# Patient Record
Sex: Female | Born: 1950 | Race: Black or African American | Hispanic: No | Marital: Single | Smoking: Never smoker
Health system: Southern US, Community
[De-identification: ages and names within clinical notes are randomized; demographics above are authoritative.]

## PROBLEM LIST (undated history)

## (undated) DIAGNOSIS — J45909 Unspecified asthma, uncomplicated: Secondary | ICD-10-CM

## (undated) DIAGNOSIS — E119 Type 2 diabetes mellitus without complications: Secondary | ICD-10-CM

## (undated) DIAGNOSIS — D8689 Sarcoidosis of other sites: Secondary | ICD-10-CM

## (undated) DIAGNOSIS — I639 Cerebral infarction, unspecified: Secondary | ICD-10-CM

## (undated) HISTORY — DX: Cerebral infarction, unspecified: I63.9

## (undated) HISTORY — DX: Sarcoidosis of other sites: D86.89

---

## 1996-02-04 HISTORY — PX: PARTIAL HYSTERECTOMY: SHX80

## 2013-02-03 HISTORY — PX: REPLACEMENT TOTAL KNEE: SUR1224

## 2015-04-10 ENCOUNTER — Encounter (HOSPITAL_COMMUNITY): Payer: Self-pay | Admitting: Emergency Medicine

## 2015-04-10 ENCOUNTER — Emergency Department (HOSPITAL_COMMUNITY): Payer: Medicare (Managed Care)

## 2015-04-10 ENCOUNTER — Inpatient Hospital Stay (HOSPITAL_COMMUNITY)
Admission: EM | Admit: 2015-04-10 | Discharge: 2015-04-14 | DRG: 871 | Disposition: A | Discharge status: Home / Self Care | Payer: Medicare (Managed Care) | Attending: Internal Medicine | Admitting: Internal Medicine

## 2015-04-10 DIAGNOSIS — B9620 Unspecified Escherichia coli [E. coli] as the cause of diseases classified elsewhere: Secondary | ICD-10-CM | POA: Diagnosis present

## 2015-04-10 DIAGNOSIS — E119 Type 2 diabetes mellitus without complications: Secondary | ICD-10-CM | POA: Diagnosis present

## 2015-04-10 DIAGNOSIS — J45909 Unspecified asthma, uncomplicated: Secondary | ICD-10-CM | POA: Diagnosis present

## 2015-04-10 DIAGNOSIS — R110 Nausea: Secondary | ICD-10-CM | POA: Diagnosis present

## 2015-04-10 DIAGNOSIS — I9589 Other hypotension: Secondary | ICD-10-CM

## 2015-04-10 DIAGNOSIS — Z7952 Long term (current) use of systemic steroids: Secondary | ICD-10-CM

## 2015-04-10 DIAGNOSIS — R740 Nonspecific elevation of levels of transaminase and lactic acid dehydrogenase [LDH]: Secondary | ICD-10-CM

## 2015-04-10 DIAGNOSIS — J1100 Influenza due to unidentified influenza virus with unspecified type of pneumonia: Secondary | ICD-10-CM | POA: Diagnosis present

## 2015-04-10 DIAGNOSIS — R7401 Elevation of levels of liver transaminase levels: Secondary | ICD-10-CM

## 2015-04-10 DIAGNOSIS — A4189 Other specified sepsis: Secondary | ICD-10-CM | POA: Diagnosis present

## 2015-04-10 DIAGNOSIS — I10 Essential (primary) hypertension: Secondary | ICD-10-CM | POA: Diagnosis present

## 2015-04-10 DIAGNOSIS — N390 Urinary tract infection, site not specified: Secondary | ICD-10-CM | POA: Diagnosis present

## 2015-04-10 DIAGNOSIS — N179 Acute kidney failure, unspecified: Secondary | ICD-10-CM | POA: Diagnosis present

## 2015-04-10 DIAGNOSIS — E785 Hyperlipidemia, unspecified: Secondary | ICD-10-CM | POA: Diagnosis present

## 2015-04-10 DIAGNOSIS — R0602 Shortness of breath: Secondary | ICD-10-CM | POA: Diagnosis present

## 2015-04-10 DIAGNOSIS — A419 Sepsis, unspecified organism: Secondary | ICD-10-CM | POA: Diagnosis not present

## 2015-04-10 DIAGNOSIS — Z6841 Body Mass Index (BMI) 40.0 and over, adult: Secondary | ICD-10-CM

## 2015-04-10 DIAGNOSIS — R197 Diarrhea, unspecified: Secondary | ICD-10-CM | POA: Diagnosis present

## 2015-04-10 DIAGNOSIS — J4521 Mild intermittent asthma with (acute) exacerbation: Secondary | ICD-10-CM | POA: Diagnosis not present

## 2015-04-10 DIAGNOSIS — J189 Pneumonia, unspecified organism: Secondary | ICD-10-CM | POA: Diagnosis present

## 2015-04-10 DIAGNOSIS — R410 Disorientation, unspecified: Secondary | ICD-10-CM

## 2015-04-10 DIAGNOSIS — I959 Hypotension, unspecified: Secondary | ICD-10-CM | POA: Diagnosis present

## 2015-04-10 DIAGNOSIS — Z7984 Long term (current) use of oral hypoglycemic drugs: Secondary | ICD-10-CM

## 2015-04-10 DIAGNOSIS — J45901 Unspecified asthma with (acute) exacerbation: Secondary | ICD-10-CM | POA: Diagnosis present

## 2015-04-10 DIAGNOSIS — G9340 Encephalopathy, unspecified: Secondary | ICD-10-CM | POA: Diagnosis present

## 2015-04-10 HISTORY — DX: Unspecified asthma, uncomplicated: J45.909

## 2015-04-10 HISTORY — DX: Type 2 diabetes mellitus without complications: E11.9

## 2015-04-10 LAB — CBC WITH DIFFERENTIAL/PLATELET
Basophils Absolute: 0.1 10*3/uL (ref 0.0–0.1)
Basophils Relative: 1 %
EOS ABS: 0.1 10*3/uL (ref 0.0–0.7)
EOS PCT: 2 %
HCT: 37.8 % (ref 36.0–46.0)
Hemoglobin: 12.6 g/dL (ref 12.0–15.0)
LYMPHS PCT: 24 %
Lymphs Abs: 1.6 10*3/uL (ref 0.7–4.0)
MCH: 30.6 pg (ref 26.0–34.0)
MCHC: 33.3 g/dL (ref 30.0–36.0)
MCV: 91.7 fL (ref 78.0–100.0)
MONO ABS: 1 10*3/uL (ref 0.1–1.0)
Monocytes Relative: 14 %
NEUTROS PCT: 59 %
Neutro Abs: 4 10*3/uL (ref 1.7–7.7)
Platelets: 223 10*3/uL (ref 150–400)
RBC: 4.12 MIL/uL (ref 3.87–5.11)
RDW: 12.9 % (ref 11.5–15.5)
WBC: 6.8 10*3/uL (ref 4.0–10.5)

## 2015-04-10 LAB — LACTIC ACID, PLASMA: Lactic Acid, Venous: 1.7 mmol/L (ref 0.5–2.0)

## 2015-04-10 LAB — URINALYSIS, ROUTINE W REFLEX MICROSCOPIC
BILIRUBIN URINE: NEGATIVE
GLUCOSE, UA: NEGATIVE mg/dL
HGB URINE DIPSTICK: NEGATIVE
Ketones, ur: NEGATIVE mg/dL
Nitrite: NEGATIVE
PH: 7 (ref 5.0–8.0)
Protein, ur: NEGATIVE mg/dL
SPECIFIC GRAVITY, URINE: 1.01 (ref 1.005–1.030)

## 2015-04-10 LAB — COMPREHENSIVE METABOLIC PANEL
ALT: 72 U/L — AB (ref 14–54)
AST: 78 U/L — ABNORMAL HIGH (ref 15–41)
Albumin: 3.5 g/dL (ref 3.5–5.0)
Alkaline Phosphatase: 159 U/L — ABNORMAL HIGH (ref 38–126)
Anion gap: 6 (ref 5–15)
BUN: 15 mg/dL (ref 6–20)
CALCIUM: 7.7 mg/dL — AB (ref 8.9–10.3)
CHLORIDE: 101 mmol/L (ref 101–111)
CO2: 28 mmol/L (ref 22–32)
CREATININE: 1.68 mg/dL — AB (ref 0.44–1.00)
GFR, EST AFRICAN AMERICAN: 36 mL/min — AB (ref >60)
GFR, EST NON AFRICAN AMERICAN: 31 mL/min — AB (ref >60)
Glucose, Bld: 91 mg/dL (ref 65–99)
Potassium: 3.6 mmol/L (ref 3.5–5.1)
Sodium: 135 mmol/L (ref 135–145)
Total Bilirubin: 1.1 mg/dL (ref 0.3–1.2)
Total Protein: 6.7 g/dL (ref 6.5–8.1)

## 2015-04-10 LAB — PROTIME-INR
INR: 1.13 (ref 0.00–1.49)
PROTHROMBIN TIME: 14.7 s (ref 11.6–15.2)

## 2015-04-10 LAB — I-STAT CG4 LACTIC ACID, ED
LACTIC ACID, VENOUS: 1.22 mmol/L (ref 0.5–2.0)
LACTIC ACID, VENOUS: 1.58 mmol/L (ref 0.5–2.0)

## 2015-04-10 LAB — I-STAT TROPONIN, ED: Troponin i, poc: 0.01 ng/mL (ref 0.00–0.08)

## 2015-04-10 LAB — URINE MICROSCOPIC-ADD ON

## 2015-04-10 LAB — PROCALCITONIN: Procalcitonin: 0.35 ng/mL

## 2015-04-10 LAB — APTT: aPTT: 36 seconds (ref 24–37)

## 2015-04-10 LAB — BRAIN NATRIURETIC PEPTIDE: B Natriuretic Peptide: 78.8 pg/mL (ref 0.0–100.0)

## 2015-04-10 LAB — CBG MONITORING, ED: Glucose-Capillary: 75 mg/dL (ref 65–99)

## 2015-04-10 MED ORDER — DEXTROSE 5 % IV SOLN
500.0000 mg | Freq: Once | INTRAVENOUS | Status: AC
  Administered 2015-04-10: 500 mg via INTRAVENOUS
  Filled 2015-04-10: qty 500

## 2015-04-10 MED ORDER — SODIUM CHLORIDE 0.9 % IV BOLUS (SEPSIS)
1000.0000 mL | INTRAVENOUS | Status: AC
  Administered 2015-04-10 (×2): 1000 mL via INTRAVENOUS

## 2015-04-10 MED ORDER — ALBUTEROL SULFATE (2.5 MG/3ML) 0.083% IN NEBU
2.5000 mg | INHALATION_SOLUTION | Freq: Four times a day (QID) | RESPIRATORY_TRACT | Status: DC
  Administered 2015-04-10: 2.5 mg via RESPIRATORY_TRACT
  Filled 2015-04-10: qty 3

## 2015-04-10 MED ORDER — DEXTROSE 5 % IV SOLN
500.0000 mg | INTRAVENOUS | Status: DC
  Administered 2015-04-11 – 2015-04-13 (×3): 500 mg via INTRAVENOUS
  Filled 2015-04-10 (×3): qty 500

## 2015-04-10 MED ORDER — DEXTROSE 5 % IV SOLN
1.0000 g | INTRAVENOUS | Status: DC
  Administered 2015-04-11 – 2015-04-13 (×3): 1 g via INTRAVENOUS
  Filled 2015-04-10 (×3): qty 10

## 2015-04-10 MED ORDER — SODIUM CHLORIDE 0.9 % IV BOLUS (SEPSIS)
500.0000 mL | Freq: Once | INTRAVENOUS | Status: AC
  Administered 2015-04-10: 500 mL via INTRAVENOUS

## 2015-04-10 MED ORDER — ALBUTEROL SULFATE (2.5 MG/3ML) 0.083% IN NEBU
2.5000 mg | INHALATION_SOLUTION | RESPIRATORY_TRACT | Status: DC | PRN
  Administered 2015-04-11: 2.5 mg via RESPIRATORY_TRACT

## 2015-04-10 MED ORDER — SODIUM CHLORIDE 0.9 % IV BOLUS (SEPSIS)
500.0000 mL | INTRAVENOUS | Status: AC
  Administered 2015-04-10: 500 mL via INTRAVENOUS

## 2015-04-10 MED ORDER — DEXTROSE 5 % IV SOLN
1.0000 g | Freq: Once | INTRAVENOUS | Status: AC
  Administered 2015-04-10: 1 g via INTRAVENOUS
  Filled 2015-04-10: qty 10

## 2015-04-10 MED ORDER — ACETAMINOPHEN 325 MG PO TABS
650.0000 mg | ORAL_TABLET | Freq: Once | ORAL | Status: AC
  Administered 2015-04-10: 650 mg via ORAL
  Filled 2015-04-10: qty 2

## 2015-04-10 NOTE — ED Notes (Signed)
Per EMs. Pt from home. Reports flu-like symptoms, n/v/d and cough for the past 4 days. Pt also had a fall this am when walking back from the bathroom. Pt denied any complaints with EMS, but was disoriented to time, which was unusual for her. Pt had expiratory wheezing with EMS. EMS gave albuterol and atrovent neb along with 500 NS.

## 2015-04-10 NOTE — H&P (Signed)
[facility] Admission History and Physical       Darlene Stafford [id number] DOB: 1950-12-31 DOA: 04/10/2015  Referring physician: EDP PCP: No primary care provider on file.  Specialists:   Chief Complaint: Fever Chills Cough  HPI: Darlene Stafford is a 65 y.o. female with a history of Asthma, DM2 who presents to the ED with complaints of 4 days of SOB, Cough, Fevers,  Chills,  and Myalgias, and Chest Congestion and Wheezing.   She has also had Nausea Vomitng and Diarrhea She had a fever to 102.6 in the ED,  She was found to have a multifocal pneumonia on Chest X-ray.   She was placed on IV Rocephin and Azithromycin for CAP pneumonia and Flu studies were sent.     Review of Systems:    Constitutional: No Weight Loss, No Weight Gain, Night Sweats, +Fevers, Chills, Dizziness, + Myalgias, +Light Headedness, Fatigue, or Generalized Weakness HEENT: No Headaches, Difficulty Swallowing,Tooth/Dental Problems,Sore Throat,  No Sneezing, Rhinitis, Ear Ache, Nasal Congestion, or Post Nasal Drip,  Cardio-vascular:  No Chest pain, Orthopnea, PND, Edema in Lower Extremities, Anasarca, Dizziness, Palpitations  Resp: No Dyspnea, No DOE, No Productive Cough,  +Non-Productive Cough, No Hemoptysis, No Wheezing.    GI: No Heartburn, Indigestion, Abdominal Pain, +Nausea, Vomiting, Diarrhea, Constipation, Hematemesis, Hematochezia, Melena, Change in Bowel Habits,  Loss of Appetite  GU: No Dysuria, No Change in Color of Urine, No Urgency or Urinary Frequency, No Flank pain.  Musculoskeletal: No Joint Pain or Swelling, No Decreased Range of Motion, No Back Pain.  Neurologic: No Syncope, No Seizures, Muscle Weakness, Paresthesia, Vision Disturbance or Loss, No Diplopia, No Vertigo, No Difficulty Walking,  Skin: No Rash or Lesions. Psych: No Change in Mood or Affect, No Depression or Anxiety, No Memory loss, No Confusion, or Hallucinations   Past Medical History  Diagnosis Date  . Asthma   . Diabetes  mellitus without complication ([place])      History reviewed. No pertinent past surgical history.    Prior to Admission medications   Medication Sig Start Date End Date Taking? Authorizing Provider  albuterol (PROVENTIL HFA;VENTOLIN HFA) 108 (90 Base) MCG/ACT inhaler Inhale 2 puffs into the lungs 2 (two) times daily as needed for wheezing or shortness of breath.   Yes Historical Provider, MD  atorvastatin (LIPITOR) 40 MG tablet Take 40 mg by mouth daily.   Yes Historical Provider, MD  carvedilol (COREG) 12.5 MG tablet Take 12.5 mg by mouth 2 (two) times daily with a meal.   Yes Historical Provider, MD  escitalopram (LEXAPRO) 10 MG tablet Take 10 mg by mouth daily.   Yes Historical Provider, MD  LORazepam (ATIVAN) 0.5 MG tablet Take 0.5 mg by mouth 2 (two) times daily as needed for anxiety.   Yes Historical Provider, MD  predniSONE (DELTASONE) 5 MG tablet Take 5 mg by mouth daily with breakfast.   Yes Historical Provider, MD  sitaGLIPtin (JANUVIA) 100 MG tablet Take 100 mg by mouth daily.   Yes Historical Provider, MD  traMADol (ULTRAM) 50 MG tablet Take 50-100 mg by mouth every 6 (six) hours as needed (pain.).   Yes Historical Provider, MD     No Known Allergies     Social History:  reports that she has never smoked. She does not have any smokeless tobacco history on file. She reports that she does not drink alcohol or use illicit drugs.     History reviewed. No pertinent family history.     Physical Exam:  GEN:  Pleasant Obese  65 y.o. African American female examined and in no acute distress; cooperative with exam Filed Vitals:   04/10/15 1657 04/10/15 1721 04/10/15 2000 04/10/15 2046  BP:  184/99 100/56 100/56  Pulse:  104 91 93  Temp:      TempSrc:      Resp:  25 22 25   Weight: 68.04 kg (150 lb)     SpO2:  98% 98% 94%   Blood pressure 100/56, pulse 93, temperature 102.6 F (39.2 C), temperature source Rectal, resp. rate 25, weight 68.04 kg (150 lb), SpO2 94 %. PSYCH:  Sh e is alert and oriented x4; does not appear anxious does not appear depressed; affect is normal HEENT: Normocephalic and Atraumatic, Mucous membranes pink; PERRLA; EOM intact; Fundi:  Benign;  No scleral icterus, Nares: Patent, Oropharynx: Clear, Fair Dentition,    Neck:  FROM, No Cervical Lymphadenopathy nor Thyromegaly or Carotid Bruit; No JVD; Breasts:: Not examined CHEST WALL: No tenderness CHEST: Normal respiration, clear to auscultation bilaterally HEART: Regular rate and rhythm; no murmurs rubs or gallops BACK: No kyphosis or scoliosis; No CVA tenderness ABDOMEN: Positive Bowel Sounds, Obese, Soft Non-Tender, No Rebound or Guarding; No Masses, No Organomegaly, No Pannus; No Intertriginous candida. Rectal Exam: Not done EXTREMITIES: No  Cyanosis, Clubbing, or Edema; No Ulcerations. Genitalia: not examined PULSES: 2+ and symmetric SKIN: Normal hydration no rash or ulceration CNS:  Alert and Oriented x 4, No Focal Deficits Vascular: pulses palpable throughout    Labs on Admission:  Basic Metabolic Panel:  Recent Labs Lab 04/10/15 1726  NA 135  K 3.6  CL 101  CO2 28  GLUCOSE 91  BUN 15  CREATININE 1.68*  CALCIUM 7.7*   Liver Function Tests:  Recent Labs Lab 04/10/15 1726  AST 78*  ALT 72*  ALKPHOS 159*  BILITOT 1.1  PROT 6.7  ALBUMIN 3.5   No results for input(s): LIPASE, AMYLASE in the last 168 hours. No results for input(s): AMMONIA in the last 168 hours. CBC:  Recent Labs Lab 04/10/15 1726  WBC 6.8  NEUTROABS 4.0  HGB 12.6  HCT 37.8  MCV 91.7  PLT 223   Cardiac Enzymes: No results for input(s): CKTOTAL, CKMB, CKMBINDEX, TROPONINI in the last 168 hours.  BNP (last 3 results)  Recent Labs  04/10/15 1726  BNP 78.8    ProBNP (last 3 results) No results for input(s): PROBNP in the last 8760 hours.  CBG:  Recent Labs Lab 04/10/15 1801  GLUCAP 75    Radiological Exams on Admission: Dg Chest Port 1 View  04/10/2015  CLINICAL DATA:   Wheezing and cough for past 4 days. Asthma. Diabetes. EXAM: PORTABLE CHEST 1 VIEW COMPARISON:  None. FINDINGS: Low lung volumes are seen. Coarse heterogeneous pulmonary opacity is seen throughout the right lung and to a lesser degree at the left lung base. This could be due to acute infectious or inflammatory process although chronic lung disease cannot definitely be excluded without prior exams for comparison. No evidence of pleural effusion. Heart size is within normal limits allowing for low lung volumes. IMPRESSION: Low lung volumes with coarse bilateral pulmonary opacity involving right lung greater than left. Differential diagnosis includes an acute infectious or inflammatory process versus chronic lung disease. Consider chest CT for further evaluation if there are no prior outside radiographs available for comparison. Electronically Signed   By: Earle Gell M.D.   On: 04/10/2015 17:26     EKG: Independently reviewed. Sinus Tachycardia, rate =103,  +RBBB  Assessment/Plan:      65 y.o. female with  Active Problems:     CAP (community acquired pneumonia)     IV Rocephin and Azithromycin     IVFs     Albuterol Nebs PRN     O2 PRN     Sepsis     IV Abxs and IVFs     Sepsis Protocol     SDU Montioring     Hypotension      IVFs       Acute encephalopathy- due to Sepsis/CAP/UTI     Monitor       UTI (lower urinary tract infection)     Covered by Rocephin     Urine C+S sent       AKI (acute kidney injury) ([place])     IVFs     Monitor BUN/Cr    Diabetes mellitus without complication (HCC)     SSI coverage PRN     Check HbA1C       Asthma     Albuterol Nebs      O2 PRN       DVT Prophylaxis     Lovenox              Code Status:     FULL CODE        Family Communication:   Family at Bedside   No Family Present    Disposition Plan:    Inpatient  Observation Status        Time spent: 36 Minutes      Theressa Millard [facility] Pager  [phone]   If 7AM -7PM Please Contact the Day Rounding Team MD for [facility]  If 7PM-7AM, Please Contact Night-Floor Coverage  [email or web address] Password [facility] 04/10/2015, 8:47 PM     ADDENDUM:   Patient was seen and examined on 04/10/2015

## 2015-04-10 NOTE — Progress Notes (Addendum)
Patient listed as not having insurance or a pcp.  EDCM spoke to patient at bedside.  Patient reports she has Medicaid and [place].  Patient reports  She used to live closer to [facility] and is now looking for a pcp here in [place]. Patient reports her pcp is Dr. Owens Shark in [place]. [facility] informed patient that she must call the DSS to transfer her Medicaid from the other county.  [facility] provided [facility] a list of pcps who accept Medicaid insurance in [place].  EDCM also provided phone number for the DSS and for Medicaid transport.  Patient and her family thankful for services.  No further EDCM needs at this time.  EDCM notified registration regarding patient's insurance.

## 2015-04-10 NOTE — ED Provider Notes (Signed)
CSN: [id number]     Arrival date & time 04/10/15  1634 History   First MD Initiated Contact with Patient 04/10/15 1644     Chief Complaint  Patient presents with  . Altered Mental Status  . Emesis  . Diarrhea     (Consider location/radiation/quality/duration/timing/severity/associated sxs/prior Treatment) The history is provided by the EMS personnel. No language interpreter was used.     Darlene Stafford is a(n) 65 y.o. female who presents via EMS for AMS.There is a level V Twice a day to altered mental status. Patient called EMS to her house because the patient fell walking from her bed to the bathroom and could not get back up. History is given by EMS. They found the patient on the floor and she is unable to get herself up. She is also found to be tachycardic and hypotensive. Patient is alert and oriented to self only. EMS also found patient to be wheezing. She was given one albuterol treatment on the way here. She is also given 500 mL's of fluid. The patient is states that she does have a history of diabetes. She states that she has not been feeling well lately and has had flulike symptoms for several days. She is unable to give me past medical history.  No past medical history on file. No past surgical history on file. No family history on file. Social History  Substance Use Topics  . Smoking status: Not on file  . Smokeless tobacco: Not on file  . Alcohol Use: Not on file   OB History    No data available     Review of Systems  Unable to review systems due to altered mental status  Allergies  Review of patient's allergies indicates not on file.  Home Medications   Prior to Admission medications   Not on File   There were no vitals taken for this visit. Physical Exam  Constitutional: She appears well-developed and well-nourished.  Morbidly obese female in no distress complaining of being cold.  HENT:  Head: Normocephalic and atraumatic.  Eyes: EOM are normal. Pupils are  equal, round, and reactive to light.  Neck: Normal range of motion. JVD present.  Cardiovascular:  Tachycardic  Pulmonary/Chest: Effort normal. She has wheezes.  Abdominal: Soft. She exhibits no distension and no mass. There is no tenderness.  Musculoskeletal: Normal range of motion.  Neurological: She is alert.  Nursing note and vitals reviewed.   ED Course  Procedures (including critical care time) Labs Review Labs Reviewed  CULTURE, BLOOD (ROUTINE X 2)  CULTURE, BLOOD (ROUTINE X 2)  URINE CULTURE  CBC WITH DIFFERENTIAL/PLATELET  COMPREHENSIVE METABOLIC PANEL  URINALYSIS, ROUTINE W REFLEX MICROSCOPIC (NOT AT [facility])  BRAIN NATRIURETIC PEPTIDE  INFLUENZA PANEL BY PCR (TYPE A & B, H1N1)  I-STAT CG4 LACTIC ACID, ED  I-STAT TROPOININ, ED  CBG MONITORING, ED    Imaging Review No results found. I have personally reviewed and evaluated these images and lab results as part of my medical decision-making.   EKG Interpretation None      MDM   Final diagnoses:  CAP (community acquired pneumonia)  Disorientation  UTI (lower urinary tract infection)  Transaminitis    5:56 PM BP 184/99 mmHg  Pulse 104  Temp(Src) 102.6 F (39.2 C) (Rectal)  Resp 25  Wt 68.04 kg  SpO2 98% Patient with normal lactate. She is febrile and tachycardic. Blood pressure is elevated here. I ordered Tylenol. We'll treat for community-acquired pneumonia. She is receiving weight-based fluid  resuscitation at 30 mL per kilo. Patient will need admission.  7:18 PM BP 184/99 mmHg  Pulse 104  Temp(Src) 102.6 F (39.2 C) (Rectal)  Resp 25  Wt 68.04 kg  SpO2 98% Patient +for UTI. She has received Rocephin which should help cover her UTI. Radiologist reccomends a CT chest. Patient will be admitted for altered mental status and community-acquired pneumonia along with urinary tract infection. Her pulses and pressures are improved. Oxygen saturation 95% on 2 L. Fever, treated with oral Tylenol. Patient's  mentation seems to be improving. Family is at bedside and agrees with admission. 8. In shared visit with Dr. Ralene Bathe.  Margarita Mail, PA-C 04/11/15 1235  Quintella Reichert, MD 04/13/15 1023

## 2015-04-10 NOTE — ED Notes (Signed)
Bed: WA06 Expected date:  Expected time:  Means of arrival:  Comments: EMS- AMS

## 2015-04-11 ENCOUNTER — Encounter (HOSPITAL_COMMUNITY): Payer: Self-pay | Admitting: *Deleted

## 2015-04-11 DIAGNOSIS — J1100 Influenza due to unidentified influenza virus with unspecified type of pneumonia: Secondary | ICD-10-CM

## 2015-04-11 DIAGNOSIS — J4521 Mild intermittent asthma with (acute) exacerbation: Secondary | ICD-10-CM

## 2015-04-11 DIAGNOSIS — E1165 Type 2 diabetes mellitus with hyperglycemia: Secondary | ICD-10-CM

## 2015-04-11 DIAGNOSIS — I10 Essential (primary) hypertension: Secondary | ICD-10-CM

## 2015-04-11 DIAGNOSIS — A419 Sepsis, unspecified organism: Secondary | ICD-10-CM

## 2015-04-11 LAB — GLUCOSE, CAPILLARY
Glucose-Capillary: 58 mg/dL — ABNORMAL LOW (ref 65–99)
Glucose-Capillary: 66 mg/dL (ref 65–99)
Glucose-Capillary: 84 mg/dL (ref 65–99)
Glucose-Capillary: 120 mg/dL — ABNORMAL HIGH (ref 65–99)
Glucose-Capillary: 65 mg/dL (ref 65–99)
Glucose-Capillary: 103 mg/dL — ABNORMAL HIGH (ref 65–99)

## 2015-04-11 LAB — CBC
HCT: 37.2 % (ref 36.0–46.0)
Hemoglobin: 12.1 g/dL (ref 12.0–15.0)
MCH: 30 pg (ref 26.0–34.0)
MCHC: 32.5 g/dL (ref 30.0–36.0)
MCV: 92.3 fL (ref 78.0–100.0)
PLATELETS: 205 10*3/uL (ref 150–400)
RBC: 4.03 MIL/uL (ref 3.87–5.11)
RDW: 13 % (ref 11.5–15.5)
WBC: 5 10*3/uL (ref 4.0–10.5)

## 2015-04-11 LAB — BASIC METABOLIC PANEL
Anion gap: 9 (ref 5–15)
BUN: 14 mg/dL (ref 6–20)
CALCIUM: 7.5 mg/dL — AB (ref 8.9–10.3)
CHLORIDE: 104 mmol/L (ref 101–111)
CO2: 24 mmol/L (ref 22–32)
CREATININE: 1.49 mg/dL — AB (ref 0.44–1.00)
GFR calc non Af Amer: 36 mL/min — ABNORMAL LOW (ref >60)
GFR, EST AFRICAN AMERICAN: 42 mL/min — AB (ref >60)
Glucose, Bld: 84 mg/dL (ref 65–99)
Potassium: 3.4 mmol/L — ABNORMAL LOW (ref 3.5–5.1)
SODIUM: 137 mmol/L (ref 135–145)

## 2015-04-11 LAB — INFLUENZA PANEL BY PCR (TYPE A & B)
H1N1FLUPCR: DETECTED — AB
INFLBPCR: NEGATIVE
Influenza A By PCR: POSITIVE — AB

## 2015-04-11 LAB — LACTIC ACID, PLASMA: Lactic Acid, Venous: 0.8 mmol/L (ref 0.5–2.0)

## 2015-04-11 MED ORDER — LORAZEPAM 0.5 MG PO TABS: 0.5000 mg | ORAL_TABLET | Freq: Two times a day (BID) | ORAL | Status: DC | PRN

## 2015-04-11 MED ORDER — ALBUTEROL SULFATE (2.5 MG/3ML) 0.083% IN NEBU
2.5000 mg | INHALATION_SOLUTION | Freq: Three times a day (TID) | RESPIRATORY_TRACT | Status: DC
  Administered 2015-04-11 – 2015-04-14 (×9): 2.5 mg via RESPIRATORY_TRACT
  Filled 2015-04-11 (×10): qty 3

## 2015-04-11 MED ORDER — HYDROMORPHONE HCL 1 MG/ML IJ SOLN: 0.5000 mg | INTRAMUSCULAR | Status: DC | PRN

## 2015-04-11 MED ORDER — TRAMADOL HCL 50 MG PO TABS: 50.0000 mg | ORAL_TABLET | Freq: Four times a day (QID) | ORAL | Status: DC | PRN

## 2015-04-11 MED ORDER — ENOXAPARIN SODIUM 40 MG/0.4ML ~~LOC~~ SOLN
40.0000 mg | SUBCUTANEOUS | Status: DC
  Administered 2015-04-11: 40 mg via SUBCUTANEOUS
  Filled 2015-04-11: qty 0.4

## 2015-04-11 MED ORDER — INSULIN ASPART 100 UNIT/ML ~~LOC~~ SOLN: 0.0000 [IU] | Freq: Every day | SUBCUTANEOUS | Status: DC

## 2015-04-11 MED ORDER — ALUM & MAG HYDROXIDE-SIMETH 200-200-20 MG/5ML PO SUSP: 30.0000 mL | Freq: Four times a day (QID) | ORAL | Status: DC | PRN

## 2015-04-11 MED ORDER — ACETAMINOPHEN 650 MG RE SUPP: 650.0000 mg | Freq: Four times a day (QID) | RECTAL | Status: DC | PRN

## 2015-04-11 MED ORDER — BUDESONIDE 0.25 MG/2ML IN SUSP
0.2500 mg | Freq: Two times a day (BID) | RESPIRATORY_TRACT | Status: DC
  Administered 2015-04-12 – 2015-04-14 (×5): 0.25 mg via RESPIRATORY_TRACT
  Filled 2015-04-11 (×6): qty 2

## 2015-04-11 MED ORDER — ACETAMINOPHEN 325 MG PO TABS
650.0000 mg | ORAL_TABLET | Freq: Four times a day (QID) | ORAL | Status: DC | PRN
  Administered 2015-04-11: 650 mg via ORAL
  Filled 2015-04-11: qty 2

## 2015-04-11 MED ORDER — INSULIN ASPART 100 UNIT/ML ~~LOC~~ SOLN
0.0000 [IU] | Freq: Three times a day (TID) | SUBCUTANEOUS | Status: DC
  Administered 2015-04-12 (×2): 2 [IU] via SUBCUTANEOUS
  Administered 2015-04-13: 5 [IU] via SUBCUTANEOUS
  Administered 2015-04-13 – 2015-04-14 (×4): 2 [IU] via SUBCUTANEOUS

## 2015-04-11 MED ORDER — ONDANSETRON HCL 4 MG PO TABS: 4.0000 mg | ORAL_TABLET | Freq: Four times a day (QID) | ORAL | Status: DC | PRN

## 2015-04-11 MED ORDER — ONDANSETRON HCL 4 MG/2ML IJ SOLN
4.0000 mg | Freq: Four times a day (QID) | INTRAMUSCULAR | Status: DC | PRN
  Administered 2015-04-11: 4 mg via INTRAVENOUS
  Filled 2015-04-11: qty 2

## 2015-04-11 MED ORDER — SODIUM CHLORIDE 0.9 % IV BOLUS (SEPSIS)
500.0000 mL | Freq: Once | INTRAVENOUS | Status: AC
Start: 2015-04-11 — End: 2015-04-11
  Administered 2015-04-11: 500 mL via INTRAVENOUS

## 2015-04-11 MED ORDER — OXYCODONE HCL 5 MG PO TABS
5.0000 mg | ORAL_TABLET | ORAL | Status: DC | PRN
  Filled 2015-04-11: qty 1

## 2015-04-11 MED ORDER — ESCITALOPRAM OXALATE 10 MG PO TABS
10.0000 mg | ORAL_TABLET | Freq: Every day | ORAL | Status: DC
  Administered 2015-04-11 – 2015-04-14 (×4): 10 mg via ORAL
  Filled 2015-04-11 (×4): qty 1

## 2015-04-11 MED ORDER — CARVEDILOL 12.5 MG PO TABS
12.5000 mg | ORAL_TABLET | Freq: Two times a day (BID) | ORAL | Status: DC
  Administered 2015-04-11 – 2015-04-14 (×7): 12.5 mg via ORAL
  Filled 2015-04-11 (×7): qty 1

## 2015-04-11 MED ORDER — CETYLPYRIDINIUM CHLORIDE 0.05 % MT LIQD
7.0000 mL | Freq: Two times a day (BID) | OROMUCOSAL | Status: DC
  Administered 2015-04-11 – 2015-04-14 (×7): 7 mL via OROMUCOSAL

## 2015-04-11 MED ORDER — GUAIFENESIN ER 600 MG PO TB12
600.0000 mg | ORAL_TABLET | Freq: Two times a day (BID) | ORAL | Status: DC
  Administered 2015-04-11 – 2015-04-14 (×7): 600 mg via ORAL
  Filled 2015-04-11 (×6): qty 1

## 2015-04-11 MED ORDER — SODIUM CHLORIDE 0.9 % IV SOLN
INTRAVENOUS | Status: AC
  Administered 2015-04-11: 05:00:00 via INTRAVENOUS

## 2015-04-11 MED ORDER — METHYLPREDNISOLONE SODIUM SUCC 40 MG IJ SOLR
40.0000 mg | Freq: Three times a day (TID) | INTRAMUSCULAR | Status: DC
  Administered 2015-04-11 – 2015-04-13 (×6): 40 mg via INTRAVENOUS
  Filled 2015-04-11 (×6): qty 1

## 2015-04-11 MED ORDER — ATORVASTATIN CALCIUM 40 MG PO TABS
40.0000 mg | ORAL_TABLET | Freq: Every day | ORAL | Status: DC
  Administered 2015-04-11 – 2015-04-13 (×3): 40 mg via ORAL
  Filled 2015-04-11 (×3): qty 1

## 2015-04-11 NOTE — Progress Notes (Signed)
[facility] PROGRESS NOTE  Darlene Stafford [id number] DOB: Jan 24, 1951 DOA: 04/10/2015 PCP: No primary care provider on file.  Assessment/Plan: 1-sepsis: Present on admission (temperature of 102.6, heart rate 113 and respiratory rate 28). Appears to be secondary to community-acquired pneumonia and influenza. -Will continue current antibiotic therapy -Continue supportive care and oxygen supplementation -As needed nebulizer  -started on Tamiflu -When necessary antipyretics and analgesics  -Patient still spiking fever, heart rate has now improved to within normal limits. Normal WBCs and normal blood pressure.   2-asthma exacerbation: Most likely secondary to pneumonia and influenza infection -Will initiate treatment with Pulmicort and as needed DuoNeb -Flutter Valve and steroids  -continue oxygen supplementation (weaning as tolerated) -Continue antibiotic and Tamiflu as mentioned above  3-fever/diarrhea: Most likely associated with influenza -Will provide supportive care and replete electrolytes as needed -PRN antipyretics -continue tamiflu -will check for C. Diff if diarrhea continues   4-1/2 positive blood culture: Gram-positive rods -Most likely contaminant -Will follow speciation and sensitivity -Continue current antibiotics -Patient currently nontoxic and exam  5-hypertension: -Will continue coreg  6-diabetes:  -will follow a1C -holding oral hypoglycemics -will use SSI while inpatient  7-obesity:  -Body mass index is 42.32 kg/(m^2). -Low calorie diet and increase exercise discussed with patient   8-hyperlipidemia: Continue Lipitor    Code Status: Full code Family Communication: Brother at bedside Disposition Plan: Remains inpatient, and glucose and data, continue treatment with antibiotics and Tamiflu; started on steroids for asthma exacerbation.   Consultants:  None   Procedures:  See below for x-ray reports  Antibiotics:  Zithromax  04/10/15  Rocephin 04/10/15  Tamiflu 04/10/15  HPI/Subjective: Still spiking fevers, with diffuse ongoing wheezing and unable to speak in full sentences. Denies chest pain.  Objective: Filed Vitals:   04/11/15 1000 04/11/15 1500  BP: 178/81 164/60  Pulse: 95 76  Temp:  100.8 F (38.2 C)  Resp:  20    Intake/Output Summary (Last 24 hours) at 04/11/15 1830 Last data filed at 04/11/15 1500  Gross per 24 hour  Intake    465 ml  Output      0 ml  Net    465 ml   Filed Weights   04/10/15 1657 04/11/15 0427  Weight: 68.04 kg (150 lb) 98.3 kg (216 lb 11.4 oz)    Exam:   General:  Still spiking fever, with mild respiratory distress; requiring oxygen supplementation and with ongoing wheezing. Patient currently unable to speak in full sentences  Cardiovascular: Mild tachycardic, no rubs, no gallops, S1 and S2 appreciate on exam.  Respiratory: Diffuse expiratory wheezing, positive rhonchi, no crackles  Abdomen: Soft, nontender, nondistended, positive bowel sounds  Musculoskeletal: No edema, no cyanosis  Data Reviewed: Basic Metabolic Panel:  Recent Labs Lab 04/10/15 1726 04/11/15 0505  NA 135 137  K 3.6 3.4*  CL 101 104  CO2 28 24  GLUCOSE 91 84  BUN 15 14  CREATININE 1.68* 1.49*  CALCIUM 7.7* 7.5*   Liver Function Tests:  Recent Labs Lab 04/10/15 1726  AST 78*  ALT 72*  ALKPHOS 159*  BILITOT 1.1  PROT 6.7  ALBUMIN 3.5   CBC:  Recent Labs Lab 04/10/15 1726 04/11/15 0505  WBC 6.8 5.0  NEUTROABS 4.0  --   HGB 12.6 12.1  HCT 37.8 37.2  MCV 91.7 92.3  PLT 223 205   BNP (last 3 results)  Recent Labs  04/10/15 1726  BNP 78.8   CBG:  Recent Labs Lab 04/11/15 0749 04/11/15 0837 04/11/15 1242  04/11/15 1745 04/11/15 1826  GLUCAP 58* 66 84 65 120*    Recent Results (from the past 240 hour(s))  Blood Culture (routine x 2)     Status: None (Preliminary result)   Collection Time: 04/10/15  5:26 PM  Result Value Ref Range Status   Specimen  Description BLOOD RIGHT ARM  Final   Special Requests BOTTLES DRAWN AEROBIC AND ANAEROBIC 5CC  Final   Culture  Setup Time   Final    GRAM POSITIVE RODS AEROBIC BOTTLE ONLY CRITICAL RESULT CALLED TO, READ BACK BY AND VERIFIED WITH: Molinda Bailiff AT [id number] 04/11/15 BY L BENFIELD    Culture   Final    GRAM POSITIVE RODS Performed at [facility]    Report Status PENDING  Incomplete  Urine culture     Status: None (Preliminary result)   Collection Time: 04/10/15  5:55 PM  Result Value Ref Range Status   Specimen Description URINE, CATHETERIZED  Final   Special Requests NONE  Final   Culture   Final    CULTURE REINCUBATED FOR BETTER GROWTH Performed at [facility]    Report Status PENDING  Incomplete     Studies: Dg Chest Port 1 View  04/10/2015  CLINICAL DATA:  Wheezing and cough for past 4 days. Asthma. Diabetes. EXAM: PORTABLE CHEST 1 VIEW COMPARISON:  None. FINDINGS: Low lung volumes are seen. Coarse heterogeneous pulmonary opacity is seen throughout the right lung and to a lesser degree at the left lung base. This could be due to acute infectious or inflammatory process although chronic lung disease cannot definitely be excluded without prior exams for comparison. No evidence of pleural effusion. Heart size is within normal limits allowing for low lung volumes. IMPRESSION: Low lung volumes with coarse bilateral pulmonary opacity involving right lung greater than left. Differential diagnosis includes an acute infectious or inflammatory process versus chronic lung disease. Consider chest CT for further evaluation if there are no prior outside radiographs available for comparison. Electronically Signed   By: Earle Gell M.D.   On: 04/10/2015 17:26    Scheduled Meds: . albuterol  2.5 mg Nebulization TID  . antiseptic oral rinse  7 mL Mouth Rinse BID  . atorvastatin  40 mg Oral q1800  . azithromycin  500 mg Intravenous Q24H  . budesonide (PULMICORT) nebulizer solution  0.25 mg  Nebulization BID  . carvedilol  12.5 mg Oral BID WC  . cefTRIAXone (ROCEPHIN)  IV  1 g Intravenous Q24H  . enoxaparin (LOVENOX) injection  40 mg Subcutaneous Q24H  . escitalopram  10 mg Oral Daily  . guaiFENesin  600 mg Oral BID  . insulin aspart  0-5 Units Subcutaneous QHS  . insulin aspart  0-9 Units Subcutaneous TID WC  . methylPREDNISolone (SOLU-MEDROL) injection  40 mg Intravenous Q8H   Continuous Infusions:   Active Problems:   CAP (community acquired pneumonia)   Diabetes mellitus without complication ([place])   Asthma   Acute encephalopathy   UTI (lower urinary tract infection)   AKI (acute kidney injury) ([place])   Sepsis ([place])   Hypotension    Time spent: 42 minutes    Barton Dubois  [facility] Pager [phone]. If 7PM-7AM, please contact night-coverage at [email or web address], password [facility] 04/11/2015, 6:30 PM  LOS: 1 day

## 2015-04-11 NOTE — Progress Notes (Signed)
Came to assess and give neb, pt is being washed/clean at this time per NT/RN. Scheduled neb not given. PRN neb will be given it needed.

## 2015-04-12 DIAGNOSIS — E119 Type 2 diabetes mellitus without complications: Secondary | ICD-10-CM

## 2015-04-12 DIAGNOSIS — J189 Pneumonia, unspecified organism: Secondary | ICD-10-CM

## 2015-04-12 DIAGNOSIS — N179 Acute kidney failure, unspecified: Secondary | ICD-10-CM

## 2015-04-12 LAB — BASIC METABOLIC PANEL
ANION GAP: 9 (ref 5–15)
BUN: 18 mg/dL (ref 6–20)
CALCIUM: 7.7 mg/dL — AB (ref 8.9–10.3)
CHLORIDE: 100 mmol/L — AB (ref 101–111)
CO2: 22 mmol/L (ref 22–32)
CREATININE: 2.11 mg/dL — AB (ref 0.44–1.00)
GFR calc non Af Amer: 24 mL/min — ABNORMAL LOW (ref >60)
GFR, EST AFRICAN AMERICAN: 27 mL/min — AB (ref >60)
GLUCOSE: 129 mg/dL — AB (ref 65–99)
Potassium: 4.1 mmol/L (ref 3.5–5.1)
Sodium: 131 mmol/L — ABNORMAL LOW (ref 135–145)

## 2015-04-12 LAB — GLUCOSE, CAPILLARY
Glucose-Capillary: 120 mg/dL — ABNORMAL HIGH (ref 65–99)
Glucose-Capillary: 158 mg/dL — ABNORMAL HIGH (ref 65–99)
Glucose-Capillary: 164 mg/dL — ABNORMAL HIGH (ref 65–99)
Glucose-Capillary: 173 mg/dL — ABNORMAL HIGH (ref 65–99)

## 2015-04-12 LAB — C DIFFICILE QUICK SCREEN W PCR REFLEX
C Diff antigen: NEGATIVE
C Diff interpretation: NEGATIVE
C Diff toxin: NEGATIVE

## 2015-04-12 LAB — CBC
HEMATOCRIT: 32.1 % — AB (ref 36.0–46.0)
HEMOGLOBIN: 10.6 g/dL — AB (ref 12.0–15.0)
MCH: 30.1 pg (ref 26.0–34.0)
MCHC: 33 g/dL (ref 30.0–36.0)
MCV: 91.2 fL (ref 78.0–100.0)
Platelets: 204 10*3/uL (ref 150–400)
RBC: 3.52 MIL/uL — ABNORMAL LOW (ref 3.87–5.11)
RDW: 12.9 % (ref 11.5–15.5)
WBC: 4.3 10*3/uL (ref 4.0–10.5)

## 2015-04-12 LAB — CULTURE, BLOOD (ROUTINE X 2)

## 2015-04-12 LAB — HEMOGLOBIN A1C
Hgb A1c MFr Bld: 5.8 % — ABNORMAL HIGH (ref 4.8–5.6)
MEAN PLASMA GLUCOSE: 120 mg/dL

## 2015-04-12 MED ORDER — OSELTAMIVIR PHOSPHATE 30 MG PO CAPS
30.0000 mg | ORAL_CAPSULE | Freq: Two times a day (BID) | ORAL | Status: DC
  Administered 2015-04-12 – 2015-04-14 (×5): 30 mg via ORAL
  Filled 2015-04-12 (×6): qty 1

## 2015-04-12 MED ORDER — ENOXAPARIN SODIUM 30 MG/0.3ML ~~LOC~~ SOLN
30.0000 mg | SUBCUTANEOUS | Status: DC
  Administered 2015-04-12 – 2015-04-14 (×3): 30 mg via SUBCUTANEOUS
  Filled 2015-04-12 (×3): qty 0.3

## 2015-04-12 MED ORDER — SODIUM CHLORIDE 0.9 % IV SOLN
INTRAVENOUS | Status: DC
  Administered 2015-04-12: 75 mL/h via INTRAVENOUS
  Administered 2015-04-12 – 2015-04-14 (×2): via INTRAVENOUS

## 2015-04-12 NOTE — Progress Notes (Signed)
[facility] PROGRESS NOTE  Darlene Stafford [id number] DOB: [birth year] DOA: 04/10/2015 PCP: No primary care provider on file.  Assessment/Plan: 1-sepsis: Present on admission (temperature of 102.6, heart rate 113 and respiratory rate 28). Appears to be secondary to community-acquired pneumonia and influenza. -Will continue current antibiotic therapy -Continue supportive care and oxygen supplementation -As needed nebulizer  -started on Tamiflu -When necessary antipyretics and analgesics  -Patient still spiking fever, heart rate has now improved to within normal limits. Normal WBCs and normal blood pressure.   2-asthma exacerbation: Most likely secondary to pneumonia and influenza infection -Will initiate treatment with Pulmicort and as needed DuoNeb -Flutter Valve and steroids  -continue oxygen supplementation (weaning as tolerated) -Continue antibiotic and Tamiflu as mentioned above  3-fever/diarrhea: Most likely associated with influenza -Will provide supportive care and replete electrolytes as needed -PRN antipyretics -continue tamiflu -will check for C. Diff if diarrhea continues   4-1/2 positive blood culture: Gram-positive rods -Most likely contaminant -Will follow speciation and sensitivity -Continue current antibiotics -Patient currently nontoxic and exam  5-hypertension: -Will continue coreg  6-diabetes:  -will follow a1C -holding oral hypoglycemics -will use SSI while inpatient  7-obesity:  -Body mass index is 42.32 kg/(m^2). -Low calorie diet and increase exercise discussed with patient   8-hyperlipidemia: Continue Lipitor   9-AKI: pre-renal in nature -will continue IVF's -follow renal function trend -avoid/minimize nephrotoxic agents  Code Status: Full code Family Communication: Brother at bedside Disposition Plan: Remains inpatient, and glucose and data, continue treatment with antibiotics and Tamiflu; started on steroids for asthma  exacerbation.   Consultants:  None   Procedures:  See below for x-ray reports  Antibiotics:  Zithromax 04/10/15  Rocephin 04/10/15  Tamiflu 04/11/15  HPI/Subjective: Still spiking fevers overnight; with some improvement in her breathing, but still unable to speak in full sentences, requiring oxygen supplementation and with some exp wheezing.  Objective: Filed Vitals:   04/12/15 0630 04/12/15 1500  BP: 128/61 149/67  Pulse: 76 67  Temp: 97.9 F (36.6 C) 98.5 F (36.9 C)  Resp: 20 20    Intake/Output Summary (Last 24 hours) at 04/12/15 1628 Last data filed at 04/12/15 1300  Gross per 24 hour  Intake    531 ml  Output      0 ml  Net    531 ml   Filed Weights   04/10/15 1657 04/11/15 0427  Weight: 68.04 kg (150 lb) 98.3 kg (216 lb 11.4 oz)    Exam:   General:  Still spiking fever overnight; denies CP and overall feeling somewhat better. Reports that her breathing is slowly improving; still requiring oxygen supplementation. Patient still unable to speak in full sentences, but better, in comparison than yesterday.  Cardiovascular: Mild tachycardic, no rubs, no gallops, S1 and S2 appreciate on exam.  Respiratory: Diffuse expiratory wheezing, positive rhonchi, no crackles  Abdomen: Soft, nontender, nondistended, positive bowel sounds  Musculoskeletal: No edema, no cyanosis  Data Reviewed: Basic Metabolic Panel:  Recent Labs Lab 04/10/15 1726 04/11/15 0505 04/12/15 0428  NA 135 137 131*  K 3.6 3.4* 4.1  CL 101 104 100*  CO2 28 24 22   GLUCOSE 91 84 129*  BUN 15 14 18   CREATININE 1.68* 1.49* 2.11*  CALCIUM 7.7* 7.5* 7.7*   Liver Function Tests:  Recent Labs Lab 04/10/15 1726  AST 78*  ALT 72*  ALKPHOS 159*  BILITOT 1.1  PROT 6.7  ALBUMIN 3.5   CBC:  Recent Labs Lab 04/10/15 1726 04/11/15 0505 04/12/15 0428  WBC  6.8 5.0 4.3  NEUTROABS 4.0  --   --   HGB 12.6 12.1 10.6*  HCT 37.8 37.2 32.1*  MCV 91.7 92.3 91.2  PLT 223 205 204   BNP  (last 3 results)  Recent Labs  04/10/15 1726  BNP 78.8   CBG:  Recent Labs Lab 04/11/15 1745 04/11/15 1826 04/11/15 2142 04/12/15 0757 04/12/15 1238  GLUCAP 65 120* 103* 120* 158*    Recent Results (from the past 240 hour(s))  Blood Culture (routine x 2)     Status: None   Collection Time: 04/10/15  5:26 PM  Result Value Ref Range Status   Specimen Description BLOOD RIGHT ARM  Final   Special Requests BOTTLES DRAWN AEROBIC AND ANAEROBIC 5CC  Final   Culture  Setup Time   Final    GRAM POSITIVE RODS AEROBIC BOTTLE ONLY CRITICAL RESULT CALLED TO, READ BACK BY AND VERIFIED WITH: S STEWART,RN AT [id number] 04/11/15 BY L BENFIELD    Culture   Final    BACILLUS SPECIES Standardized susceptibility testing for this organism is not available. Performed at [facility]    Report Status 04/12/2015 FINAL  Final  Urine culture     Status: None (Preliminary result)   Collection Time: 04/10/15  5:55 PM  Result Value Ref Range Status   Specimen Description URINE, CATHETERIZED  Final   Special Requests NONE  Final   Culture   Final    >=100,000 COLONIES/mL ESCHERICHIA COLI Performed at [facility]    Report Status PENDING  Incomplete     Studies: Dg Chest Port 1 View  04/10/2015  CLINICAL DATA:  Wheezing and cough for past 4 days. Asthma. Diabetes. EXAM: PORTABLE CHEST 1 VIEW COMPARISON:  None. FINDINGS: Low lung volumes are seen. Coarse heterogeneous pulmonary opacity is seen throughout the right lung and to a lesser degree at the left lung base. This could be due to acute infectious or inflammatory process although chronic lung disease cannot definitely be excluded without prior exams for comparison. No evidence of pleural effusion. Heart size is within normal limits allowing for low lung volumes. IMPRESSION: Low lung volumes with coarse bilateral pulmonary opacity involving right lung greater than left. Differential diagnosis includes an acute infectious or inflammatory  process versus chronic lung disease. Consider chest CT for further evaluation if there are no prior outside radiographs available for comparison. Electronically Signed   By: Earle Gell M.D.   On: 04/10/2015 17:26    Scheduled Meds: . albuterol  2.5 mg Nebulization TID  . antiseptic oral rinse  7 mL Mouth Rinse BID  . atorvastatin  40 mg Oral q1800  . azithromycin  500 mg Intravenous Q24H  . budesonide (PULMICORT) nebulizer solution  0.25 mg Nebulization BID  . carvedilol  12.5 mg Oral BID WC  . cefTRIAXone (ROCEPHIN)  IV  1 g Intravenous Q24H  . enoxaparin (LOVENOX) injection  30 mg Subcutaneous Q24H  . escitalopram  10 mg Oral Daily  . guaiFENesin  600 mg Oral BID  . insulin aspart  0-5 Units Subcutaneous QHS  . insulin aspart  0-9 Units Subcutaneous TID WC  . methylPREDNISolone (SOLU-MEDROL) injection  40 mg Intravenous Q8H  . oseltamivir  30 mg Oral BID   Continuous Infusions: . sodium chloride 75 mL/hr (04/12/15 0825)    Active Problems:   CAP (community acquired pneumonia)   Diabetes mellitus without complication ([place])   Asthma   Acute encephalopathy   UTI (lower urinary tract infection)  AKI (acute kidney injury) ([place])   Sepsis ([place])   Hypotension    Time spent: 46 minutes    Barton Dubois  [facility] Pager [phone]. If 7PM-7AM, please contact night-coverage at [email or web address], password [facility] 04/12/2015, 4:28 PM  LOS: 2 days

## 2015-04-13 DIAGNOSIS — N390 Urinary tract infection, site not specified: Secondary | ICD-10-CM

## 2015-04-13 LAB — URINE CULTURE

## 2015-04-13 LAB — GLUCOSE, CAPILLARY
GLUCOSE-CAPILLARY: 197 mg/dL — AB (ref 65–99)
Glucose-Capillary: 185 mg/dL — ABNORMAL HIGH (ref 65–99)
Glucose-Capillary: 186 mg/dL — ABNORMAL HIGH (ref 65–99)
Glucose-Capillary: 262 mg/dL — ABNORMAL HIGH (ref 65–99)

## 2015-04-13 MED ORDER — LOPERAMIDE HCL 2 MG PO CAPS: 2.0000 mg | ORAL_CAPSULE | ORAL | Status: DC | PRN

## 2015-04-13 MED ORDER — AMOXICILLIN-POT CLAVULANATE 500-125 MG PO TABS
1.0000 | ORAL_TABLET | Freq: Two times a day (BID) | ORAL | Status: DC
  Administered 2015-04-13 – 2015-04-14 (×2): 500 mg via ORAL
  Filled 2015-04-13 (×3): qty 1

## 2015-04-13 MED ORDER — PREDNISONE 20 MG PO TABS
60.0000 mg | ORAL_TABLET | Freq: Every day | ORAL | Status: DC
  Administered 2015-04-14: 60 mg via ORAL
  Filled 2015-04-13: qty 3

## 2015-04-13 NOTE — Care Management Important Message (Signed)
Important Message  Patient Details IM Letter given to Cookie/Case Manager to present to Patient Name: Yuka Siravo MRN: [id number] Date of Birth: [birth year]   Medicare Important Message Given:  Yes    Camillo Flaming 04/13/2015, 2:14 [place] Message  Patient Details  Name: Jodilyn Byard MRN: [id number] Date of Birth: 1950/02/23   Medicare Important Message Given:  Yes    Camillo Flaming 04/13/2015, 2:13 PM

## 2015-04-13 NOTE — Progress Notes (Signed)
[facility] PROGRESS NOTE  Darlene Stafford [id number] DOB: [birth year] DOA: 04/10/2015 PCP: No primary care provider on file.  Assessment/Plan: 1-Sepsis: Present on admission (temperature of 102.6, heart rate 113 and respiratory rate 28). Appears to be secondary to community-acquired pneumonia and influenza. -Will continue transition to augmentin  -Continue supportive care and oxygen supplementation -As needed nebulizer  -continue Tamiflu -When necessary antipyretics and analgesics  -Patient now w/o fever for almost 24 hours; significantly improved overall; normal HR, RR and WBC's  2-asthma exacerbation: Most likely secondary to pneumonia and influenza infection -Will continue treatment with Pulmicort and as needed DuoNeb -Flutter Valve and steroids; last one transition to PO  -continue oxygen supplementation (weaning as tolerated)  3-fever/diarrhea: Most likely associated with influenza -Will provide supportive care and replete electrolytes as needed -PRN antipyretics -continue tamiflu -C. Diff neg -PRN loperamide  4-1/2 positive blood culture: Gram-positive rods (bacilus species) -Most likely contaminant -Patient currently nontoxic and exam  5-hypertension: -Will continue coreg  6-diabetes:  -A1C 5.8 -continue holding oral hypoglycemics -will continue use SSI while inpatient  7-obesity:  -Body mass index is 42.32 kg/(m^2). -Low calorie diet and increase exercise discussed with patient   8-hyperlipidemia: Continue Lipitor   9-AKI: pre-renal in nature; but unknown baseline. -will continue IVF's; rate adjusted and patient encouraged to maintain good hydration by mouth. -follow renal function trend -avoid/minimize nephrotoxic agents  10-E. Coli UTI -pan sensitive  -has been on ceftriaxone for 3 days -will switch to Augmentin   Code Status: Full code Family Communication: Brother at bedside Disposition Plan: Remains inpatient, will transition antibiotics and  steroids to oral regimen; will follow patient's tolerance and clinical response. Most likely home in a.m.  Consultants:  None   Procedures:  See below for x-ray reports  Antibiotics:  Zithromax 04/10/15>>>04/13/15  Rocephin 04/10/15>>>04/13/15  Tamiflu 04/11/15  Augmentin 3/10  HPI/Subjective: Afebrile, feeling much better and with significant improvement in her breathing. Patient denies chest pain and is no longer wheezing.  Objective: Filed Vitals:   04/12/15 2232 04/13/15 1451  BP: 147/71 174/84  Pulse: 76 76  Temp: 98.4 F (36.9 C) 97.9 F (36.6 C)  Resp: 20 20    Intake/Output Summary (Last 24 hours) at 04/13/15 1734 Last data filed at 04/13/15 1456  Gross per 24 hour  Intake 2338.75 ml  Output      0 ml  Net 2338.75 ml   Filed Weights   04/10/15 1657 04/11/15 0427  Weight: 68.04 kg (150 lb) 98.3 kg (216 lb 11.4 oz)    Exam:   General:  Afebrile now for almost 24 hours; breathing much better and with good O2 sat on RA. Denies CP and overall feeling much better. Speaking in full sentences and w/o wheezing on exam  Cardiovascular: Mild tachycardic, no rubs, no gallops, S1 and S2 appreciate on exam.  Respiratory: Scattered rhonchi, no frank wheezing, improved air movement   Abdomen: Soft, nontender, nondistended, positive bowel sounds  Musculoskeletal: No edema, no cyanosis  Data Reviewed: Basic Metabolic Panel:  Recent Labs Lab 04/10/15 1726 04/11/15 0505 04/12/15 0428  NA 135 137 131*  K 3.6 3.4* 4.1  CL 101 104 100*  CO2 28 24 22   GLUCOSE 91 84 129*  BUN 15 14 18   CREATININE 1.68* 1.49* 2.11*  CALCIUM 7.7* 7.5* 7.7*   Liver Function Tests:  Recent Labs Lab 04/10/15 1726  AST 78*  ALT 72*  ALKPHOS 159*  BILITOT 1.1  PROT 6.7  ALBUMIN 3.5   CBC:  Recent  Labs Lab 04/10/15 1726 04/11/15 0505 04/12/15 0428  WBC 6.8 5.0 4.3  NEUTROABS 4.0  --   --   HGB 12.6 12.1 10.6*  HCT 37.8 37.2 32.1*  MCV 91.7 92.3 91.2  PLT 223 205 204    BNP (last 3 results)  Recent Labs  04/10/15 1726  BNP 78.8   CBG:  Recent Labs Lab 04/12/15 1656 04/12/15 2124 04/13/15 0752 04/13/15 1220 04/13/15 1708  GLUCAP 164* 173* 185* 186* 262*    Recent Results (from the past 240 hour(s))  Blood Culture (routine x 2)     Status: None   Collection Time: 04/10/15  5:26 PM  Result Value Ref Range Status   Specimen Description BLOOD RIGHT ARM  Final   Special Requests BOTTLES DRAWN AEROBIC AND ANAEROBIC 5CC  Final   Culture  Setup Time   Final    GRAM POSITIVE RODS AEROBIC BOTTLE ONLY CRITICAL RESULT CALLED TO, READ BACK BY AND VERIFIED WITH: S STEWART,RN AT [id number] 04/11/15 BY L BENFIELD    Culture   Final    BACILLUS SPECIES Standardized susceptibility testing for this organism is not available. Performed at [facility]    Report Status 04/12/2015 FINAL  Final  Urine culture     Status: None   Collection Time: 04/10/15  5:55 PM  Result Value Ref Range Status   Specimen Description URINE, CATHETERIZED  Final   Special Requests NONE  Final   Culture   Final    >=100,000 COLONIES/mL ESCHERICHIA COLI Performed at [facility]    Report Status 04/13/2015 FINAL  Final   Organism ID, Bacteria ESCHERICHIA COLI  Final      Susceptibility   Escherichia coli - MIC*    AMPICILLIN 4 SENSITIVE Sensitive     CEFAZOLIN <=4 SENSITIVE Sensitive     CEFTRIAXONE <=1 SENSITIVE Sensitive     CIPROFLOXACIN <=0.25 SENSITIVE Sensitive     GENTAMICIN <=1 SENSITIVE Sensitive     IMIPENEM <=0.25 SENSITIVE Sensitive     NITROFURANTOIN <=16 SENSITIVE Sensitive     TRIMETH/SULFA <=20 SENSITIVE Sensitive     AMPICILLIN/SULBACTAM <=2 SENSITIVE Sensitive     PIP/TAZO <=4 SENSITIVE Sensitive     * >=100,000 COLONIES/mL ESCHERICHIA COLI  C difficile quick scan w PCR reflex     Status: None   Collection Time: 04/12/15  9:32 PM  Result Value Ref Range Status   C Diff antigen NEGATIVE NEGATIVE Final   C Diff toxin NEGATIVE NEGATIVE  Final   C Diff interpretation Negative for toxigenic C. difficile  Final     Studies: No results found.  Scheduled Meds: . albuterol  2.5 mg Nebulization TID  . amoxicillin-clavulanate  1 tablet Oral BID  . antiseptic oral rinse  7 mL Mouth Rinse BID  . atorvastatin  40 mg Oral q1800  . budesonide (PULMICORT) nebulizer solution  0.25 mg Nebulization BID  . carvedilol  12.5 mg Oral BID WC  . enoxaparin (LOVENOX) injection  30 mg Subcutaneous Q24H  . escitalopram  10 mg Oral Daily  . guaiFENesin  600 mg Oral BID  . insulin aspart  0-5 Units Subcutaneous QHS  . insulin aspart  0-9 Units Subcutaneous TID WC  . oseltamivir  30 mg Oral BID  . [START ON 04/14/2015] predniSONE  60 mg Oral Q breakfast   Continuous Infusions: . sodium chloride 75 mL/hr at 04/12/15 2052    Active Problems:   CAP (community acquired pneumonia)   Diabetes mellitus without complication (  [place])   Asthma   Acute encephalopathy   UTI (lower urinary tract infection)   AKI (acute kidney injury) ([place])   Sepsis ([place])   Hypotension    Time spent: 35 minutes    Barton Dubois  [facility] Pager [phone]. If 7PM-7AM, please contact night-coverage at [email or web address], password [facility] 04/13/2015, 5:34 PM  LOS: 3 days

## 2015-04-14 LAB — BASIC METABOLIC PANEL
ANION GAP: 9 (ref 5–15)
BUN: 15 mg/dL (ref 6–20)
CO2: 25 mmol/L (ref 22–32)
Calcium: 8.2 mg/dL — ABNORMAL LOW (ref 8.9–10.3)
Chloride: 104 mmol/L (ref 101–111)
Creatinine, Ser: 1.17 mg/dL — ABNORMAL HIGH (ref 0.44–1.00)
GFR calc non Af Amer: 48 mL/min — ABNORMAL LOW (ref >60)
GFR, EST AFRICAN AMERICAN: 56 mL/min — AB (ref >60)
GLUCOSE: 172 mg/dL — AB (ref 65–99)
POTASSIUM: 3.5 mmol/L (ref 3.5–5.1)
Sodium: 138 mmol/L (ref 135–145)

## 2015-04-14 LAB — GLUCOSE, CAPILLARY
GLUCOSE-CAPILLARY: 157 mg/dL — AB (ref 65–99)
GLUCOSE-CAPILLARY: 156 mg/dL — AB (ref 65–99)

## 2015-04-14 LAB — STREP PNEUMONIAE URINARY ANTIGEN: Strep Pneumo Urinary Antigen: NEGATIVE

## 2015-04-14 MED ORDER — ENOXAPARIN SODIUM 40 MG/0.4ML ~~LOC~~ SOLN: 40.0000 mg | SUBCUTANEOUS | Status: DC

## 2015-04-14 MED ORDER — PREDNISONE 20 MG PO TABS: ORAL_TABLET | ORAL | Status: DC

## 2015-04-14 MED ORDER — OSELTAMIVIR PHOSPHATE 30 MG PO CAPS: 30.0000 mg | ORAL_CAPSULE | Freq: Two times a day (BID) | ORAL | Status: DC

## 2015-04-14 MED ORDER — AMOXICILLIN-POT CLAVULANATE 500-125 MG PO TABS: 1.0000 | ORAL_TABLET | Freq: Two times a day (BID) | ORAL | Status: DC

## 2015-04-14 MED ORDER — OSELTAMIVIR PHOSPHATE 75 MG PO CAPS
75.0000 mg | ORAL_CAPSULE | Freq: Two times a day (BID) | ORAL | Status: DC
  Filled 2015-04-14: qty 1

## 2015-04-14 MED ORDER — OSELTAMIVIR PHOSPHATE 75 MG PO CAPS: 75.0000 mg | ORAL_CAPSULE | Freq: Two times a day (BID) | ORAL | Status: DC

## 2015-04-14 MED ORDER — GUAIFENESIN ER 600 MG PO TB12: 600.0000 mg | ORAL_TABLET | Freq: Two times a day (BID) | ORAL | Status: AC

## 2015-04-14 MED ORDER — PREDNISONE 5 MG PO TABS: 5.0000 mg | ORAL_TABLET | Freq: Every day | ORAL | Status: DC

## 2015-04-14 NOTE — Discharge Summary (Signed)
Physician Discharge Summary  Darlene Stafford [id number] DOB: February 23, 1950 DOA: 04/10/2015  PCP: No primary care provider on file.  Admit date: 04/10/2015 Discharge date: 04/14/2015  Time spent: 40 minutes  Recommendations for Outpatient Follow-up:  1. Repeat BMET to follow electrolytes and renal function  2. Reassess BP and adjust antihypertensive regimen as needed  3. Repeat CXR in 3-4 weeks to follow/assure resolution of infiltrates    Discharge Diagnoses:  Active Problems:   CAP (community acquired pneumonia)   Diabetes mellitus without complication (Darlene Stafford )   Asthma   Acute encephalopathy   E. Coli UTI (lower urinary tract infection)   AKI (acute kidney injury) (Darlene Stafford)   Sepsis (Darlene Stafford)   Obesity    Discharge Condition: stable and improved. Discharge home with instructions to follow up with PCP in 10 days.  Diet recommendation: low sodium, modified carbohydrates and low calorie diet   Filed Weights   04/10/15 1657 04/11/15 0427  Weight: 68.04 kg (150 lb) 98.3 kg (216 lb 11.4 oz)    History of present illness:  As per H&P written by Dr. Arnoldo Stafford on 04/10/15 65 y.o. female with a history of Asthma, DM2 who presents to the ED with complaints of 4 days of SOB, Cough, Fevers, Chills, and Myalgias, and Chest Congestion and Wheezing. She has also had Nausea Vomitng and Diarrhea She had a fever to 102.6 in the ED, She was found to have a multifocal pneumonia on Chest X-ray. She was placed on IV Rocephin and Azithromycin for CAP pneumonia and Flu studies were sent.   Hospital Course:  1-Sepsis: Present on admission (temperature of 102.6, heart rate 113 and respiratory rate 28). Appears to be secondary to community-acquired pneumonia and influenza. -Will discharge home on Augmentin to complete antibiotic therapy  -advise to maintain adequate hydration  -continue PRN nebulizer  -continue Tamiflu and antibiotics as instructed  -When necessary antipyretics and analgesics (OTC) -Patient  now w/o fever for almost 36 hours; significantly improved overall; normal HR, RR and WBC's  2-asthma exacerbation: Most likely secondary to pneumonia and influenza infection -Will continue treatment with nebulizer, antibiotics, tamiflu and steroids tapering  -continue flutter valve  -no oxygen needed at discharge   3-fever/diarrhea: Most likely associated with influenza -supportive care provided and electrolytes repleted as needed  -ok to continue PRN antipyretics -continue tamiflu -C. Diff neg -PRN loperamide instructed   4-1/2 positive blood culture: Gram-positive rods (bacilus species) -Most likely contaminant -Patient currently nontoxic and exam  5-hypertension: -Will continue home antihypertensive regimen -advise to follow heart healthy diet   6-diabetes:  -A1C 5.8 -will resume oral hypoglycemic regimen   7-obesity:  -Body mass index is 42.32 kg/(m^2). -Low calorie diet and increase exercise discussed with patient   8-hyperlipidemia: Continue Lipitor   9-AKI: pre-renal in nature; but unknown baseline. -after IVF's Cr down to 1.17 and GFR of 56 -patient encourage to keep herself well hydrated  -follow renal function trend as an outpatient  -avoid/minimize nephrotoxic agents  10-E. Coli UTI -pan sensitive  -has been on ceftriaxone for 3 days -will switch to Augmentin by mouth and complete antibiotic therapy   Procedures:  See below for x-ray reports   Consultations:  None   Discharge Exam: Filed Vitals:   04/14/15 0237 04/14/15 0524  BP: 181/75 162/73  Pulse: 75 71  Temp: 98.4 F (36.9 C) 98 F (36.7 C)  Resp: 18 18    General: Afebrile now for almost 24 hours; breathing much better and with good O2 sat on RA. Denies  CP and overall feeling much better. Speaking in full sentences and w/o wheezing on exam  Cardiovascular: RRR, no rubs, no gallops, S1 and S2 appreciate on exam.  Respiratory: Scattered rhonchi, no frank wheezing on exam, improved  air movement   Abdomen: Soft, nontender, nondistended, positive bowel sounds  Musculoskeletal: No edema, no cyanosis   Discharge Instructions   Discharge Instructions    Diet - low sodium heart healthy    Complete by:  As directed      Discharge instructions    Complete by:  As directed   Keep yourself well hydrated Take medications as prescribed Please arrange follow up with PCP in 10 days  Follow heart healthy and modified carb diet          Current Discharge Medication List    START taking these medications   Details  amoxicillin-clavulanate (AUGMENTIN) 500-125 MG tablet Take 1 tablet (500 mg total) by mouth 2 (two) times daily. Qty: 10 tablet, Refills: 0    guaiFENesin (MUCINEX) 600 MG 12 hr tablet Take 1 tablet (600 mg total) by mouth 2 (two) times daily. Qty: 40 tablet, Refills: 0    oseltamivir (TAMIFLU) 30 MG capsule Take 1 capsule (30 mg total) by mouth 2 (two) times daily. Qty: 6 capsule, Refills: 0      CONTINUE these medications which have CHANGED   Details  !! predniSONE (DELTASONE) 20 MG tablet Take 3 tablets by mouth daily X 1 day; then 2 tablets by mouth daily X 2 days, then 1 tablet by mouth daily X 3 days; then resume home daily dose of prednisone Qty: 10 tablet, Refills: 0    !! predniSONE (DELTASONE) 5 MG tablet Take 1 tablet (5 mg total) by mouth daily with breakfast. To be taken after acute tapering completed     !! - Potential duplicate medications found. Please discuss with provider.    CONTINUE these medications which have NOT CHANGED   Details  albuterol (PROVENTIL HFA;VENTOLIN HFA) 108 (90 Base) MCG/ACT inhaler Inhale 2 puffs into the lungs 2 (two) times daily as needed for wheezing or shortness of breath.    atorvastatin (LIPITOR) 40 MG tablet Take 40 mg by mouth daily.    carvedilol (COREG) 12.5 MG tablet Take 12.5 mg by mouth 2 (two) times daily with a meal.    escitalopram (LEXAPRO) 10 MG tablet Take 10 mg by mouth daily.     LORazepam (ATIVAN) 0.5 MG tablet Take 0.5 mg by mouth 2 (two) times daily as needed for anxiety.    sitaGLIPtin (JANUVIA) 100 MG tablet Take 100 mg by mouth daily.    traMADol (ULTRAM) 50 MG tablet Take 50-100 mg by mouth every 6 (six) hours as needed (pain.).       No Known Allergies   The results of significant diagnostics from this hospitalization (including imaging, microbiology, ancillary and laboratory) are listed below for reference.    Significant Diagnostic Studies: Dg Chest Port 1 View  04/10/2015  CLINICAL DATA:  Wheezing and cough for past 4 days. Asthma. Diabetes. EXAM: PORTABLE CHEST 1 VIEW COMPARISON:  None. FINDINGS: Low lung volumes are seen. Coarse heterogeneous pulmonary opacity is seen throughout the right lung and to a lesser degree at the left lung base. This could be due to acute infectious or inflammatory process although chronic lung disease cannot definitely be excluded without prior exams for comparison. No evidence of pleural effusion. Heart size is within normal limits allowing for low lung volumes. IMPRESSION: Low lung volumes  with coarse bilateral pulmonary opacity involving right lung greater than left. Differential diagnosis includes an acute infectious or inflammatory process versus chronic lung disease. Consider chest CT for further evaluation if there are no prior outside radiographs available for comparison. Electronically Signed   By: Earle Gell M.D.   On: 04/10/2015 17:26    Microbiology: Recent Results (from the past 240 hour(s))  Blood Culture (routine x 2)     Status: None   Collection Time: 04/10/15  5:26 PM  Result Value Ref Range Status   Specimen Description BLOOD RIGHT ARM  Final   Special Requests BOTTLES DRAWN AEROBIC AND ANAEROBIC 5CC  Final   Culture  Setup Time   Final    GRAM POSITIVE RODS AEROBIC BOTTLE ONLY CRITICAL RESULT CALLED TO, READ BACK BY AND VERIFIED WITH: S STEWART,RN AT [id number] 04/11/15 BY L BENFIELD    Culture   Final     BACILLUS SPECIES Standardized susceptibility testing for this organism is not available. Performed at [facility]    Report Status 04/12/2015 FINAL  Final  Urine culture     Status: None   Collection Time: 04/10/15  5:55 PM  Result Value Ref Range Status   Specimen Description URINE, CATHETERIZED  Final   Special Requests NONE  Final   Culture   Final    >=100,000 COLONIES/mL ESCHERICHIA COLI Performed at [facility]    Report Status 04/13/2015 FINAL  Final   Organism ID, Bacteria ESCHERICHIA COLI  Final      Susceptibility   Escherichia coli - MIC*    AMPICILLIN 4 SENSITIVE Sensitive     CEFAZOLIN <=4 SENSITIVE Sensitive     CEFTRIAXONE <=1 SENSITIVE Sensitive     CIPROFLOXACIN <=0.25 SENSITIVE Sensitive     GENTAMICIN <=1 SENSITIVE Sensitive     IMIPENEM <=0.25 SENSITIVE Sensitive     NITROFURANTOIN <=16 SENSITIVE Sensitive     TRIMETH/SULFA <=20 SENSITIVE Sensitive     AMPICILLIN/SULBACTAM <=2 SENSITIVE Sensitive     PIP/TAZO <=4 SENSITIVE Sensitive     * >=100,000 COLONIES/mL ESCHERICHIA COLI  C difficile quick scan w PCR reflex     Status: None   Collection Time: 04/12/15  9:32 PM  Result Value Ref Range Status   C Diff antigen NEGATIVE NEGATIVE Final   C Diff toxin NEGATIVE NEGATIVE Final   C Diff interpretation Negative for toxigenic C. difficile  Final     Labs: Basic Metabolic Panel:  Recent Labs Lab 04/10/15 1726 04/11/15 0505 04/12/15 0428 04/14/15 0550  NA 135 137 131* 138  K 3.6 3.4* 4.1 3.5  CL 101 104 100* 104  CO2 28 24 22 25   GLUCOSE 91 84 129* 172*  BUN 15 14 18 15   CREATININE 1.68* 1.49* 2.11* 1.17*  CALCIUM 7.7* 7.5* 7.7* 8.2*   Liver Function Tests:  Recent Labs Lab 04/10/15 1726  AST 78*  ALT 72*  ALKPHOS 159*  BILITOT 1.1  PROT 6.7  ALBUMIN 3.5   CBC:  Recent Labs Lab 04/10/15 1726 04/11/15 0505 04/12/15 0428  WBC 6.8 5.0 4.3  NEUTROABS 4.0  --   --   HGB 12.6 12.1 10.6*  HCT 37.8 37.2 32.1*  MCV 91.7  92.3 91.2  PLT 223 205 204   BNP (last 3 results)  Recent Labs  04/10/15 1726  BNP 78.8   CBG:  Recent Labs Lab 04/13/15 0752 04/13/15 1220 04/13/15 1708 04/13/15 2207 04/14/15 0733  GLUCAP 185* 186* 262* 197* 157*  Signed:  Barton Dubois MD.  [facility] 04/14/2015, 12:23 PM

## 2015-09-08 ENCOUNTER — Other Ambulatory Visit: Payer: Self-pay | Admitting: Family Medicine

## 2015-09-08 DIAGNOSIS — Z1231 Encounter for screening mammogram for malignant neoplasm of breast: Secondary | ICD-10-CM

## 2015-09-28 ENCOUNTER — Ambulatory Visit: Payer: Medicare (Managed Care)

## 2015-11-30 ENCOUNTER — Other Ambulatory Visit: Payer: Self-pay | Admitting: Family Medicine

## 2015-12-04 ENCOUNTER — Other Ambulatory Visit: Payer: Self-pay | Admitting: Family Medicine

## 2015-12-04 DIAGNOSIS — E2839 Other primary ovarian failure: Secondary | ICD-10-CM

## 2016-01-01 ENCOUNTER — Institutional Professional Consult (permissible substitution): Payer: Medicare (Managed Care) | Admitting: Internal Medicine

## 2016-04-14 ENCOUNTER — Institutional Professional Consult (permissible substitution): Payer: Medicare (Managed Care) | Admitting: Emergency Medicine

## 2016-08-22 ENCOUNTER — Ambulatory Visit (INDEPENDENT_AMBULATORY_CARE_PROVIDER_SITE_OTHER): Payer: Medicare Other | Admitting: Pulmonary Disease

## 2016-08-22 ENCOUNTER — Encounter: Payer: Self-pay | Admitting: Pulmonary Disease

## 2016-08-22 VITALS — BP 128/80 | HR 94 | Ht 61.0 in | Wt 216.4 lb

## 2016-08-22 DIAGNOSIS — J45909 Unspecified asthma, uncomplicated: Secondary | ICD-10-CM

## 2016-08-22 DIAGNOSIS — R0609 Other forms of dyspnea: Secondary | ICD-10-CM | POA: Diagnosis not present

## 2016-08-22 MED ORDER — FLUTICASONE PROPIONATE HFA 44 MCG/ACT IN AERO: 2.0000 | INHALATION_SPRAY | Freq: Two times a day (BID) | RESPIRATORY_TRACT | Status: DC

## 2016-08-22 MED ORDER — FLUTICASONE PROPIONATE 50 MCG/ACT NA SUSP: 1.0000 | Freq: Every day | NASAL | 2 refills | Status: DC

## 2016-08-22 MED ORDER — MONTELUKAST SODIUM 10 MG PO TABS: 10.0000 mg | ORAL_TABLET | Freq: Every day | ORAL | 5 refills | Status: AC

## 2016-08-22 NOTE — Progress Notes (Signed)
   Subjective:    Patient ID: Darlene Stafford, female    DOB: Nov 18, 1950, 66 y.o.   MRN: [id number]  HPI    Review of Systems  Constitutional: Negative for fever and unexpected weight change.  HENT: Positive for congestion and sneezing. Negative for dental problem, ear pain, nosebleeds, postnasal drip, rhinorrhea, sinus pressure, sore throat and trouble swallowing.   Eyes: Negative for redness and itching.  Respiratory: Positive for cough, shortness of breath and wheezing. Negative for chest tightness.   Cardiovascular: Negative for palpitations and leg swelling.  Gastrointestinal: Negative for nausea and vomiting.  Genitourinary: Negative for dysuria.  Musculoskeletal: Negative for joint swelling.  Skin: Negative for rash.  Neurological: Positive for headaches.  Hematological: Does not bruise/bleed easily.  Psychiatric/Behavioral: Positive for dysphoric mood. The patient is nervous/anxious.        Objective:   Physical Exam        Assessment & Plan:

## 2016-08-22 NOTE — Patient Instructions (Signed)
Flovent 2 puffs twice per day, and rinse mouth after each use Montelukast (singulair) 10 mg nightly Flonase 1 spray each nostril daily Nasal saline spray daily Chest xray and lab tests today Will schedule pulmonary function test  Follow up in 6 to 8 weeks with Dr. Halford Chessman or Nurse Practitioner

## 2016-08-22 NOTE — Progress Notes (Signed)
Past surgical history She  has a past surgical history that includes Replacement total knee (2015) and Partial hysterectomy (1998).  Family history Her family history includes Breast cancer in her sister; Sarcoidosis in her sister, sister, and sister.  Social history She  reports that she has never smoked. She has never used smokeless tobacco. She reports that she does not drink alcohol or use drugs.  Allergies  Allergen Reactions  . Fish Allergy Anaphylaxis  . Shellfish Allergy Anaphylaxis  . Sulfa Antibiotics Hives and Itching    Current Outpatient Prescriptions on File Prior to Visit  Medication Sig  . atorvastatin (LIPITOR) 40 MG tablet Take 40 mg by mouth daily.  . carvedilol (COREG) 12.5 MG tablet Take 12.5 mg by mouth 2 (two) times daily with a meal.  . guaiFENesin (MUCINEX) 600 MG 12 hr tablet Take 1 tablet (600 mg total) by mouth 2 (two) times daily.  . predniSONE (DELTASONE) 5 MG tablet Take 1 tablet (5 mg total) by mouth daily with breakfast. To be taken after acute tapering completed   No current facility-administered medications on file prior to visit.     Chief Complaint  Patient presents with  . PULMONARY CONSULT    Referred by Dr Doreene Nest for Asthma. Uses Albuterol rescue and nebulizer. Wants to discuss change in maint inhaler -- currently has Rx for Advair but not able to afford it.     Past medical history She  has a past medical history of Asthma; CVA (cerebral vascular accident) ([place]); Diabetes mellitus without complication ([place]); and Sarcoidosis of other sites.  Vital signs BP 128/80 (BP Location: Right Arm, Cuff Size: Normal)   Pulse 94   Ht 5\' 1"  (1.549 m)   Wt 216 lb 6.4 oz (98.2 kg)   SpO2 98%   BMI 40.89 kg/m   History of present illness Darlene Stafford is a 66 y.o. female with asthma.  She was diagnosed with asthma as a child.  She has allergies to grass.  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  She gets hives from nuts, shellfish, and sulfa.   She denies other skin rash or nasal polyps.  She takes aspirin daily.    Her husband died and then she had drop in her income.  She hasn't been able to work since she had a stroke.  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  She is in the process of finding a new ophthalmologist.  She was hospitalized in March 2017 with Influenza pneumonia.  She also reports pneumonia in 2016.  No history of TB.  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  She was never on allergy shots.  She uses to take singulair and this helped.  She can't afford advair.  She only has albuterol.  She uses flonase and nasal irrigation intermittently.  She is limited in her activity due to getting winded.  She gets intermittent cough with clear sputum and occasional wheeze.    Physical exam  General - No distress Eyes - pupils reactive, wears glasses ENT - No sinus tenderness, no oral exudate, no LAN, no thyromegaly, TM clear, pupils equal/reactive, Rt facial droop Cardiac - s1s2 regular, no murmur, pulses symmetric Chest - No wheeze/rales/dullness, good air entry, normal respiratory excursion Back - No focal tenderness Abd - Soft, non-tender, no organomegaly, + bowel sounds Ext - No edema Neuro - Normal strength, cranial nerves intact Skin - No rashes Psych - Normal mood, and behavior  CMP Latest Ref Rng & Units 04/14/2015 04/12/2015 04/11/2015  Glucose 65 - 99 mg/dL 172(H) 129(H) 84  BUN 6 - 20 mg/dL 15 18 14   Creatinine 0.44 - 1.00 mg/dL 1.17(H) 2.11(H) 1.49(H)  Sodium 135 - 145 mmol/L 138 131(L) 137  Potassium 3.5 - 5.1 mmol/L 3.5 4.1 3.4(L)  Chloride 101 - 111 mmol/L 104 100(L) 104  CO2 22 - 32 mmol/L 25 22 24   Calcium 8.9 - 10.3 mg/dL 8.2(L) 7.7(L) 7.5(L)  Total Protein 6.5 - 8.1 g/dL - - -  Total Bilirubin 0.3 - 1.2 mg/dL - - -  Alkaline Phos 38 - 126 U/L - - -  AST 15 - 41 U/L - - -  ALT 14 - 54 U/L - - -     CBC Latest Ref Rng & Units 04/12/2015 04/11/2015 04/10/2015  WBC 4.0  - 10.5 K/uL 4.3 5.0 6.8  Hemoglobin 12.0 - 15.0 g/dL 10.6(L) 12.1 12.6  Hematocrit 36.0 - 46.0 % 32.1(L) 37.2 37.8  Platelets 150 - 400 K/uL 204 205 223     Discussion 66 yo female with dyspnea on exertion and hx of allergic asthma.  She has trouble affording her medications.  She also has hx of ocular sarcoidosis.   Assessment/plan  Dyspnea on exertion with hx of allergic asthma. - change to flovent and restart singulair - continue prn albuterol - check CXR, CBC with diff, CMET, RAST with IgE - nasal irrigation, flonase  Ocular sarcoidosis. - check CXR and PFT to determine if there might be pulmonary involvement as well - she is on chronic prednisone    Patient Instructions  Flovent 2 puffs twice per day, and rinse mouth after each use Montelukast (singulair) 10 mg nightly Flonase 1 spray each nostril daily Nasal saline spray daily Chest xray and lab tests today Will schedule pulmonary function test  Follow up in 6 to 8 weeks with Dr. Halford Chessman or Nurse Practitioner    Chesley Mires, MD [place] [facility]/Sleep Pager:  [phone] 08/22/2016, 9:55 AM

## 2016-08-25 ENCOUNTER — Telehealth: Payer: Self-pay | Admitting: Pulmonary Disease

## 2016-08-25 MED ORDER — FLUTICASONE PROPIONATE HFA 44 MCG/ACT IN AERO: 2.0000 | INHALATION_SPRAY | Freq: Two times a day (BID) | RESPIRATORY_TRACT | 2 refills | Status: DC

## 2016-08-25 MED ORDER — FLUTICASONE PROPIONATE 50 MCG/ACT NA SUSP: 1.0000 | Freq: Every day | NASAL | 2 refills | Status: DC

## 2016-08-25 NOTE — Telephone Encounter (Signed)
Spoke with patient, states that she went to the pharmacy and her prescriptions were not there on Friday night.  Pt needs Flovent and Flonase resent to [facility].  Records show that these were sent incorrectly initially. Apologized for the inconvenience.  Medications resent. Nothing further needed.

## 2016-08-28 ENCOUNTER — Ambulatory Visit (INDEPENDENT_AMBULATORY_CARE_PROVIDER_SITE_OTHER)
Admission: RE | Admit: 2016-08-28 | Discharge: 2016-08-28 | Disposition: A | Discharge status: Home / Self Care | Payer: Medicare Other | Source: Ambulatory Visit | Attending: Pulmonary Disease | Admitting: Pulmonary Disease

## 2016-08-28 DIAGNOSIS — R0609 Other forms of dyspnea: Secondary | ICD-10-CM

## 2016-09-01 ENCOUNTER — Telehealth: Payer: Self-pay | Admitting: Pulmonary Disease

## 2016-09-01 DIAGNOSIS — D869 Sarcoidosis, unspecified: Secondary | ICD-10-CM

## 2016-09-01 NOTE — Telephone Encounter (Signed)
Dg Chest 2 View  Result Date: 08/28/2016 CLINICAL DATA:  Shortness of breath. EXAM: CHEST  2 VIEW COMPARISON:  04/10/2015 . FINDINGS: Heart size stable. Diffuse bilateral from interstitial prominence with nodularity, particularly prominent on the right again noted without significant interim change. Findings are most consistent with chronic interstitial lung disease . Stable right sided pleuroparenchymal thickening consistent with scarring . Heart size stable. Thoracic spine scoliosis . IMPRESSION: Chronic interstitial lung disease and pleuroparenchymal scarring. Stable changes from prior exam. Electronically Signed   By: Marcello Moores  Register   On: 08/28/2016 11:12     Please let her know her chest xray shows changes concerning for sarcoidosis involvement in her lungs.  If she is agreeable, then she needs to have high resolution CT chest to further evaluate.  She also needs to schedule a time to come back in for her lab tests ordered from 08/22/16.

## 2016-09-01 NOTE — Telephone Encounter (Signed)
Pt calling to get results of xray, please advise//sad

## 2016-09-01 NOTE — Telephone Encounter (Signed)
Spoke with pt. She is aware of her results. Pt is agreeable to the CT. This has been ordered. She didn't know that VS wanted blood work done but she will come this week and have this done. Nothing further was needed.

## 2016-09-04 ENCOUNTER — Inpatient Hospital Stay: Admission: RE | Admit: 2016-09-04 | Payer: Medicare Other | Source: Ambulatory Visit

## 2016-09-08 ENCOUNTER — Inpatient Hospital Stay: Admission: RE | Admit: 2016-09-08 | Payer: Medicare Other | Source: Ambulatory Visit

## 2016-09-30 ENCOUNTER — Other Ambulatory Visit (INDEPENDENT_AMBULATORY_CARE_PROVIDER_SITE_OTHER): Payer: Medicare Other

## 2016-09-30 DIAGNOSIS — R0609 Other forms of dyspnea: Secondary | ICD-10-CM

## 2016-09-30 LAB — COMPREHENSIVE METABOLIC PANEL
ALT: 19 U/L (ref 0–35)
AST: 19 U/L (ref 0–37)
Albumin: 4.1 g/dL (ref 3.5–5.2)
Alkaline Phosphatase: 69 U/L (ref 39–117)
BUN: 24 mg/dL — ABNORMAL HIGH (ref 6–23)
CO2: 28 meq/L (ref 19–32)
Calcium: 9.2 mg/dL (ref 8.4–10.5)
Chloride: 102 mEq/L (ref 96–112)
Creatinine, Ser: 1.37 mg/dL — ABNORMAL HIGH (ref 0.40–1.20)
GFR: 49.62 mL/min — AB (ref >60.00)
GLUCOSE: 160 mg/dL — AB (ref 70–99)
Potassium: 3.5 mEq/L (ref 3.5–5.1)
SODIUM: 137 meq/L (ref 135–145)
Total Bilirubin: 0.5 mg/dL (ref 0.2–1.2)
Total Protein: 7.4 g/dL (ref 6.0–8.3)

## 2016-09-30 LAB — CBC WITH DIFFERENTIAL/PLATELET
BASOS ABS: 0.1 10*3/uL (ref 0.0–0.1)
Basophils Relative: 1 % (ref 0.0–3.0)
EOS ABS: 0.2 10*3/uL (ref 0.0–0.7)
Eosinophils Relative: 2.3 % (ref 0.0–5.0)
HCT: 39.1 % (ref 36.0–46.0)
Hemoglobin: 13 g/dL (ref 12.0–15.0)
LYMPHS ABS: 1.1 10*3/uL (ref 0.7–4.0)
Lymphocytes Relative: 14 % (ref 12.0–46.0)
MCHC: 33.2 g/dL (ref 30.0–36.0)
MCV: 89.3 fl (ref 78.0–100.0)
MONO ABS: 0.7 10*3/uL (ref 0.1–1.0)
Monocytes Relative: 8.5 % (ref 3.0–12.0)
NEUTROS PCT: 74.2 % (ref 43.0–77.0)
Neutro Abs: 5.8 10*3/uL (ref 1.4–7.7)
PLATELETS: 333 10*3/uL (ref 150.0–400.0)
RBC: 4.38 Mil/uL (ref 3.87–5.11)
RDW: 14.1 % (ref 11.5–15.5)
WBC: 7.8 10*3/uL (ref 4.0–10.5)

## 2016-10-01 LAB — RESPIRATORY ALLERGY PROFILE REGION II ~~LOC~~
ALLERGEN, A. ALTERNATA, M6: 20.8 kU/L — AB
ALLERGEN, C. HERBARUM, M2: 6.93 kU/L — AB
ALLERGEN, D PTERNOYSSINUS, D1: 1.1 kU/L — AB
ALLERGEN, MULBERRY, T70: 0.36 kU/L — AB
ALLERGEN, OAK, T7: 0.47 kU/L — AB
ASPERGILLUS FUMIGATUS M3: 12 kU/L — AB
Allergen, Cedar tree, t12: 1.12 kU/L — ABNORMAL HIGH
Allergen, Comm Silver Birch, t9: 0.58 kU/L — ABNORMAL HIGH
Allergen, Cottonwood, t14: 1.36 kU/L — ABNORMAL HIGH
Allergen, Mouse Urine Protein, e78: <0.1 kU/L
Allergen, P. notatum, m1: 3.84 kU/L — ABNORMAL HIGH
BOX ELDER: 0.93 kU/L — AB
Bermuda Grass: 0.84 kU/L — ABNORMAL HIGH
CAT DANDER: 0.64 kU/L — AB
COCKROACH: 2.04 kU/L — AB
Common Ragweed: 0.82 kU/L — ABNORMAL HIGH
D. FARINAE: 0.74 kU/L — AB
Dog Dander: 34.3 kU/L — ABNORMAL HIGH
Elm IgE: 3.14 kU/L — ABNORMAL HIGH
IgE (Immunoglobulin E), Serum: 3120 kU/L — ABNORMAL HIGH (ref <115)
JOHNSON GRASS: 0.93 kU/L — AB
PECAN/HICKORY TREE IGE: 0.99 kU/L — AB
ROUGH PIGWEED IGE: 1.27 kU/L — AB
Sheep Sorrel IgE: 0.62 kU/L — ABNORMAL HIGH
Timothy Grass: 1.21 kU/L — ABNORMAL HIGH

## 2016-10-15 ENCOUNTER — Telehealth: Payer: Self-pay | Admitting: Pulmonary Disease

## 2016-10-15 NOTE — Telephone Encounter (Signed)
RAST 09/30/16 >> Multiple allergens especially aspergillus, fungus, dog dander.  IgE 3120.    Please inform pt that blood test shows multiple allergens but especially different molds, fungus, and dog dander.    Will call her back after review of her high resolution CT chest to discuss if additional therapies are needed.

## 2016-10-16 NOTE — Telephone Encounter (Signed)
Left message for pt to call us back x1.

## 2016-10-30 NOTE — Telephone Encounter (Signed)
Spoke with pt regarding the results of her labwork and asked pt if she has had a CT scan yet. Pt stated to me that she has not had it and does not know if one has been set up yet to be done.  Will route this message to [facility] to see if they can get pt scheduled for the HRCT. Pt states that she needs to have this done on either a Monday or Tuesday if able.  Pt to see Dr. Halford Chessman 11/14/16.

## 2016-10-31 NOTE — Telephone Encounter (Signed)
appt scheduled for 11/03/16 at 9:30am pt aware no prep

## 2016-10-31 NOTE — Telephone Encounter (Signed)
Pt aware of scheduled time/date. Will close encounter as nothing further is needed at this time.

## 2016-11-03 ENCOUNTER — Ambulatory Visit (INDEPENDENT_AMBULATORY_CARE_PROVIDER_SITE_OTHER)
Admission: RE | Admit: 2016-11-03 | Discharge: 2016-11-03 | Disposition: A | Discharge status: Home / Self Care | Payer: Medicare Other | Source: Ambulatory Visit | Attending: Pulmonary Disease | Admitting: Pulmonary Disease

## 2016-11-03 DIAGNOSIS — D869 Sarcoidosis, unspecified: Secondary | ICD-10-CM

## 2016-11-07 ENCOUNTER — Ambulatory Visit: Payer: Medicare Other | Admitting: Pulmonary Disease

## 2016-11-10 ENCOUNTER — Ambulatory Visit (INDEPENDENT_AMBULATORY_CARE_PROVIDER_SITE_OTHER): Payer: Medicare Other | Admitting: Pulmonary Disease

## 2016-11-10 ENCOUNTER — Encounter: Payer: Self-pay | Admitting: Pulmonary Disease

## 2016-11-10 VITALS — BP 118/80 | HR 76 | Ht 59.5 in | Wt 226.0 lb

## 2016-11-10 DIAGNOSIS — R05 Cough: Secondary | ICD-10-CM | POA: Diagnosis not present

## 2016-11-10 DIAGNOSIS — J4550 Severe persistent asthma, uncomplicated: Secondary | ICD-10-CM

## 2016-11-10 DIAGNOSIS — D860 Sarcoidosis of lung: Secondary | ICD-10-CM | POA: Diagnosis not present

## 2016-11-10 DIAGNOSIS — Z6841 Body Mass Index (BMI) 40.0 and over, adult: Secondary | ICD-10-CM | POA: Diagnosis not present

## 2016-11-10 DIAGNOSIS — R0609 Other forms of dyspnea: Secondary | ICD-10-CM | POA: Diagnosis not present

## 2016-11-10 DIAGNOSIS — R058 Other specified cough: Secondary | ICD-10-CM

## 2016-11-10 LAB — PULMONARY FUNCTION TEST
DL/VA % pred: 113 %
DL/VA: 4.7 ml/min/mmHg/L
DLCO COR: 13.8 ml/min/mmHg
DLCO UNC % PRED: 76 %
DLCO UNC: 13.96 ml/min/mmHg
DLCO cor % pred: 75 %
FEF 25-75 PRE: 1.39 L/s
FEF 25-75 Post: 1.57 L/sec
FEF2575-%Change-Post: 12 %
FEF2575-%PRED-PRE: 90 %
FEF2575-%Pred-Post: 101 %
FEV1-%CHANGE-POST: 1 %
FEV1-%PRED-POST: 73 %
FEV1-%PRED-PRE: 72 %
FEV1-PRE: 1.12 L
FEV1-Post: 1.13 L
FEV1FVC-%Change-Post: 3 %
FEV1FVC-%PRED-PRE: 114 %
FEV6-%CHANGE-POST: -4 %
FEV6-%PRED-POST: 63 %
FEV6-%Pred-Pre: 65 %
FEV6-POST: 1.2 L
FEV6-Pre: 1.26 L
FEV6FVC-%PRED-POST: 104 %
FEV6FVC-%Pred-Pre: 104 %
FVC-%Change-Post: -1 %
FVC-%Pred-Post: 61 %
FVC-%Pred-Pre: 62 %
FVC-Post: 1.23 L
FVC-Pre: 1.26 L
PRE FEV6/FVC RATIO: 100 %
Post FEV1/FVC ratio: 92 %
Post FEV6/FVC ratio: 100 %
Pre FEV1/FVC ratio: 89 %
RV % PRED: 91 %
RV: 1.71 L
TLC % pred: 71 %
TLC: 3.12 L

## 2016-11-10 MED ORDER — PREDNISONE 5 MG PO TABS: 5.0000 mg | ORAL_TABLET | Freq: Every day | ORAL | 5 refills | Status: DC

## 2016-11-10 MED ORDER — ALBUTEROL SULFATE HFA 108 (90 BASE) MCG/ACT IN AERS: 1.0000 | INHALATION_SPRAY | RESPIRATORY_TRACT | 5 refills | Status: DC | PRN

## 2016-11-10 NOTE — Progress Notes (Signed)
Current Outpatient Prescriptions on File Prior to Visit  Medication Sig  . atorvastatin (LIPITOR) 40 MG tablet Take 40 mg by mouth daily.  . carvedilol (COREG) 12.5 MG tablet Take 12.5 mg by mouth 2 (two) times daily with a meal.  . Cholecalciferol (VITAMIN D) 2000 units CAPS Take 1 tablet by mouth as needed.  . fluticasone (FLONASE) 50 MCG/ACT nasal spray Place 1 spray into both nostrils daily.  . fluticasone (FLOVENT HFA) 44 MCG/ACT inhaler Inhale 2 puffs into the lungs 2 (two) times daily.  Marland Kitchen guaiFENesin (MUCINEX) 600 MG 12 hr tablet Take 1 tablet (600 mg total) by mouth 2 (two) times daily.  . montelukast (SINGULAIR) 10 MG tablet Take 1 tablet (10 mg total) by mouth at bedtime.   No current facility-administered medications on file prior to visit.      Chief Complaint  Patient presents with  . Follow-up    Pt had PFT prior to visit today and CT last week. Pt states that SOB hasn't worsen since last visit, coughing slowed down since using inhalers.      Pulmonary tests RAST 09/30/16 >> multiple allergens, IgE 3120 HRCT chest 11/03/16 >> patchy septal thickening, honeycombing, and bronchiectasis PFT 11/10/16 >> FEV1 1.13 (73%), FEV1% 92, TLC 3.12 (71%), DLCO 76%, no BD  Past medical history CVA, DM  Past surgical history, Family history, Social history, Allergies all reviewed.  Vital Signs BP 118/80 (BP Location: Left Arm, Cuff Size: Normal)   Pulse 76   Ht 4' 11.5" (1.511 m)   Wt 226 lb (102.5 kg)   SpO2 98%   BMI 44.88 kg/m   History of Present Illness Darlene Stafford is a 66 y.o. female with dyspnea.  CXR from 08/28/16 showed changes suggestive of ILD.  RAST showed multiple allergens and IgE 3120.  HRCT chest from 11/03/16 showed patchy septal thickening, honeycombing, and bronchiectasis.  PFT today shows mild restriction and diffusion defect.  She remains on 5 mg prednisone.  She feels that additional of flovent, singulair have helped.  She has intermittent cough with  clear sputum.  Most of this is coming from her sinuses.  She is not having wheeze, chest tightness, fever, or hemoptysis.  Denies leg swelling, reflux, skin rash, or joint pain.  She has appointment with eye doctor later this month.  She is planning to get flu shot with PCP.  Physical Exam  General - No distress, sitting in wheelchair Eyes - wears glasses ENT - No sinus tenderness, no oral exudate, no LAN Cardiac - s1s2 regular, no murmur Chest - No wheeze/rales/dullness Back - No focal tenderness Abd - Soft, non-tender Ext - No edema Neuro - Normal strength Skin - No rashes Psych - normal mood, and behavior   Assessment/Plan  Systemic sarcoidosis. - HRCT chest findings are in line with history of sarcoidosis - continue prednisone - she is to follow up with ophthalmology  Allergic asthma with elevated IgE. - she has improved since last visit - continue flovent, singulair, and prednisone - prn albuterol - don't think she needs additional assessment for biologic agents at this time  Upper airway cough syndrome with post nasal drip. - continue flonase, singulair, and OTC antihistamine  Obesity. - discussed importance of weight loss    Patient Instructions  Follow up in 4 months    Chesley Mires, MD [place] [facility]Critical [facility]/Sleep Pager:  [phone] 11/10/2016, 12:01 PM

## 2016-11-10 NOTE — Patient Instructions (Signed)
Follow up in 4 months 

## 2016-11-10 NOTE — Progress Notes (Signed)
PFT done today. 

## 2016-11-14 ENCOUNTER — Ambulatory Visit: Payer: Medicare Other | Admitting: Pulmonary Disease

## 2017-02-02 ENCOUNTER — Encounter (HOSPITAL_COMMUNITY): Payer: Self-pay

## 2017-02-02 ENCOUNTER — Emergency Department (HOSPITAL_COMMUNITY): Payer: Medicare Other

## 2017-02-02 ENCOUNTER — Other Ambulatory Visit: Payer: Self-pay

## 2017-02-02 ENCOUNTER — Inpatient Hospital Stay (HOSPITAL_COMMUNITY)
Admission: EM | Admit: 2017-02-02 | Discharge: 2017-02-05 | DRG: 194 | Disposition: A | Discharge status: Home / Self Care | Payer: Medicare Other | Attending: Nephrology | Admitting: Nephrology

## 2017-02-02 DIAGNOSIS — J189 Pneumonia, unspecified organism: Principal | ICD-10-CM | POA: Diagnosis present

## 2017-02-02 DIAGNOSIS — I4510 Unspecified right bundle-branch block: Secondary | ICD-10-CM | POA: Diagnosis present

## 2017-02-02 DIAGNOSIS — Z7952 Long term (current) use of systemic steroids: Secondary | ICD-10-CM

## 2017-02-02 DIAGNOSIS — I959 Hypotension, unspecified: Secondary | ICD-10-CM

## 2017-02-02 DIAGNOSIS — A419 Sepsis, unspecified organism: Secondary | ICD-10-CM

## 2017-02-02 DIAGNOSIS — N179 Acute kidney failure, unspecified: Secondary | ICD-10-CM

## 2017-02-02 DIAGNOSIS — J45909 Unspecified asthma, uncomplicated: Secondary | ICD-10-CM | POA: Diagnosis present

## 2017-02-02 DIAGNOSIS — E1122 Type 2 diabetes mellitus with diabetic chronic kidney disease: Secondary | ICD-10-CM | POA: Diagnosis present

## 2017-02-02 DIAGNOSIS — E876 Hypokalemia: Secondary | ICD-10-CM | POA: Diagnosis present

## 2017-02-02 DIAGNOSIS — Z23 Encounter for immunization: Secondary | ICD-10-CM

## 2017-02-02 DIAGNOSIS — E1165 Type 2 diabetes mellitus with hyperglycemia: Secondary | ICD-10-CM | POA: Diagnosis present

## 2017-02-02 DIAGNOSIS — N183 Chronic kidney disease, stage 3 unspecified: Secondary | ICD-10-CM | POA: Diagnosis present

## 2017-02-02 DIAGNOSIS — J8410 Pulmonary fibrosis, unspecified: Secondary | ICD-10-CM | POA: Diagnosis present

## 2017-02-02 DIAGNOSIS — Z8673 Personal history of transient ischemic attack (TIA), and cerebral infarction without residual deficits: Secondary | ICD-10-CM

## 2017-02-02 DIAGNOSIS — J45901 Unspecified asthma with (acute) exacerbation: Secondary | ICD-10-CM | POA: Diagnosis present

## 2017-02-02 DIAGNOSIS — Z6841 Body Mass Index (BMI) 40.0 and over, adult: Secondary | ICD-10-CM

## 2017-02-02 DIAGNOSIS — Z91013 Allergy to seafood: Secondary | ICD-10-CM

## 2017-02-02 DIAGNOSIS — Z90710 Acquired absence of both cervix and uterus: Secondary | ICD-10-CM

## 2017-02-02 DIAGNOSIS — Z881 Allergy status to other antibiotic agents status: Secondary | ICD-10-CM

## 2017-02-02 DIAGNOSIS — D869 Sarcoidosis, unspecified: Secondary | ICD-10-CM | POA: Diagnosis present

## 2017-02-02 DIAGNOSIS — D649 Anemia, unspecified: Secondary | ICD-10-CM | POA: Diagnosis present

## 2017-02-02 DIAGNOSIS — Z7951 Long term (current) use of inhaled steroids: Secondary | ICD-10-CM

## 2017-02-02 DIAGNOSIS — D8689 Sarcoidosis of other sites: Secondary | ICD-10-CM | POA: Diagnosis present

## 2017-02-02 DIAGNOSIS — I129 Hypertensive chronic kidney disease with stage 1 through stage 4 chronic kidney disease, or unspecified chronic kidney disease: Secondary | ICD-10-CM | POA: Diagnosis present

## 2017-02-02 DIAGNOSIS — Z79899 Other long term (current) drug therapy: Secondary | ICD-10-CM

## 2017-02-02 MED ORDER — FENTANYL CITRATE (PF) 100 MCG/2ML IJ SOLN
50.0000 ug | Freq: Once | INTRAMUSCULAR | Status: AC
  Administered 2017-02-03: 50 ug via INTRAVENOUS
  Filled 2017-02-02: qty 2

## 2017-02-02 MED ORDER — SODIUM CHLORIDE 0.9 % IV BOLUS (SEPSIS)
1000.0000 mL | Freq: Once | INTRAVENOUS | Status: AC
  Administered 2017-02-03: 1000 mL via INTRAVENOUS

## 2017-02-02 NOTE — ED Triage Notes (Signed)
Pt reports weakness and diarrhea starting at 1p. She is hypotensive in triage. A&Ox4. No unilateral weakness or facial droop. Denies headache or vomiting. Ambulatory at home, but feels too weak to ambulate at this time.

## 2017-02-03 ENCOUNTER — Encounter (HOSPITAL_COMMUNITY): Payer: Self-pay | Admitting: Family Medicine

## 2017-02-03 ENCOUNTER — Inpatient Hospital Stay (HOSPITAL_COMMUNITY): Payer: Medicare Other

## 2017-02-03 ENCOUNTER — Emergency Department (HOSPITAL_COMMUNITY): Payer: Medicare Other

## 2017-02-03 DIAGNOSIS — J8410 Pulmonary fibrosis, unspecified: Secondary | ICD-10-CM | POA: Diagnosis present

## 2017-02-03 DIAGNOSIS — E876 Hypokalemia: Secondary | ICD-10-CM | POA: Diagnosis present

## 2017-02-03 DIAGNOSIS — I4510 Unspecified right bundle-branch block: Secondary | ICD-10-CM | POA: Diagnosis present

## 2017-02-03 DIAGNOSIS — N183 Chronic kidney disease, stage 3 unspecified: Secondary | ICD-10-CM | POA: Diagnosis present

## 2017-02-03 DIAGNOSIS — E1122 Type 2 diabetes mellitus with diabetic chronic kidney disease: Secondary | ICD-10-CM | POA: Diagnosis present

## 2017-02-03 DIAGNOSIS — D649 Anemia, unspecified: Secondary | ICD-10-CM | POA: Diagnosis present

## 2017-02-03 DIAGNOSIS — Z881 Allergy status to other antibiotic agents status: Secondary | ICD-10-CM | POA: Diagnosis not present

## 2017-02-03 DIAGNOSIS — Z23 Encounter for immunization: Secondary | ICD-10-CM | POA: Diagnosis not present

## 2017-02-03 DIAGNOSIS — J45901 Unspecified asthma with (acute) exacerbation: Secondary | ICD-10-CM

## 2017-02-03 DIAGNOSIS — E1165 Type 2 diabetes mellitus with hyperglycemia: Secondary | ICD-10-CM | POA: Diagnosis present

## 2017-02-03 DIAGNOSIS — Z6841 Body Mass Index (BMI) 40.0 and over, adult: Secondary | ICD-10-CM | POA: Diagnosis not present

## 2017-02-03 DIAGNOSIS — Z7952 Long term (current) use of systemic steroids: Secondary | ICD-10-CM | POA: Diagnosis not present

## 2017-02-03 DIAGNOSIS — N179 Acute kidney failure, unspecified: Secondary | ICD-10-CM | POA: Diagnosis present

## 2017-02-03 DIAGNOSIS — J189 Pneumonia, unspecified organism: Secondary | ICD-10-CM | POA: Diagnosis present

## 2017-02-03 DIAGNOSIS — Z90710 Acquired absence of both cervix and uterus: Secondary | ICD-10-CM | POA: Diagnosis not present

## 2017-02-03 DIAGNOSIS — D869 Sarcoidosis, unspecified: Secondary | ICD-10-CM | POA: Diagnosis present

## 2017-02-03 DIAGNOSIS — Z79899 Other long term (current) drug therapy: Secondary | ICD-10-CM | POA: Diagnosis not present

## 2017-02-03 DIAGNOSIS — Z7951 Long term (current) use of inhaled steroids: Secondary | ICD-10-CM | POA: Diagnosis not present

## 2017-02-03 DIAGNOSIS — I129 Hypertensive chronic kidney disease with stage 1 through stage 4 chronic kidney disease, or unspecified chronic kidney disease: Secondary | ICD-10-CM | POA: Diagnosis present

## 2017-02-03 DIAGNOSIS — Z8673 Personal history of transient ischemic attack (TIA), and cerebral infarction without residual deficits: Secondary | ICD-10-CM | POA: Diagnosis not present

## 2017-02-03 DIAGNOSIS — D8689 Sarcoidosis of other sites: Secondary | ICD-10-CM | POA: Diagnosis present

## 2017-02-03 DIAGNOSIS — Z91013 Allergy to seafood: Secondary | ICD-10-CM | POA: Diagnosis not present

## 2017-02-03 LAB — COMPREHENSIVE METABOLIC PANEL
ALBUMIN: 3.7 g/dL (ref 3.5–5.0)
ALK PHOS: 76 U/L (ref 38–126)
ALT: 13 U/L — AB (ref 14–54)
ANION GAP: 9 (ref 5–15)
AST: 21 U/L (ref 15–41)
BUN: 20 mg/dL (ref 6–20)
CALCIUM: 8.5 mg/dL — AB (ref 8.9–10.3)
CO2: 28 mmol/L (ref 22–32)
CREATININE: 2.36 mg/dL — AB (ref 0.44–1.00)
Chloride: 97 mmol/L — ABNORMAL LOW (ref 101–111)
GFR calc Af Amer: 24 mL/min — ABNORMAL LOW (ref >60)
GFR calc non Af Amer: 20 mL/min — ABNORMAL LOW (ref >60)
GLUCOSE: 165 mg/dL — AB (ref 65–99)
Potassium: 3.5 mmol/L (ref 3.5–5.1)
SODIUM: 134 mmol/L — AB (ref 135–145)
Total Bilirubin: 1.3 mg/dL — ABNORMAL HIGH (ref 0.3–1.2)
Total Protein: 7 g/dL (ref 6.5–8.1)

## 2017-02-03 LAB — I-STAT CG4 LACTIC ACID, ED: Lactic Acid, Venous: 1.26 mmol/L (ref 0.5–1.9)

## 2017-02-03 LAB — GLUCOSE, CAPILLARY
GLUCOSE-CAPILLARY: 198 mg/dL — AB (ref 65–99)
GLUCOSE-CAPILLARY: 181 mg/dL — AB (ref 65–99)
Glucose-Capillary: 114 mg/dL — ABNORMAL HIGH (ref 65–99)
Glucose-Capillary: 108 mg/dL — ABNORMAL HIGH (ref 65–99)

## 2017-02-03 LAB — URINALYSIS, ROUTINE W REFLEX MICROSCOPIC
Bilirubin Urine: NEGATIVE
Glucose, UA: NEGATIVE mg/dL
Hgb urine dipstick: NEGATIVE
Ketones, ur: 5 mg/dL — AB
Leukocytes, UA: NEGATIVE
Nitrite: NEGATIVE
Protein, ur: 30 mg/dL — AB
Specific Gravity, Urine: 1.024 (ref 1.005–1.030)
pH: 5 (ref 5.0–8.0)

## 2017-02-03 LAB — CBC
HCT: 37.6 % (ref 36.0–46.0)
HEMATOCRIT: 33.7 % — AB (ref 36.0–46.0)
HEMOGLOBIN: 11.2 g/dL — AB (ref 12.0–15.0)
Hemoglobin: 12.5 g/dL (ref 12.0–15.0)
MCH: 29.6 pg (ref 26.0–34.0)
MCH: 29.7 pg (ref 26.0–34.0)
MCHC: 33.2 g/dL (ref 30.0–36.0)
MCHC: 33.2 g/dL (ref 30.0–36.0)
MCV: 89.2 fL (ref 78.0–100.0)
MCV: 89.3 fL (ref 78.0–100.0)
Platelets: 281 K/uL (ref 150–400)
Platelets: 307 10*3/uL (ref 150–400)
RBC: 3.78 MIL/uL — AB (ref 3.87–5.11)
RBC: 4.21 MIL/uL (ref 3.87–5.11)
RDW: 12.8 % (ref 11.5–15.5)
RDW: 12.9 % (ref 11.5–15.5)
WBC: 13.1 10*3/uL — ABNORMAL HIGH (ref 4.0–10.5)
WBC: 16.1 K/uL — ABNORMAL HIGH (ref 4.0–10.5)

## 2017-02-03 LAB — LIPASE, BLOOD: Lipase: 31 U/L (ref 11–51)

## 2017-02-03 LAB — BASIC METABOLIC PANEL
Anion gap: 9 (ref 5–15)
BUN: 22 mg/dL — ABNORMAL HIGH (ref 6–20)
CO2: 28 mmol/L (ref 22–32)
Calcium: 8.1 mg/dL — ABNORMAL LOW (ref 8.9–10.3)
Chloride: 99 mmol/L — ABNORMAL LOW (ref 101–111)
Creatinine, Ser: 2.21 mg/dL — ABNORMAL HIGH (ref 0.44–1.00)
GFR calc Af Amer: 26 mL/min — ABNORMAL LOW (ref >60)
GFR, EST NON AFRICAN AMERICAN: 22 mL/min — AB (ref >60)
GLUCOSE: 123 mg/dL — AB (ref 65–99)
POTASSIUM: 3.4 mmol/L — AB (ref 3.5–5.1)
Sodium: 136 mmol/L (ref 135–145)

## 2017-02-03 LAB — INFLUENZA PANEL BY PCR (TYPE A & B)
INFLAPCR: NEGATIVE
Influenza B By PCR: NEGATIVE

## 2017-02-03 LAB — CBG MONITORING, ED: GLUCOSE-CAPILLARY: 153 mg/dL — AB (ref 65–99)

## 2017-02-03 LAB — CREATININE, URINE, RANDOM: CREATININE, URINE: 786.26 mg/dL

## 2017-02-03 LAB — SODIUM, URINE, RANDOM: Sodium, Ur: 26 mmol/L

## 2017-02-03 LAB — STREP PNEUMONIAE URINARY ANTIGEN: Strep Pneumo Urinary Antigen: NEGATIVE

## 2017-02-03 MED ORDER — SODIUM CHLORIDE 0.9 % IV BOLUS (SEPSIS)
2000.0000 mL | Freq: Once | INTRAVENOUS | Status: AC
  Administered 2017-02-03: 2000 mL via INTRAVENOUS

## 2017-02-03 MED ORDER — MONTELUKAST SODIUM 10 MG PO TABS
10.0000 mg | ORAL_TABLET | Freq: Every day | ORAL | Status: DC
  Administered 2017-02-03 – 2017-02-04 (×2): 10 mg via ORAL
  Filled 2017-02-03 (×2): qty 1

## 2017-02-03 MED ORDER — DEXTROSE 5 % IV SOLN
500.0000 mg | INTRAVENOUS | Status: DC
  Administered 2017-02-04 (×2): 500 mg via INTRAVENOUS
  Filled 2017-02-03 (×2): qty 500

## 2017-02-03 MED ORDER — INSULIN ASPART 100 UNIT/ML ~~LOC~~ SOLN
0.0000 [IU] | Freq: Three times a day (TID) | SUBCUTANEOUS | Status: DC
  Administered 2017-02-03: 2 [IU] via SUBCUTANEOUS
  Administered 2017-02-04 (×2): 3 [IU] via SUBCUTANEOUS
  Administered 2017-02-04 – 2017-02-05 (×2): 1 [IU] via SUBCUTANEOUS
  Administered 2017-02-05: 2 [IU] via SUBCUTANEOUS

## 2017-02-03 MED ORDER — ALBUTEROL SULFATE (2.5 MG/3ML) 0.083% IN NEBU
2.5000 mg | INHALATION_SOLUTION | RESPIRATORY_TRACT | Status: DC | PRN
  Administered 2017-02-04 (×2): 2.5 mg via RESPIRATORY_TRACT
  Filled 2017-02-03 (×2): qty 3

## 2017-02-03 MED ORDER — ACETAMINOPHEN 325 MG PO TABS
650.0000 mg | ORAL_TABLET | Freq: Once | ORAL | Status: AC
  Administered 2017-02-03: 650 mg via ORAL
  Filled 2017-02-03: qty 2

## 2017-02-03 MED ORDER — PREDNISONE 20 MG PO TABS
40.0000 mg | ORAL_TABLET | Freq: Every day | ORAL | Status: DC
  Administered 2017-02-03 – 2017-02-05 (×3): 40 mg via ORAL
  Filled 2017-02-03 (×3): qty 2

## 2017-02-03 MED ORDER — ATORVASTATIN CALCIUM 40 MG PO TABS
40.0000 mg | ORAL_TABLET | Freq: Every day | ORAL | Status: DC
  Administered 2017-02-03 – 2017-02-04 (×2): 40 mg via ORAL
  Filled 2017-02-03 (×2): qty 1

## 2017-02-03 MED ORDER — ONDANSETRON HCL 4 MG PO TABS: 4.0000 mg | ORAL_TABLET | Freq: Four times a day (QID) | ORAL | Status: DC | PRN

## 2017-02-03 MED ORDER — HYDROCODONE-ACETAMINOPHEN 5-325 MG PO TABS
1.0000 | ORAL_TABLET | ORAL | Status: DC | PRN
  Administered 2017-02-03 – 2017-02-04 (×3): 1 via ORAL
  Filled 2017-02-03 (×3): qty 1

## 2017-02-03 MED ORDER — BISACODYL 5 MG PO TBEC: 5.0000 mg | DELAYED_RELEASE_TABLET | Freq: Every day | ORAL | Status: DC | PRN

## 2017-02-03 MED ORDER — ONDANSETRON HCL 4 MG/2ML IJ SOLN: 4.0000 mg | Freq: Four times a day (QID) | INTRAMUSCULAR | Status: DC | PRN

## 2017-02-03 MED ORDER — CEFTRIAXONE SODIUM 1 G IJ SOLR
1.0000 g | Freq: Once | INTRAMUSCULAR | Status: AC
  Administered 2017-02-03: 1 g via INTRAVENOUS
  Filled 2017-02-03: qty 10

## 2017-02-03 MED ORDER — INFLUENZA VAC SPLIT HIGH-DOSE 0.5 ML IM SUSY
0.5000 mL | PREFILLED_SYRINGE | INTRAMUSCULAR | Status: AC
  Administered 2017-02-04: 0.5 mL via INTRAMUSCULAR
  Filled 2017-02-03: qty 0.5

## 2017-02-03 MED ORDER — GUAIFENESIN ER 600 MG PO TB12
600.0000 mg | ORAL_TABLET | Freq: Two times a day (BID) | ORAL | Status: DC
  Administered 2017-02-03 – 2017-02-05 (×5): 600 mg via ORAL
  Filled 2017-02-03 (×5): qty 1

## 2017-02-03 MED ORDER — HEPARIN SODIUM (PORCINE) 5000 UNIT/ML IJ SOLN
5000.0000 [IU] | Freq: Three times a day (TID) | INTRAMUSCULAR | Status: DC
  Administered 2017-02-03 – 2017-02-05 (×7): 5000 [IU] via SUBCUTANEOUS
  Filled 2017-02-03 (×7): qty 1

## 2017-02-03 MED ORDER — ACETAMINOPHEN 650 MG RE SUPP: 650.0000 mg | Freq: Four times a day (QID) | RECTAL | Status: DC | PRN

## 2017-02-03 MED ORDER — FLUTICASONE PROPIONATE HFA 44 MCG/ACT IN AERO: 2.0000 | INHALATION_SPRAY | Freq: Two times a day (BID) | RESPIRATORY_TRACT | Status: DC

## 2017-02-03 MED ORDER — INSULIN ASPART 100 UNIT/ML ~~LOC~~ SOLN
0.0000 [IU] | Freq: Every day | SUBCUTANEOUS | Status: DC
  Administered 2017-02-04: 2 [IU] via SUBCUTANEOUS

## 2017-02-03 MED ORDER — ACETAMINOPHEN 325 MG PO TABS: 650.0000 mg | ORAL_TABLET | Freq: Four times a day (QID) | ORAL | Status: DC | PRN

## 2017-02-03 MED ORDER — SODIUM CHLORIDE 0.9 % IV SOLN
INTRAVENOUS | Status: AC
  Administered 2017-02-03: 07:00:00 via INTRAVENOUS

## 2017-02-03 MED ORDER — POTASSIUM CHLORIDE CRYS ER 10 MEQ PO TBCR
10.0000 meq | EXTENDED_RELEASE_TABLET | Freq: Once | ORAL | Status: AC
  Administered 2017-02-03: 10 meq via ORAL
  Filled 2017-02-03: qty 1

## 2017-02-03 MED ORDER — DEXTROSE 5 % IV SOLN
1.0000 g | INTRAVENOUS | Status: DC
  Administered 2017-02-03 – 2017-02-04 (×2): 1 g via INTRAVENOUS
  Filled 2017-02-03 (×2): qty 10

## 2017-02-03 MED ORDER — BUDESONIDE 0.25 MG/2ML IN SUSP
0.2500 mg | Freq: Two times a day (BID) | RESPIRATORY_TRACT | Status: DC
  Administered 2017-02-03 – 2017-02-05 (×5): 0.25 mg via RESPIRATORY_TRACT
  Filled 2017-02-03 (×4): qty 2

## 2017-02-03 MED ORDER — SENNOSIDES-DOCUSATE SODIUM 8.6-50 MG PO TABS
1.0000 | ORAL_TABLET | Freq: Every evening | ORAL | Status: DC | PRN
Start: 2017-02-03 — End: 2017-02-05

## 2017-02-03 MED ORDER — DEXTROSE 5 % IV SOLN
500.0000 mg | Freq: Once | INTRAVENOUS | Status: AC
  Administered 2017-02-03: 500 mg via INTRAVENOUS
  Filled 2017-02-03: qty 500

## 2017-02-03 MED ORDER — FLUTICASONE PROPIONATE 50 MCG/ACT NA SUSP
1.0000 | Freq: Every day | NASAL | Status: DC
  Administered 2017-02-03 – 2017-02-05 (×3): 1 via NASAL
  Filled 2017-02-03: qty 16

## 2017-02-03 NOTE — ED Provider Notes (Addendum)
[facility] DEPT Provider Note   CSN: [id number] Arrival date & time: 02/02/17  2212     History   Chief Complaint Chief Complaint  Patient presents with  . Weakness  . Hypotension    HPI Jeraldin Fesler is a 67 y.o. female.  HPI 67 yo with weakness and diarrhea and nausea since today. Reports worsening left flank pain and weakness. Chills without documented fever. + cough without sig SOB. No new rash. Denies urinary symptoms. Symptoms are moderate in severity. Feels worse now. No CP   Past Medical History:  Diagnosis Date  . Asthma   . CVA (cerebral vascular accident) ([place])   . Diabetes mellitus without complication ([place])   . Sarcoidosis of other sites    Ocular    Patient Active Problem List   Diagnosis Date Noted  . Morbid obesity due to excess calories ([place])   . CAP (community acquired pneumonia) 04/10/2015  . Diabetes mellitus without complication ([place]) 55/73/2202  . Asthma 04/10/2015  . Acute encephalopathy 04/10/2015  . UTI (lower urinary tract infection) 04/10/2015  . AKI (acute kidney injury) ([place]) 04/10/2015  . Sepsis ([place]) 04/10/2015  . Hypotension 04/10/2015    Past Surgical History:  Procedure Laterality Date  . PARTIAL HYSTERECTOMY  1998  . REPLACEMENT TOTAL KNEE  2015    OB History    No data available       Home Medications    Prior to Admission medications   Medication Sig Start Date End Date Taking? Authorizing Provider  albuterol (PROVENTIL HFA;VENTOLIN HFA) 108 (90 Base) MCG/ACT inhaler Inhale 1-2 puffs into the lungs as needed. Patient taking differently: Inhale 1-2 puffs into the lungs every 4 (four) hours as needed for shortness of breath.  11/10/16  Yes Chesley Mires, MD  atorvastatin (LIPITOR) 40 MG tablet Take 40 mg by mouth daily.   Yes [provider]  carvedilol (COREG) 12.5 MG tablet Take 12.5 mg by mouth 2 (two) times daily with a meal.   Yes [provider]  fluticasone  (FLONASE) 50 MCG/ACT nasal spray Place 1 spray into both nostrils daily. 08/25/16  Yes Chesley Mires, MD  fluticasone (FLOVENT HFA) 44 MCG/ACT inhaler Inhale 2 puffs into the lungs 2 (two) times daily. 08/25/16  Yes Chesley Mires, MD  guaiFENesin (MUCINEX) 600 MG 12 hr tablet Take 1 tablet (600 mg total) by mouth 2 (two) times daily. 04/14/15  Yes Barton Dubois, MD  montelukast (SINGULAIR) 10 MG tablet Take 1 tablet (10 mg total) by mouth at bedtime. 08/22/16  Yes Chesley Mires, MD  predniSONE (DELTASONE) 5 MG tablet Take 1 tablet (5 mg total) by mouth daily with breakfast. 11/10/16  Yes Chesley Mires, MD    Family History Family History  Problem Relation Age of Onset  . Sarcoidosis Sister   . Breast cancer Sister   . Sarcoidosis Sister   . Sarcoidosis Sister     Social History Social History   Tobacco Use  . Smoking status: Never Smoker  . Smokeless tobacco: Never Used  Substance Use Topics  . Alcohol use: No  . Drug use: No     Allergies   Fish allergy; Shellfish allergy; and Sulfa antibiotics   Review of Systems Review of Systems  All other systems reviewed and are negative.    Physical Exam Updated Vital Signs BP (!) 87/50 (BP Location: Left Arm)   Pulse 79   Temp (!) 102 F (38.9 C) (Rectal)   Resp 18   SpO2  91%   Physical Exam  Constitutional: She is oriented to person, place, and time. She appears well-developed and well-nourished. No distress.  HENT:  Head: Normocephalic and atraumatic.  Eyes: EOM are normal.  Neck: Normal range of motion.  Cardiovascular: Normal rate, regular rhythm and normal heart sounds.  Pulmonary/Chest: Effort normal and breath sounds normal.  Abdominal: Soft. She exhibits no distension. There is no tenderness.  Genitourinary:  Genitourinary Comments: Left flank tenderness  Musculoskeletal: Normal range of motion.  Neurological: She is alert and oriented to person, place, and time.  Skin: Skin is warm and dry.  Psychiatric: She has a  normal mood and affect. Judgment normal.  Nursing note and vitals reviewed.    ED Treatments / Results  Labs (all labs ordered are listed, but only abnormal results are displayed) Labs Reviewed  CBC - Abnormal; Notable for the following components:      Result Value   WBC 16.1 (*)    All other components within normal limits  COMPREHENSIVE METABOLIC PANEL - Abnormal; Notable for the following components:   Sodium 134 (*)    Chloride 97 (*)    Glucose, Bld 165 (*)    Creatinine, Ser 2.36 (*)    Calcium 8.5 (*)    ALT 13 (*)    Total Bilirubin 1.3 (*)    GFR calc non Af Amer 20 (*)    GFR calc Af Amer 24 (*)    All other components within normal limits  CBG MONITORING, ED - Abnormal; Notable for the following components:   Glucose-Capillary 153 (*)    All other components within normal limits  CULTURE, BLOOD (ROUTINE X 2)  CULTURE, BLOOD (ROUTINE X 2)  URINE CULTURE  LIPASE, BLOOD  URINALYSIS, ROUTINE W REFLEX MICROSCOPIC  INFLUENZA PANEL BY PCR (TYPE A & B)  I-STAT CG4 LACTIC ACID, ED  I-STAT CG4 LACTIC ACID, ED    EKG  EKG Interpretation None       Radiology Dg Chest 2 View  Result Date: 02/02/2017 CLINICAL DATA:  67 y/o  F; cough and hypotension. EXAM: CHEST  2 VIEW COMPARISON:  11/03/2016 chest CT.  08/28/2016 chest radiograph FINDINGS: Stable cardiac silhouette given projection and technique. Right lung pulmonary fibrosis. Superimposed patchy opacities in the right lung. No pleural effusion or pneumothorax. Bones are unremarkable. IMPRESSION: Right lung pulmonary fibrosis. Superimposed patchy opacities of right lung may represent pneumonia. Electronically Signed   By: Kristine Garbe M.D.   On: 02/02/2017 23:50   Ct Renal Stone Study  Result Date: 02/03/2017 CLINICAL DATA:  Weakness and diarrhea for several hours. Elevated white cell count 16.1. History of diabetes and partial hysterectomy. GFR 24. EXAM: CT ABDOMEN AND PELVIS WITHOUT CONTRAST TECHNIQUE:  Multidetector CT imaging of the abdomen and pelvis was performed following the standard protocol without IV contrast. COMPARISON:  CT chest 11/03/2016 FINDINGS: Lower chest: Lung bases demonstrate interstitial fibrosis with honeycomb changes. Superimposed nodular infiltrates. Changes are similar to previous chest CT. Findings consistent with history of sarcoidosis. Hepatobiliary: No focal liver abnormality is seen. No gallstones, gallbladder wall thickening, or biliary dilatation. Pancreas: Unremarkable. No pancreatic ductal dilatation or surrounding inflammatory changes. Enlarged lymph nodes are around the head of the pancreas measuring up to 16 mm short axis dimension and containing calcifications. Spleen: Calcified granuloma in the spleen.  No splenic enlargement. Adrenals/Urinary Tract: Right kidney is atrophic. Focal scarring on the left kidney. No hydronephrosis or hydroureter. No renal stones or bladder stones. Phleboliths in the pelvis. Bladder  is decompressed. Stomach/Bowel: Stomach, small bowel, and colon are mostly decompressed. No inflammatory changes or wall thickening appreciated. Appendix is not definitively identified. Vascular/Lymphatic: Aortic atherosclerosis. No enlarged abdominal or pelvic lymph nodes. Reproductive: Status post hysterectomy. No adnexal masses. Other: No abdominal wall hernia or abnormality. No abdominopelvic ascites. Musculoskeletal: No acute or significant osseous findings. IMPRESSION: 1. Interstitial fibrosis with honeycomb changes and superimposed nodular infiltrates in the lung bases similar to previous CT scan of the chest. Findings likely represent changes of known sarcoidosis. 2. Upper abdominal lymph nodes with calcification and calcification in the spleen probably represent granulomas related to sarcoidosis. 3. No renal or ureteral stone or obstruction. Diffuse right renal atrophy. Scarring on the left kidney. 4. Aortic atherosclerosis. Electronically Signed   By:  Lucienne Capers M.D.   On: 02/03/2017 02:36    Procedures .Critical Care Performed by: Jola Schmidt, MD Authorized by: Jola Schmidt, MD     Total critical care time: 33 minutes Critical care time was exclusive of separately billable procedures and treating other patients. Critical care was necessary to treat or prevent imminent or life-threatening deterioration. Critical care was time spent personally by me on the following activities: development of treatment plan with patient and/or surrogate as well as nursing, discussions with consultants, evaluation of patient's response to treatment, examination of patient, obtaining history from patient or surrogate, ordering and performing treatments and interventions, ordering and review of laboratory studies, ordering and review of radiographic studies, pulse oximetry and re-evaluation of patient's condition.   Medications Ordered in ED Medications  acetaminophen (TYLENOL) tablet 650 mg (not administered)  sodium chloride 0.9 % bolus 1,000 mL (1,000 mLs Intravenous New Bag/Given 02/03/17 0025)  fentaNYL (SUBLIMAZE) injection 50 mcg (50 mcg Intravenous Given 02/03/17 0024)  cefTRIAXone (ROCEPHIN) 1 g in dextrose 5 % 50 mL IVPB (0 g Intravenous Stopped 02/03/17 0057)  azithromycin (ZITHROMAX) 500 mg in dextrose 5 % 250 mL IVPB (500 mg Intravenous New Bag/Given 02/03/17 0059)     Initial Impression / Assessment and Plan / ED Course  I have reviewed the triage vital signs and the nursing notes.  Pertinent labs & imaging results that were available during my care of the patient were reviewed by me and considered in my medical decision making (see chart for details).     Sepsis with fever and hypotension. Responding to fluids. Last SBP at 2:52 AM was 160 systolic. Urine pending. Covered with abx. Rocephin will cover urine too. CT abd/pelvis without abnormality. Admit for ongoing mgmt. Will need repeat lactate now  Final Clinical Impressions(s) / ED  Diagnoses   Final diagnoses:  None    ED Discharge Orders    None       Jola Schmidt, MD 02/03/17 [id number]    Jola Schmidt, MD 02/03/17 [phone]

## 2017-02-03 NOTE — Progress Notes (Signed)
[facility] PROGRESS NOTE  Darlene Stafford VHQ:[id number] DOB: 10-09-1950 DOA: 02/02/2017  PCP: Katherina Mires, MD  Brief History/Interval Summary: 67 year old African-American female with a past medical history of sarcoidosis, asthma, history of stroke, diet controlled diabetes, obesity who presented with increasing shortness of breath productive cough fever and chills.  Evaluation revealed concern for pneumonia.  Patient was hospitalized for further management.  Reason for Visit: Community-acquired pneumonia superimposed on sarcoidosis  Consultants: None  Procedures: None  Antibiotics: Ceftriaxone and azithromycin  Subjective/Interval History: Patient states that she is feeling well.  Continues to have a cough with clear expectoration. Somewhat short of breath at times.  Denies any chest pain.  No nausea or vomiting.  ROS: Denies any headaches.  Objective:  Vital Signs  Vitals:   02/03/17 0430 02/03/17 0524 02/03/17 0545 02/03/17 0952  BP: 129/71 (!) 119/52 109/67   Pulse: 79 78 83   Resp: (!) 27 (!) 22 (!) 22   Temp:   99.5 F (37.5 C)   TempSrc:   Oral   SpO2: 92% 92% 95% 93%  Weight:   107.8 kg (237 lb 10.5 oz)     Intake/Output Summary (Last 24 hours) at 02/03/2017 1214 Last data filed at 02/03/2017 4132 Gross per 24 hour  Intake 300 ml  Output -  Net 300 ml   Filed Weights   02/03/17 0545  Weight: 107.8 kg (237 lb 10.5 oz)    General appearance: alert, cooperative, appears stated age and no distress Head: Normocephalic, without obvious abnormality, atraumatic Resp: Crackles heard bilaterally.  Few rhonchi.  No wheezing.  Mildly tachypneic. Cardio: regular rate and rhythm, S1, S2 normal, no murmur, click, rub or gallop GI: soft, non-tender; bowel sounds normal; no masses,  no organomegaly Extremities: extremities normal, atraumatic, no cyanosis or edema Pulses: 2+ and symmetric Neurologic: No focal deficits  Lab Results:  Data Reviewed: I have  personally reviewed following labs and imaging studies  CBC: Recent Labs  Lab 02/03/17 0011 02/03/17 0639  WBC 16.1* 13.1*  HGB 12.5 11.2*  HCT 37.6 33.7*  MCV 89.3 89.2  PLT 281 440    Basic Metabolic Panel: Recent Labs  Lab 02/03/17 0011 02/03/17 0639  NA 134* 136  K 3.5 3.4*  CL 97* 99*  CO2 28 28  GLUCOSE 165* 123*  BUN 20 22*  CREATININE 2.36* 2.21*  CALCIUM 8.5* 8.1*    GFR: Estimated Creatinine Clearance: 27.6 mL/min (A) (by C-G formula based on SCr of 2.21 mg/dL (H)).  Liver Function Tests: Recent Labs  Lab 02/03/17 0011  AST 21  ALT 13*  ALKPHOS 76  BILITOT 1.3*  PROT 7.0  ALBUMIN 3.7    Recent Labs  Lab 02/03/17 0011  LIPASE 31    CBG: Recent Labs  Lab 02/03/17 0019 02/03/17 0800 02/03/17 1110  GLUCAP 153* 114* 108*    Radiology Studies: Dg Chest 2 View  Result Date: 02/02/2017 CLINICAL DATA:  67 y/o  F; cough and hypotension. EXAM: CHEST  2 VIEW COMPARISON:  11/03/2016 chest CT.  08/28/2016 chest radiograph FINDINGS: Stable cardiac silhouette given projection and technique. Right lung pulmonary fibrosis. Superimposed patchy opacities in the right lung. No pleural effusion or pneumothorax. Bones are unremarkable. IMPRESSION: Right lung pulmonary fibrosis. Superimposed patchy opacities of right lung may represent pneumonia. Electronically Signed   By: Kristine Garbe M.D.   On: 02/02/2017 23:50   US Renal  Result Date: 02/03/2017 CLINICAL DATA:  Acute onset of renal insufficiency. EXAM: RENAL /  URINARY TRACT ULTRASOUND COMPLETE COMPARISON:  CT of the abdomen and pelvis performed earlier today at 2:16 a.m. FINDINGS: Right Kidney: Length: 6.2 cm. Mildly increased parenchymal echogenicity is noted. No mass or hydronephrosis visualized. Left Kidney: Length: 9.2 cm. Mildly increased parenchymal echogenicity is noted. No mass or hydronephrosis visualized. Bladder: Decompressed and not well characterized. IMPRESSION: 1. No evidence of  hydronephrosis. 2. Mildly increased renal parenchymal echogenicity raises concern for medical renal disease. 3. Right renal atrophy. Electronically Signed   By: Garald Balding M.D.   On: 02/03/2017 06:50   Ct Renal Stone Study  Result Date: 02/03/2017 CLINICAL DATA:  Weakness and diarrhea for several hours. Elevated white cell count 16.1. History of diabetes and partial hysterectomy. GFR 24. EXAM: CT ABDOMEN AND PELVIS WITHOUT CONTRAST TECHNIQUE: Multidetector CT imaging of the abdomen and pelvis was performed following the standard protocol without IV contrast. COMPARISON:  CT chest 11/03/2016 FINDINGS: Lower chest: Lung bases demonstrate interstitial fibrosis with honeycomb changes. Superimposed nodular infiltrates. Changes are similar to previous chest CT. Findings consistent with history of sarcoidosis. Hepatobiliary: No focal liver abnormality is seen. No gallstones, gallbladder wall thickening, or biliary dilatation. Pancreas: Unremarkable. No pancreatic ductal dilatation or surrounding inflammatory changes. Enlarged lymph nodes are around the head of the pancreas measuring up to 16 mm short axis dimension and containing calcifications. Spleen: Calcified granuloma in the spleen.  No splenic enlargement. Adrenals/Urinary Tract: Right kidney is atrophic. Focal scarring on the left kidney. No hydronephrosis or hydroureter. No renal stones or bladder stones. Phleboliths in the pelvis. Bladder is decompressed. Stomach/Bowel: Stomach, small bowel, and colon are mostly decompressed. No inflammatory changes or wall thickening appreciated. Appendix is not definitively identified. Vascular/Lymphatic: Aortic atherosclerosis. No enlarged abdominal or pelvic lymph nodes. Reproductive: Status post hysterectomy. No adnexal masses. Other: No abdominal wall hernia or abnormality. No abdominopelvic ascites. Musculoskeletal: No acute or significant osseous findings. IMPRESSION: 1. Interstitial fibrosis with honeycomb changes  and superimposed nodular infiltrates in the lung bases similar to previous CT scan of the chest. Findings likely represent changes of known sarcoidosis. 2. Upper abdominal lymph nodes with calcification and calcification in the spleen probably represent granulomas related to sarcoidosis. 3. No renal or ureteral stone or obstruction. Diffuse right renal atrophy. Scarring on the left kidney. 4. Aortic atherosclerosis. Electronically Signed   By: Lucienne Capers M.D.   On: 02/03/2017 02:36     Medications:  Scheduled: . atorvastatin  40 mg Oral q1800  . budesonide (PULMICORT) nebulizer solution  0.25 mg Nebulization BID  . fluticasone  1 spray Each Nare Daily  . guaiFENesin  600 mg Oral BID  . heparin  5,000 Units Subcutaneous Q8H  . [START ON 02/04/2017] Influenza vac split quadrivalent PF  0.5 mL Intramuscular Tomorrow-1000  . insulin aspart  0-5 Units Subcutaneous QHS  . insulin aspart  0-9 Units Subcutaneous TID WC  . montelukast  10 mg Oral QHS  . predniSONE  40 mg Oral Q breakfast   Continuous: . sodium chloride 100 mL/hr at 02/03/17 0639  . azithromycin    . cefTRIAXone (ROCEPHIN)  IV     WJX:BJYNWGNFAOZHY **OR** acetaminophen, albuterol, bisacodyl, HYDROcodone-acetaminophen, ondansetron **OR** ondansetron (ZOFRAN) IV, senna-docusate  Assessment/Plan:  Principal Problem:   CAP (community acquired pneumonia) Active Problems:   Asthma   AKI (acute kidney injury) ([place])   Sarcoidosis   CKD (chronic kidney disease), stage III ([place])    Community-acquired pneumonia superimposed on sarcoidosis Patient presented with worsening shortness of breath, increased cough and sputum production.  Patient has been placed on ceftriaxone and azithromycin which will be continued.  Follow-up on culture data.  She is also been placed on steroids which will be continued.  Lactic acid level was normal.  Influenza PCR negative.  Urinary strep pneumonia antigen negative.  History of sarcoidosis and  asthma with mild exacerbation Noted to be wheezing at the time of admission.  She has been placed on nebulizer treatments and steroids.  Continue to monitor for now.  Acute kidney injury superimposed on chronic kidney disease stage III/mild hypokalemia Baseline creatinine about 1.4.  Creatinine was 2.36 on admission.  Continue IV hydration.  No hydronephrosis noted on CT scan.  Creatinine slightly better this morning.  She is making urine.  History of essential hypertension Blood pressure was low at the time of initial presentation.  Improved with IV fluids.  Coreg has been held.  Continue to monitor.  Diet-controlled diabetes HbA1c was 5.8 in 2017.  Since patient will be on systemic steroids follow CBGs.  SSI.  Normocytic anemia No evidence of overt bleeding.  Monitor hemoglobin  Morbid obesity Body mass index is 47.2 kg/m.  DVT Prophylaxis: Subcutaneous heparin    Code Status: Full code Family Communication: Discussed with the patient and her niece Disposition Plan: Management as outlined above.  Mobilize.    LOS: 0 days   [facility] Pager [phone] 02/03/2017, 12:14 PM  If 7PM-7AM, please contact night-coverage at [email or web address], password [facility]

## 2017-02-03 NOTE — ED Notes (Signed)
Darlene Stafford granddaughter

## 2017-02-03 NOTE — ED Notes (Signed)
ED TO INPATIENT HANDOFF REPORT  Name/Age/Gender Darlene Stafford 67 y.o. female  Code Status    Code Status Orders  (From admission, onward)        Start     Ordered   02/03/17 0421  Full code  Continuous     02/03/17 0423    Code Status History    Date Active Date Inactive Code Status Order ID Comments User Context   04/11/2015 04:31 04/14/2015 18:46 Full Code [id number]  Theressa Millard, MD Inpatient      Home/SNF/Other Home  Chief Complaint Weakness; Hypotension  Level of Care/Admitting Diagnosis ED Disposition    ED Disposition Condition Comment   Admit  Hospital Area: [facility] [[id number]]  Level of Care: Med-Surg [16]  Diagnosis: CAP (community acquired pneumonia) [449675]  Admitting Physician: Vianne Bulls [[id number]]  Attending Physician: Vianne Bulls [[id number]]  Estimated length of stay: past midnight tomorrow  Certification:: I certify this patient will need inpatient services for at least 2 midnights  PT Class (Do Not Modify): Inpatient [101]  PT Acc Code (Do Not Modify): Private [1]       Medical History Past Medical History:  Diagnosis Date  . Asthma   . CVA (cerebral vascular accident) ([place])   . Diabetes mellitus without complication ([place])   . Sarcoidosis of other sites    Ocular    Allergies Allergies  Allergen Reactions  . Fish Allergy Anaphylaxis  . Shellfish Allergy Anaphylaxis  . Sulfa Antibiotics Hives and Itching    IV Location/Drains/Wounds Patient Lines/Drains/Airways Status   Active Line/Drains/Airways    Name:   Placement date:   Placement time:   Site:   Days:   Peripheral IV (Ped) 04/12/15 Forearm   04/12/15    2050     663          Labs/Imaging Results for orders placed or performed during the hospital encounter of 02/02/17 (from the past 48 hour(s))  CBC     Status: Abnormal   Collection Time: 02/03/17 12:11 AM  Result Value Ref Range   WBC 16.1 (H) 4.0 - 10.5 K/uL   RBC 4.21 3.87 - 5.11  MIL/uL   Hemoglobin 12.5 12.0 - 15.0 g/dL   HCT 37.6 36.0 - 46.0 %   MCV 89.3 78.0 - 100.0 fL   MCH 29.7 26.0 - 34.0 pg   MCHC 33.2 30.0 - 36.0 g/dL   RDW 12.8 11.5 - 15.5 %   Platelets 281 150 - 400 K/uL  Comprehensive metabolic panel     Status: Abnormal   Collection Time: 02/03/17 12:11 AM  Result Value Ref Range   Sodium 134 (L) 135 - 145 mmol/L   Potassium 3.5 3.5 - 5.1 mmol/L   Chloride 97 (L) 101 - 111 mmol/L   CO2 28 22 - 32 mmol/L   Glucose, Bld 165 (H) 65 - 99 mg/dL   BUN 20 6 - 20 mg/dL   Creatinine, Ser 2.36 (H) 0.44 - 1.00 mg/dL   Calcium 8.5 (L) 8.9 - 10.3 mg/dL   Total Protein 7.0 6.5 - 8.1 g/dL   Albumin 3.7 3.5 - 5.0 g/dL   AST 21 15 - 41 U/L   ALT 13 (L) 14 - 54 U/L   Alkaline Phosphatase 76 38 - 126 U/L   Total Bilirubin 1.3 (H) 0.3 - 1.2 mg/dL   GFR calc non Af Amer 20 (L) >60 mL/min   GFR calc Af Amer 24 (L) >60 mL/min  Comment: (NOTE) The eGFR has been calculated using the CKD EPI equation. This calculation has not been validated in all clinical situations. eGFR's persistently <60 mL/min signify possible Chronic Kidney Disease.    Anion gap 9 5 - 15  Lipase, blood     Status: None   Collection Time: 02/03/17 12:11 AM  Result Value Ref Range   Lipase 31 11 - 51 U/L  CBG monitoring, ED     Status: Abnormal   Collection Time: 02/03/17 12:19 AM  Result Value Ref Range   Glucose-Capillary 153 (H) 65 - 99 mg/dL  I-Stat CG4 Lactic Acid, ED     Status: None   Collection Time: 02/03/17  3:27 AM  Result Value Ref Range   Lactic Acid, Venous 1.26 0.5 - 1.9 mmol/L  Urinalysis, Routine w reflex microscopic     Status: Abnormal   Collection Time: 02/03/17  3:28 AM  Result Value Ref Range   Color, Urine AMBER (A) YELLOW    Comment: BIOCHEMICALS MAY BE AFFECTED BY COLOR   APPearance CLOUDY (A) CLEAR   Specific Gravity, Urine 1.024 1.005 - 1.030   pH 5.0 5.0 - 8.0   Glucose, UA NEGATIVE NEGATIVE mg/dL   Hgb urine dipstick NEGATIVE NEGATIVE   Bilirubin  Urine NEGATIVE NEGATIVE   Ketones, ur 5 (A) NEGATIVE mg/dL   Protein, ur 30 (A) NEGATIVE mg/dL   Nitrite NEGATIVE NEGATIVE   Leukocytes, UA NEGATIVE NEGATIVE   RBC / HPF 0-5 0 - 5 RBC/hpf   WBC, UA 0-5 0 - 5 WBC/hpf   Bacteria, UA MANY (A) NONE SEEN   Squamous Epithelial / LPF 0-5 (A) NONE SEEN   Mucus PRESENT    Hyaline Casts, UA PRESENT    Dg Chest 2 View  Result Date: 02/02/2017 CLINICAL DATA:  67 y/o  F; cough and hypotension. EXAM: CHEST  2 VIEW COMPARISON:  11/03/2016 chest CT.  08/28/2016 chest radiograph FINDINGS: Stable cardiac silhouette given projection and technique. Right lung pulmonary fibrosis. Superimposed patchy opacities in the right lung. No pleural effusion or pneumothorax. Bones are unremarkable. IMPRESSION: Right lung pulmonary fibrosis. Superimposed patchy opacities of right lung may represent pneumonia. Electronically Signed   By: Kristine Garbe M.D.   On: 02/02/2017 23:50   Ct Renal Stone Study  Result Date: 02/03/2017 CLINICAL DATA:  Weakness and diarrhea for several hours. Elevated white cell count 16.1. History of diabetes and partial hysterectomy. GFR 24. EXAM: CT ABDOMEN AND PELVIS WITHOUT CONTRAST TECHNIQUE: Multidetector CT imaging of the abdomen and pelvis was performed following the standard protocol without IV contrast. COMPARISON:  CT chest 11/03/2016 FINDINGS: Lower chest: Lung bases demonstrate interstitial fibrosis with honeycomb changes. Superimposed nodular infiltrates. Changes are similar to previous chest CT. Findings consistent with history of sarcoidosis. Hepatobiliary: No focal liver abnormality is seen. No gallstones, gallbladder wall thickening, or biliary dilatation. Pancreas: Unremarkable. No pancreatic ductal dilatation or surrounding inflammatory changes. Enlarged lymph nodes are around the head of the pancreas measuring up to 16 mm short axis dimension and containing calcifications. Spleen: Calcified granuloma in the spleen.  No splenic  enlargement. Adrenals/Urinary Tract: Right kidney is atrophic. Focal scarring on the left kidney. No hydronephrosis or hydroureter. No renal stones or bladder stones. Phleboliths in the pelvis. Bladder is decompressed. Stomach/Bowel: Stomach, small bowel, and colon are mostly decompressed. No inflammatory changes or wall thickening appreciated. Appendix is not definitively identified. Vascular/Lymphatic: Aortic atherosclerosis. No enlarged abdominal or pelvic lymph nodes. Reproductive: Status post hysterectomy. No adnexal masses. Other: No  abdominal wall hernia or abnormality. No abdominopelvic ascites. Musculoskeletal: No acute or significant osseous findings. IMPRESSION: 1. Interstitial fibrosis with honeycomb changes and superimposed nodular infiltrates in the lung bases similar to previous CT scan of the chest. Findings likely represent changes of known sarcoidosis. 2. Upper abdominal lymph nodes with calcification and calcification in the spleen probably represent granulomas related to sarcoidosis. 3. No renal or ureteral stone or obstruction. Diffuse right renal atrophy. Scarring on the left kidney. 4. Aortic atherosclerosis. Electronically Signed   By: Lucienne Capers M.D.   On: 02/03/2017 02:36    Pending Labs Unresulted Labs (From admission, onward)   Start     Ordered   02/03/17 1886  Basic metabolic panel  Tomorrow morning,   R     02/03/17 0423   02/03/17 0500  CBC  Tomorrow morning,   R     02/03/17 0423   02/03/17 0425  Sodium, urine, random  Add-on,   R     02/03/17 0424   02/03/17 0425  Creatinine, urine, random  Add-on,   R     02/03/17 0424   02/03/17 0425  Urea nitrogen, urine  Add-on,   R     02/03/17 0424   02/03/17 0423  Strep pneumoniae urinary antigen  Add-on,   R     02/03/17 0423   02/03/17 0422  Culture, sputum-assessment  Once,   R     02/03/17 0423   02/03/17 0420  Gram stain  Once,   R     02/03/17 0423   02/03/17 0008  Influenza panel by PCR (type A & B)   (Influenza PCR Panel)  Once,   R     02/03/17 0007   02/02/17 2324  Urine culture  STAT,   STAT     02/02/17 2323   02/02/17 2316  Blood culture (routine x 2)  BLOOD CULTURE X 2,   STAT     02/02/17 2315      Vitals/Pain Today's Vitals   02/03/17 0143 02/03/17 0255 02/03/17 0300 02/03/17 0430  BP:  91/68  129/71  Pulse:  82  79  Resp:  (!) 23  (!) 27  Temp: (!) 102 F (38.9 C)     TempSrc: Rectal     SpO2:  91% 92% 92%  PainSc:        Isolation Precautions Droplet precaution  Medications Medications  acetaminophen (TYLENOL) tablet 650 mg (not administered)  sodium chloride 0.9 % bolus 2,000 mL (not administered)  atorvastatin (LIPITOR) tablet 40 mg (not administered)  fluticasone (FLONASE) 50 MCG/ACT nasal spray 1 spray (not administered)  fluticasone (FLOVENT HFA) 44 MCG/ACT inhaler 2 puff (not administered)  guaiFENesin (MUCINEX) 12 hr tablet 600 mg (not administered)  montelukast (SINGULAIR) tablet 10 mg (not administered)  heparin injection 5,000 Units (not administered)  0.9 %  sodium chloride infusion (not administered)  acetaminophen (TYLENOL) tablet 650 mg (not administered)    Or  acetaminophen (TYLENOL) suppository 650 mg (not administered)  HYDROcodone-acetaminophen (NORCO/VICODIN) 5-325 MG per tablet 1-2 tablet (not administered)  senna-docusate (Senokot-S) tablet 1 tablet (not administered)  bisacodyl (DULCOLAX) EC tablet 5 mg (not administered)  ondansetron (ZOFRAN) tablet 4 mg (not administered)    Or  ondansetron (ZOFRAN) injection 4 mg (not administered)  cefTRIAXone (ROCEPHIN) 1 g in dextrose 5 % 50 mL IVPB (not administered)  azithromycin (ZITHROMAX) 500 mg in dextrose 5 % 250 mL IVPB (not administered)  insulin aspart (novoLOG) injection 0-9 Units (not administered)  insulin aspart (novoLOG) injection 0-5 Units (not administered)  albuterol (PROVENTIL) (2.5 MG/3ML) 0.083% nebulizer solution 2.5 mg (not administered)  predniSONE (DELTASONE) tablet  40 mg (not administered)  sodium chloride 0.9 % bolus 1,000 mL (1,000 mLs Intravenous New Bag/Given 02/03/17 0025)  fentaNYL (SUBLIMAZE) injection 50 mcg (50 mcg Intravenous Given 02/03/17 0024)  cefTRIAXone (ROCEPHIN) 1 g in dextrose 5 % 50 mL IVPB (0 g Intravenous Stopped 02/03/17 0057)  azithromycin (ZITHROMAX) 500 mg in dextrose 5 % 250 mL IVPB (0 mg Intravenous Stopped 02/03/17 0453)    Mobility Walks (too week to walk at this time)

## 2017-02-03 NOTE — H&P (Signed)
History and Physical    Darlene Stafford NUU:[id number] DOB: 1950-12-15 DOA: 02/02/2017  PCP: Katherina Mires, MD   Patient coming from: Home  Chief Complaint: SOB, productive cough, left flank pain   HPI: Darlene Stafford is a 67 y.o. female with medical history significant for sarcoidosis, asthma, history of CVA, and diet-controlled diabetes, now presenting to the emergency department with increased shortness of breath, productive cough, fevers and chills, and left flank pain.  Patient reports that she had been in her usual state of health until 2-3 days ago when she noted the insidious development of worsening in her chronic dyspnea accompanied by a worsening cough productive of thick yellow sputum.  She has also developed some flank pain over the same interval, also worsening.  Pain is localized, moderate in severity, sharp in character, and without alleviating or exacerbating factors identified.  She denies any chest pain and denies swelling or tenderness in the lower extremities.  Denies dysuria or urinary urgency or frequency.  ED Course: Upon arrival to the ED, patient is found to be febrile to 38.9 C and with initial blood pressure of 87/50.  EKG features a sinus rhythm with RBBB.  Chest x-ray is notable for fibrotic changes with superimposed patchy opacities involving the right lung and concerning for a superimposed pneumonia.  Chemistry panel reveals a creatinine of 2.36, up from 1.37 in August.  CBC is notable for a leukocytosis to 16,100.  Urinalysis is unremarkable and lactic acid is reassuringly normal.  CT of the abdomen and pelvis is negative for acute intra-abdominal or pelvic findings.  Blood cultures were collected, 30 cc/kg NS was given, and the patient was started on empiric Rocephin and azithromycin.  Blood pressure improved with the IV fluids.  Patient will be admitted to the medical-surgical unit for ongoing evaluation and management of suspected community-acquired pneumonia with  exacerbation in her asthma.  Review of Systems:  All other systems reviewed and apart from HPI, are negative.  Past Medical History:  Diagnosis Date  . Asthma   . CVA (cerebral vascular accident) ([place])   . Diabetes mellitus without complication ([place])   . Sarcoidosis of other sites    Ocular    Past Surgical History:  Procedure Laterality Date  . PARTIAL HYSTERECTOMY  1998  . REPLACEMENT TOTAL KNEE  2015     reports that  has never smoked. she has never used smokeless tobacco. She reports that she does not drink alcohol or use drugs.  Allergies  Allergen Reactions  . Fish Allergy Anaphylaxis  . Shellfish Allergy Anaphylaxis  . Sulfa Antibiotics Hives and Itching    Family History  Problem Relation Age of Onset  . Sarcoidosis Sister   . Breast cancer Sister   . Sarcoidosis Sister   . Sarcoidosis Sister      Prior to Admission medications   Medication Sig Start Date End Date Taking? Authorizing Provider  albuterol (PROVENTIL HFA;VENTOLIN HFA) 108 (90 Base) MCG/ACT inhaler Inhale 1-2 puffs into the lungs as needed. Patient taking differently: Inhale 1-2 puffs into the lungs every 4 (four) hours as needed for shortness of breath.  11/10/16  Yes Chesley Mires, MD  atorvastatin (LIPITOR) 40 MG tablet Take 40 mg by mouth daily.   Yes [provider]  carvedilol (COREG) 12.5 MG tablet Take 12.5 mg by mouth 2 (two) times daily with a meal.   Yes [provider]  fluticasone (FLONASE) 50 MCG/ACT nasal spray Place 1 spray into both nostrils daily. 08/25/16  Yes Chesley Mires, MD  fluticasone (FLOVENT HFA) 44 MCG/ACT inhaler Inhale 2 puffs into the lungs 2 (two) times daily. 08/25/16  Yes Chesley Mires, MD  guaiFENesin (MUCINEX) 600 MG 12 hr tablet Take 1 tablet (600 mg total) by mouth 2 (two) times daily. 04/14/15  Yes Barton Dubois, MD  montelukast (SINGULAIR) 10 MG tablet Take 1 tablet (10 mg total) by mouth at bedtime. 08/22/16  Yes Chesley Mires, MD  predniSONE  (DELTASONE) 5 MG tablet Take 1 tablet (5 mg total) by mouth daily with breakfast. 11/10/16  Yes Chesley Mires, MD    Physical Exam: Vitals:   02/02/17 2229 02/03/17 0143 02/03/17 0255  BP: (!) 87/50  91/68  Pulse: 79  82  Resp: 18  (!) 23  Temp: 99.5 F (37.5 C) (!) 102 F (38.9 C)   TempSrc: Oral Rectal   SpO2: 91%  91%      Constitutional: NAD, calm, appears uncomfortable Eyes: PERTLA, lids and conjunctivae normal ENMT: Mucous membranes are moist. Posterior pharynx clear of any exudate or lesions.   Neck: normal, supple, no masses, no thyromegaly Respiratory: Diminished bilaterally with expiratory wheezes and coarse rhonchi most pronounced on right. No accessory muscle use.  Cardiovascular: S1 & S2 heard, regular rate and rhythm. No significant JVD. Abdomen: No distension, no tenderness, no masses palpated. Bowel sounds normal.  Musculoskeletal: no clubbing / cyanosis. No joint deformity upper and lower extremities.  Skin: no significant rashes, lesions, ulcers. Warm, dry, well-perfused. Neurologic: CN 2-12 grossly intact. Sensation intact. Strength 5/5 in all 4 limbs.  Psychiatric: Alert and oriented x 3. Pleasant and cooperative.     Labs on Admission: I have personally reviewed following labs and imaging studies  CBC: Recent Labs  Lab 02/03/17 0011  WBC 16.1*  HGB 12.5  HCT 37.6  MCV 89.3  PLT 371   Basic Metabolic Panel: Recent Labs  Lab 02/03/17 0011  NA 134*  K 3.5  CL 97*  CO2 28  GLUCOSE 165*  BUN 20  CREATININE 2.36*  CALCIUM 8.5*   GFR: CrCl cannot be calculated (Unknown ideal weight.). Liver Function Tests: Recent Labs  Lab 02/03/17 0011  AST 21  ALT 13*  ALKPHOS 76  BILITOT 1.3*  PROT 7.0  ALBUMIN 3.7   Recent Labs  Lab 02/03/17 0011  LIPASE 31   No results for input(s): AMMONIA in the last 168 hours. Coagulation Profile: No results for input(s): INR, PROTIME in the last 168 hours. Cardiac Enzymes: No results for input(s):  CKTOTAL, CKMB, CKMBINDEX, TROPONINI in the last 168 hours. BNP (last 3 results) No results for input(s): PROBNP in the last 8760 hours. HbA1C: No results for input(s): HGBA1C in the last 72 hours. CBG: Recent Labs  Lab 02/03/17 0019  GLUCAP 153*   Lipid Profile: No results for input(s): CHOL, HDL, LDLCALC, TRIG, CHOLHDL, LDLDIRECT in the last 72 hours. Thyroid Function Tests: No results for input(s): TSH, T4TOTAL, FREET4, T3FREE, THYROIDAB in the last 72 hours. Anemia Panel: No results for input(s): VITAMINB12, FOLATE, FERRITIN, TIBC, IRON, RETICCTPCT in the last 72 hours. Urine analysis:    Component Value Date/Time   COLORURINE AMBER (A) 02/03/2017 0328   APPEARANCEUR CLOUDY (A) 02/03/2017 0328   LABSPEC 1.024 02/03/2017 0328   PHURINE 5.0 02/03/2017 0328   GLUCOSEU NEGATIVE 02/03/2017 0328   HGBUR NEGATIVE 02/03/2017 0328   BILIRUBINUR NEGATIVE 02/03/2017 0328   KETONESUR 5 (A) 02/03/2017 0328   PROTEINUR 30 (A) 02/03/2017 0328   NITRITE NEGATIVE 02/03/2017 0328  LEUKOCYTESUR NEGATIVE 02/03/2017 0328   Sepsis Labs: @LABRCNTIP (procalcitonin:4,lacticidven:4) )No results found for this or any previous visit (from the past 240 hour(s)).   Radiological Exams on Admission: Dg Chest 2 View  Result Date: 02/02/2017 CLINICAL DATA:  67 y/o  F; cough and hypotension. EXAM: CHEST  2 VIEW COMPARISON:  11/03/2016 chest CT.  08/28/2016 chest radiograph FINDINGS: Stable cardiac silhouette given projection and technique. Right lung pulmonary fibrosis. Superimposed patchy opacities in the right lung. No pleural effusion or pneumothorax. Bones are unremarkable. IMPRESSION: Right lung pulmonary fibrosis. Superimposed patchy opacities of right lung may represent pneumonia. Electronically Signed   By: Kristine Garbe M.D.   On: 02/02/2017 23:50   Ct Renal Stone Study  Result Date: 02/03/2017 CLINICAL DATA:  Weakness and diarrhea for several hours. Elevated white cell count 16.1.  History of diabetes and partial hysterectomy. GFR 24. EXAM: CT ABDOMEN AND PELVIS WITHOUT CONTRAST TECHNIQUE: Multidetector CT imaging of the abdomen and pelvis was performed following the standard protocol without IV contrast. COMPARISON:  CT chest 11/03/2016 FINDINGS: Lower chest: Lung bases demonstrate interstitial fibrosis with honeycomb changes. Superimposed nodular infiltrates. Changes are similar to previous chest CT. Findings consistent with history of sarcoidosis. Hepatobiliary: No focal liver abnormality is seen. No gallstones, gallbladder wall thickening, or biliary dilatation. Pancreas: Unremarkable. No pancreatic ductal dilatation or surrounding inflammatory changes. Enlarged lymph nodes are around the head of the pancreas measuring up to 16 mm short axis dimension and containing calcifications. Spleen: Calcified granuloma in the spleen.  No splenic enlargement. Adrenals/Urinary Tract: Right kidney is atrophic. Focal scarring on the left kidney. No hydronephrosis or hydroureter. No renal stones or bladder stones. Phleboliths in the pelvis. Bladder is decompressed. Stomach/Bowel: Stomach, small bowel, and colon are mostly decompressed. No inflammatory changes or wall thickening appreciated. Appendix is not definitively identified. Vascular/Lymphatic: Aortic atherosclerosis. No enlarged abdominal or pelvic lymph nodes. Reproductive: Status post hysterectomy. No adnexal masses. Other: No abdominal wall hernia or abnormality. No abdominopelvic ascites. Musculoskeletal: No acute or significant osseous findings. IMPRESSION: 1. Interstitial fibrosis with honeycomb changes and superimposed nodular infiltrates in the lung bases similar to previous CT scan of the chest. Findings likely represent changes of known sarcoidosis. 2. Upper abdominal lymph nodes with calcification and calcification in the spleen probably represent granulomas related to sarcoidosis. 3. No renal or ureteral stone or obstruction. Diffuse  right renal atrophy. Scarring on the left kidney. 4. Aortic atherosclerosis. Electronically Signed   By: Lucienne Capers M.D.   On: 02/03/2017 02:36    EKG: Independently reviewed. Sinus rhythm, RBBB.   Assessment/Plan  1. CAP  - Presents with worsening SOB, increased cough, and increased sputum production  - Found to have fever, leukocytosis, AKI, and possible right-sided PNA superimposed on chronic changes of Sarcoidosis  - Treated in ED with nebs, supplemental O2, and empiric Rocephin and azithromycin  - Check sputum culture, strep pneumo antigen, resp virus panel, and procalcitonin  - Continue empiric abx, increase prednisone to 40 mg qD, continue prn supplemental O2   2. Sarcoidosis; asthma with acute exacerbation  - Dyspneic and wheezing on arrival, reports increased SOB, cough, and sputum-production for 2-3 days leading up to presentation  - Continue Atrovent, continue prn albuterol, check sputum culture and continue steroids and abx as above    3. Acute kidney injury superimposed on CKD stage III  - SCr is 2.36 on admission, up from 1.4 a few months ago  - She was hypotensive on arrival, suggesting a possible prerenal etiology  -  Check urine studies and renal US, renally-dose medications, repeat chem panel in am   4. Hx of hypertension  - BP was low on presentation, improved with IVF  - Coreg held on admission    5. Hyperglycemia  - A1c was 5.8% in 2017  - She will be on systemic steroids  - Follow CBG's and start SSI with Novolog     DVT prophylaxis: sq heparin  Code Status: Full  Family Communication: Discussed with patient Disposition Plan: Admit to med-surg Consults called: None Admission status: Inpatient    Vianne Bulls, MD [facility] Pager [phone]  If 7PM-7AM, please contact night-coverage [email or web address] Password [email or web address]  02/03/2017, 4:24 AM

## 2017-02-04 LAB — CBC
HCT: 30.5 % — ABNORMAL LOW (ref 36.0–46.0)
Hemoglobin: 10.6 g/dL — ABNORMAL LOW (ref 12.0–15.0)
MCH: 30 pg (ref 26.0–34.0)
MCHC: 34.8 g/dL (ref 30.0–36.0)
MCV: 86.4 fL (ref 78.0–100.0)
PLATELETS: 296 10*3/uL (ref 150–400)
RBC: 3.53 MIL/uL — AB (ref 3.87–5.11)
RDW: 12.7 % (ref 11.5–15.5)
WBC: 12 10*3/uL — ABNORMAL HIGH (ref 4.0–10.5)

## 2017-02-04 LAB — BASIC METABOLIC PANEL
Anion gap: 8 (ref 5–15)
BUN: 22 mg/dL — AB (ref 6–20)
CALCIUM: 8.2 mg/dL — AB (ref 8.9–10.3)
CO2: 26 mmol/L (ref 22–32)
CREATININE: 1.57 mg/dL — AB (ref 0.44–1.00)
Chloride: 101 mmol/L (ref 101–111)
GFR calc Af Amer: 39 mL/min — ABNORMAL LOW (ref >60)
GFR, EST NON AFRICAN AMERICAN: 33 mL/min — AB (ref >60)
Glucose, Bld: 176 mg/dL — ABNORMAL HIGH (ref 65–99)
Potassium: 3.7 mmol/L (ref 3.5–5.1)
SODIUM: 135 mmol/L (ref 135–145)

## 2017-02-04 LAB — GLUCOSE, CAPILLARY
GLUCOSE-CAPILLARY: 144 mg/dL — AB (ref 65–99)
Glucose-Capillary: 229 mg/dL — ABNORMAL HIGH (ref 65–99)
Glucose-Capillary: 227 mg/dL — ABNORMAL HIGH (ref 65–99)
Glucose-Capillary: 217 mg/dL — ABNORMAL HIGH (ref 65–99)

## 2017-02-04 LAB — URINE CULTURE: CULTURE: NO GROWTH

## 2017-02-04 LAB — UREA NITROGEN, URINE: Urea Nitrogen, Ur: 519 mg/dL

## 2017-02-04 NOTE — Progress Notes (Signed)
[facility] PROGRESS NOTE  Darlene Stafford PQZ:[id number] DOB: [birth year] DOA: 02/02/2017  PCP: Katherina Mires, MD  Brief History/Interval Summary: 67 year old African-American female with a past medical history of sarcoidosis, asthma, history of stroke, diet controlled diabetes, obesity who presented with increasing shortness of breath productive cough fever and chills.  Evaluation revealed concern for pneumonia.  Patient was hospitalized for further management.  Reason for Visit: Community-acquired pneumonia superimposed on sarcoidosis  Consultants: None  Procedures: None  Antibiotics: Ceftriaxone and azithromycin  Subjective/Interval History: Patient states that she is feeling better.  Cough has improved.  Shortness of breath has improved.  Denies any chest pain.  No nausea or vomiting.    ROS: Denies any headaches  Objective:  Vital Signs  Vitals:   02/03/17 2010 02/03/17 2159 02/04/17 0508 02/04/17 0900  BP:  (!) 126/57 (!) 116/53   Pulse:  84 74   Resp:  20 20   Temp:  98.4 F (36.9 C) (!) 97.3 F (36.3 C)   TempSrc:  Oral Oral   SpO2: 92% 99% 97% 94%  Weight:        Intake/Output Summary (Last 24 hours) at 02/04/2017 0947 Last data filed at 02/03/2017 1835 Gross per 24 hour  Intake 480 ml  Output -  Net 480 ml   Filed Weights   02/03/17 0545  Weight: 107.8 kg (237 lb 10.5 oz)    General appearance: Awake alert.  In no distress. Resp: Improved air entry bilaterally.  Continues to have crackles.  No rhonchi.  No wheezing. normal effort at rest. Cardio: S1-S2 is normal regular.  No S3-S4.  No rubs murmurs or bruit GI: Abdomen remains soft.  Nontender nondistended.  Bowel sounds are present.  No masses organomegaly Extremities: No edema Neurologic: No focal deficits  Lab Results:  Data Reviewed: I have personally reviewed following labs and imaging studies  CBC: Recent Labs  Lab 02/03/17 0011 02/03/17 0639 02/04/17 0540  WBC 16.1* 13.1* 12.0*    HGB 12.5 11.2* 10.6*  HCT 37.6 33.7* 30.5*  MCV 89.3 89.2 86.4  PLT 281 307 335    Basic Metabolic Panel: Recent Labs  Lab 02/03/17 0011 02/03/17 0639 02/04/17 0540  NA 134* 136 135  K 3.5 3.4* 3.7  CL 97* 99* 101  CO2 28 28 26   GLUCOSE 165* 123* 176*  BUN 20 22* 22*  CREATININE 2.36* 2.21* 1.57*  CALCIUM 8.5* 8.1* 8.2*    GFR: Estimated Creatinine Clearance: 38.8 mL/min (A) (by C-G formula based on SCr of 1.57 mg/dL (H)).  Liver Function Tests: Recent Labs  Lab 02/03/17 0011  AST 21  ALT 13*  ALKPHOS 76  BILITOT 1.3*  PROT 7.0  ALBUMIN 3.7    Recent Labs  Lab 02/03/17 0011  LIPASE 31    CBG: Recent Labs  Lab 02/03/17 0800 02/03/17 1110 02/03/17 1644 02/03/17 2156 02/04/17 0720  GLUCAP 114* 108* 198* 181* 144*    Radiology Studies: Dg Chest 2 View  Result Date: 02/02/2017 CLINICAL DATA:  67 y/o  F; cough and hypotension. EXAM: CHEST  2 VIEW COMPARISON:  11/03/2016 chest CT.  08/28/2016 chest radiograph FINDINGS: Stable cardiac silhouette given projection and technique. Right lung pulmonary fibrosis. Superimposed patchy opacities in the right lung. No pleural effusion or pneumothorax. Bones are unremarkable. IMPRESSION: Right lung pulmonary fibrosis. Superimposed patchy opacities of right lung may represent pneumonia. Electronically Signed   By: Kristine Garbe M.D.   On: 02/02/2017 23:50   US Renal  Result  Date: 02/03/2017 CLINICAL DATA:  Acute onset of renal insufficiency. EXAM: RENAL / URINARY TRACT ULTRASOUND COMPLETE COMPARISON:  CT of the abdomen and pelvis performed earlier today at 2:16 a.m. FINDINGS: Right Kidney: Length: 6.2 cm. Mildly increased parenchymal echogenicity is noted. No mass or hydronephrosis visualized. Left Kidney: Length: 9.2 cm. Mildly increased parenchymal echogenicity is noted. No mass or hydronephrosis visualized. Bladder: Decompressed and not well characterized. IMPRESSION: 1. No evidence of hydronephrosis. 2.  Mildly increased renal parenchymal echogenicity raises concern for medical renal disease. 3. Right renal atrophy. Electronically Signed   By: Garald Balding M.D.   On: 02/03/2017 06:50   Ct Renal Stone Study  Result Date: 02/03/2017 CLINICAL DATA:  Weakness and diarrhea for several hours. Elevated white cell count 16.1. History of diabetes and partial hysterectomy. GFR 24. EXAM: CT ABDOMEN AND PELVIS WITHOUT CONTRAST TECHNIQUE: Multidetector CT imaging of the abdomen and pelvis was performed following the standard protocol without IV contrast. COMPARISON:  CT chest 11/03/2016 FINDINGS: Lower chest: Lung bases demonstrate interstitial fibrosis with honeycomb changes. Superimposed nodular infiltrates. Changes are similar to previous chest CT. Findings consistent with history of sarcoidosis. Hepatobiliary: No focal liver abnormality is seen. No gallstones, gallbladder wall thickening, or biliary dilatation. Pancreas: Unremarkable. No pancreatic ductal dilatation or surrounding inflammatory changes. Enlarged lymph nodes are around the head of the pancreas measuring up to 16 mm short axis dimension and containing calcifications. Spleen: Calcified granuloma in the spleen.  No splenic enlargement. Adrenals/Urinary Tract: Right kidney is atrophic. Focal scarring on the left kidney. No hydronephrosis or hydroureter. No renal stones or bladder stones. Phleboliths in the pelvis. Bladder is decompressed. Stomach/Bowel: Stomach, small bowel, and colon are mostly decompressed. No inflammatory changes or wall thickening appreciated. Appendix is not definitively identified. Vascular/Lymphatic: Aortic atherosclerosis. No enlarged abdominal or pelvic lymph nodes. Reproductive: Status post hysterectomy. No adnexal masses. Other: No abdominal wall hernia or abnormality. No abdominopelvic ascites. Musculoskeletal: No acute or significant osseous findings. IMPRESSION: 1. Interstitial fibrosis with honeycomb changes and superimposed  nodular infiltrates in the lung bases similar to previous CT scan of the chest. Findings likely represent changes of known sarcoidosis. 2. Upper abdominal lymph nodes with calcification and calcification in the spleen probably represent granulomas related to sarcoidosis. 3. No renal or ureteral stone or obstruction. Diffuse right renal atrophy. Scarring on the left kidney. 4. Aortic atherosclerosis. Electronically Signed   By: Lucienne Capers M.D.   On: 02/03/2017 02:36     Medications:  Scheduled: . atorvastatin  40 mg Oral q1800  . budesonide (PULMICORT) nebulizer solution  0.25 mg Nebulization BID  . fluticasone  1 spray Each Nare Daily  . guaiFENesin  600 mg Oral BID  . heparin  5,000 Units Subcutaneous Q8H  . insulin aspart  0-5 Units Subcutaneous QHS  . insulin aspart  0-9 Units Subcutaneous TID WC  . montelukast  10 mg Oral QHS  . predniSONE  40 mg Oral Q breakfast   Continuous: . azithromycin 500 mg (02/04/17 0207)  . cefTRIAXone (ROCEPHIN)  IV Stopped (02/04/17 0207)   TOI:[id number] **OR** acetaminophen, albuterol, bisacodyl, HYDROcodone-acetaminophen, ondansetron **OR** ondansetron (ZOFRAN) IV, senna-docusate  Assessment/Plan:  Principal Problem:   CAP (community acquired pneumonia) Active Problems:   Asthma   AKI (acute kidney injury) ([place])   Sarcoidosis   CKD (chronic kidney disease), stage III ([place])    Community-acquired pneumonia superimposed on sarcoidosis Patient presented with worsening shortness of breath, increased cough and sputum production.  Patient was placed on ceftriaxone and azithromycin.  Patient is improving.  Cultures all negative so far.  Influenza PCR negative.  Urinary strep pneumonia antigen negative.  Lactic acid level was normal.  Continue current antibiotics for today and then transition to oral tomorrow.  Mobilize.   History of sarcoidosis and asthma with mild exacerbation Noted to be wheezing at the time of admission.  She has been  placed on nebulizer treatments and steroids.  Air entry has improved.  Not wheezing as much.  Continue higher dose of steroids for now.  She does take 5 mg of prednisone daily on a long-term basis.  Acute kidney injury superimposed on chronic kidney disease stage III/mild hypokalemia Baseline creatinine about 1.4.  Creatinine was 2.36 on admission.  Patient was given IV fluids.  Creatinine has improved.  Continue to monitor urine output.  No hydronephrosis noted on CT scan.    History of essential hypertension Blood pressure was low at the time of initial presentation.  Improved with IV fluids.  Coreg has been held.  Continue to monitor.  Diet-controlled diabetes HbA1c was 5.8 in 2017.  CBGs noted to be running high most likely due to higher dose of prednisone.  Continue SSI.  Normocytic anemia No evidence of overt bleeding.  Hemoglobin is low but stable.  Continue to monitor.  Morbid obesity Body mass index is 47.2 kg/m.  DVT Prophylaxis: Subcutaneous heparin    Code Status: Full code Family Communication: Discussed with patient. Disposition Plan: Management as outlined above.  Mobilize. PT Evaluation.    LOS: 1 day   [facility] Pager [phone] 02/04/2017, 9:47 AM  If 7PM-7AM, please contact night-coverage at [email or web address], password [facility]

## 2017-02-05 DIAGNOSIS — D869 Sarcoidosis, unspecified: Secondary | ICD-10-CM

## 2017-02-05 DIAGNOSIS — N179 Acute kidney failure, unspecified: Secondary | ICD-10-CM

## 2017-02-05 DIAGNOSIS — J189 Pneumonia, unspecified organism: Principal | ICD-10-CM

## 2017-02-05 DIAGNOSIS — N183 Chronic kidney disease, stage 3 (moderate): Secondary | ICD-10-CM

## 2017-02-05 LAB — BASIC METABOLIC PANEL
Anion gap: 9 (ref 5–15)
BUN: 15 mg/dL (ref 6–20)
CHLORIDE: 102 mmol/L (ref 101–111)
CO2: 26 mmol/L (ref 22–32)
CREATININE: 1.2 mg/dL — AB (ref 0.44–1.00)
Calcium: 8.8 mg/dL — ABNORMAL LOW (ref 8.9–10.3)
GFR calc non Af Amer: 46 mL/min — ABNORMAL LOW (ref >60)
GFR, EST AFRICAN AMERICAN: 53 mL/min — AB (ref >60)
Glucose, Bld: 153 mg/dL — ABNORMAL HIGH (ref 65–99)
Potassium: 3.8 mmol/L (ref 3.5–5.1)
SODIUM: 137 mmol/L (ref 135–145)

## 2017-02-05 LAB — CBC
HCT: 32.2 % — ABNORMAL LOW (ref 36.0–46.0)
Hemoglobin: 11 g/dL — ABNORMAL LOW (ref 12.0–15.0)
MCH: 29.8 pg (ref 26.0–34.0)
MCHC: 34.2 g/dL (ref 30.0–36.0)
MCV: 87.3 fL (ref 78.0–100.0)
Platelets: 322 K/uL (ref 150–400)
RBC: 3.69 MIL/uL — ABNORMAL LOW (ref 3.87–5.11)
RDW: 12.7 % (ref 11.5–15.5)
WBC: 10.8 K/uL — ABNORMAL HIGH (ref 4.0–10.5)

## 2017-02-05 LAB — GLUCOSE, CAPILLARY
GLUCOSE-CAPILLARY: 184 mg/dL — AB (ref 65–99)
Glucose-Capillary: 134 mg/dL — ABNORMAL HIGH (ref 65–99)

## 2017-02-05 MED ORDER — FLUTICASONE PROPIONATE HFA 44 MCG/ACT IN AERO: 2.0000 | INHALATION_SPRAY | Freq: Two times a day (BID) | RESPIRATORY_TRACT | 0 refills | Status: DC

## 2017-02-05 MED ORDER — ALBUTEROL SULFATE HFA 108 (90 BASE) MCG/ACT IN AERS: 1.0000 | INHALATION_SPRAY | RESPIRATORY_TRACT | 0 refills | Status: DC | PRN

## 2017-02-05 MED ORDER — LEVOFLOXACIN 500 MG PO TABS: 500.0000 mg | ORAL_TABLET | Freq: Every day | ORAL | 0 refills | Status: AC

## 2017-02-05 NOTE — Progress Notes (Signed)
Patient has discharged to home on 02/05/17. Discharge instructions including medications and appointments are given to patient by RN; patient has no questions.

## 2017-02-05 NOTE — Evaluation (Signed)
Occupational Therapy Evaluation Patient Details Name: Darlene Stafford MRN: [id number] DOB: 04/06/1950 Today's Date: 02/05/2017    History of Present Illness pt was admitted with CAP. H/O CVA   Clinical Impression   This 67 year old female was admitted for the above. At baseline, she is mod I and has 24/7 assistance.  Pt needs overall set up to min guard assist at this time. Goals are to return to mod I level    Follow Up Recommendations  Supervision/Assistance - 24 hour    Equipment Recommendations  None recommended by OT    Recommendations for Other Services       Precautions / Restrictions Precautions Precautions: Fall Restrictions Weight Bearing Restrictions: No      Mobility Bed Mobility Overal bed mobility: Modified Independent             General bed mobility comments: HOB raised  Transfers Overall transfer level: Needs assistance Equipment used: Rolling walker (2 wheeled) Transfers: Sit to/from Stand Sit to Stand: Supervision         General transfer comment: pt usually uses cane    Balance                                           ADL either performed or assessed with clinical judgement   ADL Overall ADL's : Needs assistance/impaired Eating/Feeding: Set up;Sitting   Grooming: Set up;Sitting   Upper Body Bathing: Sitting;Set up   Lower Body Bathing: Supervison/ safety;Sit to/from stand   Upper Body Dressing : Set up   Lower Body Dressing: Supervision/safety;Sit to/from stand   Toilet Transfer: +2 for safety/equipment;Min guard;Ambulation;RW   Toileting- Clothing Manipulation and Hygiene: Supervision/safety;Sit to/from stand         General ADL Comments: pt very fatiqued at time of evaluation:  all activities simulated except for donning socks. Walked to sink and returned to bed     Vision         Perception     Praxis      Pertinent Vitals/Pain Pain Assessment: No/denies pain     Hand Dominance      Extremity/Trunk Assessment Upper Extremity Assessment Upper Extremity Assessment: Generalized weakness;Overall WFL for tasks assessed(tremors esp RUE, strength equal to L)           Communication Communication Communication: No difficulties   Cognition Arousal/Alertness: Awake/alert Behavior During Therapy: WFL for tasks assessed/performed Overall Cognitive Status: Within Functional Limits for tasks assessed                                     General Comments       Exercises     Shoulder Instructions      Home Living Family/patient expects to be discharged to:: Private residence Living Arrangements: Other relatives Available Help at Discharge: Family;Skilled Nursing Facility               Bathroom Shower/Tub: Walk-in shower   Bathroom Toilet: Handicapped height     Home Equipment: Shower seat;Bedside commode;Wheelchair - manual          Prior Functioning/Environment Level of Independence: Independent with assistive device(s)                 OT Problem List: Decreased strength;Decreased activity tolerance;Pain;Impaired balance (sitting and/or standing)  OT Treatment/Interventions: Self-care/ADL training;DME and/or AE instruction;Patient/family education;Balance training    OT Goals(Current goals can be found in the care plan section) Acute Rehab OT Goals Patient Stated Goal: get some sleep OT Goal Formulation: With patient Time For Goal Achievement: 02/12/17 Potential to Achieve Goals: Good ADL Goals Pt Will Transfer to Toilet: with modified independence;bedside commode;ambulating Additional ADL Goal #1: pt will complete adl at mod I level  OT Frequency: Min 2X/week   Barriers to D/C:            Co-evaluation              AM-PAC PT "6 Clicks" Daily Activity     Outcome Measure Help from another person eating meals?: A Little Help from another person taking care of personal grooming?: A Little Help from another  person toileting, which includes using toliet, bedpan, or urinal?: A Little Help from another person bathing (including washing, rinsing, drying)?: A Little Help from another person to put on and taking off regular upper body clothing?: A Little Help from another person to put on and taking off regular lower body clothing?: A Little 6 Click Score: 18   End of Session    Activity Tolerance: Patient limited by fatigue Patient left: in bed;with call bell/phone within reach;with bed alarm set;with nursing/sitter in room  OT Visit Diagnosis: Muscle weakness (generalized) (M62.81)                Time: 4481-8563 OT Time Calculation (min): 14 min Charges:  OT General Charges $OT Visit: 1 Visit OT Evaluation $OT Eval Low Complexity: 1 Low G-Codes:     [place], OTR/L [phone] 02/05/2017  Darlene Stafford 02/05/2017, 9:13 AM

## 2017-02-05 NOTE — Progress Notes (Signed)
This CM contacted pt about orders for home health services. Pt politely declines home health services at this time. Marney Doctor RN,BSN,NCM [phone]

## 2017-02-05 NOTE — Discharge Summary (Addendum)
Physician Discharge Summary  Darlene Stafford [id number]:[id number] DOB: [birth year] DOA: 02/02/2017  PCP: Katherina Mires, MD  Admit date: 02/02/2017 Discharge date: 02/05/2017  Admitted From:home Disposition:home  Recommendations for Outpatient Follow-up:  1. Follow up with PCP in 1-2 weeks 2. Please obtain BMP/CBC in one week 3.  Home Health:yes Equipment/Devices:none Discharge Condition:stable CODE STATUS:full code Diet recommendation:heart healthy  Brief/Interim Summary: 67 year old African-American female with a past medical history of sarcoidosis, asthma, history of stroke, diet controlled diabetes, obesity who presented with increasing shortness of breath productive cough fever and chills.  Evaluation revealed concern for pneumonia.  Patient was hospitalized for further management.  #Community-acquired pneumonia: Chest x-ray with superimposed patchy opacity of right lung parenchyma in addition to right lung pulmonary fibrosis.  Treated with antibiotics, nebulization with clinical improvement.  Discharged with 5 more days of oral Levaquin to complete the course.  Patient is not hypoxic.  No shortness of breath or cough.  #History of sarcoidosis and asthma with mild exacerbation, undetermined: Improved on discharge.  Patient is on chronic oral prednisone for sarcoidosis.  #Acute kidney injury superimposed on chronic kidney disease stage III: Serum creatinine level improved to baseline.  Recommended to follow-up with PCP and monitor lab.  #History of hypertension, diet-controlled diabetes: Resume home medication and healthy diet.  Home care services ordered on discharge.  Recommend to follow-up with PCP in a week.  Patient is medically stable on discharge.  Discharge Diagnoses:  Principal Problem:   CAP (community acquired pneumonia) Active Problems:   Asthma   AKI (acute kidney injury) ([place])   Sarcoidosis   CKD (chronic kidney disease), stage III [facility])    Discharge  Instructions  Discharge Instructions    Call MD for:  difficulty breathing, headache or visual disturbances   Complete by:  As directed    Call MD for:  extreme fatigue   Complete by:  As directed    Call MD for:  hives   Complete by:  As directed    Call MD for:  persistant dizziness or light-headedness   Complete by:  As directed    Call MD for:  persistant nausea and vomiting   Complete by:  As directed    Call MD for:  severe uncontrolled pain   Complete by:  As directed    Call MD for:  temperature >100.4   Complete by:  As directed    Diet - low sodium heart healthy   Complete by:  As directed    Increase activity slowly   Complete by:  As directed      Allergies as of 02/05/2017      Reactions   Fish Allergy Anaphylaxis   Shellfish Allergy Anaphylaxis   Sulfa Antibiotics Hives, Itching      Medication List    TAKE these medications   albuterol 108 (90 Base) MCG/ACT inhaler Commonly known as:  PROVENTIL HFA;VENTOLIN HFA Inhale 1-2 puffs into the lungs every 4 (four) hours as needed for shortness of breath.   atorvastatin 40 MG tablet Commonly known as:  LIPITOR Take 40 mg by mouth daily.   carvedilol 12.5 MG tablet Commonly known as:  COREG Take 12.5 mg by mouth 2 (two) times daily with a meal.   fluticasone 44 MCG/ACT inhaler Commonly known as:  FLOVENT HFA Inhale 2 puffs into the lungs 2 (two) times daily.   fluticasone 50 MCG/ACT nasal spray Commonly known as:  FLONASE Place 1 spray into both nostrils daily.   guaiFENesin 600 MG 12  hr tablet Commonly known as:  MUCINEX Take 1 tablet (600 mg total) by mouth 2 (two) times daily.   levofloxacin 500 MG tablet Commonly known as:  LEVAQUIN Take 1 tablet (500 mg total) by mouth daily for 5 days.   montelukast 10 MG tablet Commonly known as:  SINGULAIR Take 1 tablet (10 mg total) by mouth at bedtime.   predniSONE 5 MG tablet Commonly known as:  DELTASONE Take 1 tablet (5 mg total) by mouth daily with  breakfast.      Follow-up Information    Katherina Mires, MD. Schedule an appointment as soon as possible for a visit in 1 week(s).   Specialty:  Family Medicine Contact information: [place] [address] [phone]          Allergies  Allergen Reactions  . Fish Allergy Anaphylaxis  . Shellfish Allergy Anaphylaxis  . Sulfa Antibiotics Hives and Itching    Consultations: None  Procedures/Studies: None  Subjective: Seen and examined at bedside.  Denies chest pain, shortness of breath, cough, nausea vomiting.   Discharge Exam: Vitals:   02/05/17 0630 02/05/17 0846  BP: 133/65   Pulse: 84   Resp: 18   Temp: 98.1 F (36.7 C)   SpO2: 96% 97%   Vitals:   02/04/17 2118 02/04/17 2220 02/05/17 0630 02/05/17 0846  BP: (!) 146/65  133/65   Pulse: 87  84   Resp: 18  18   Temp: 98.4 F (36.9 C)  98.1 F (36.7 C)   TempSrc: Oral  Oral   SpO2: 100% 96% 96% 97%  Weight:      Height:        General: Pt is alert, awake, not in acute distress Cardiovascular: RRR, S1/S2 +, no rubs, no gallops Respiratory: CTA bilaterally, no wheezing, no rhonchi Abdominal: Soft, NT, ND, bowel sounds + Extremities: no edema, no cyanosis    The results of significant diagnostics from this hospitalization (including imaging, microbiology, ancillary and laboratory) are listed below for reference.     Microbiology: Recent Results (from the past 240 hour(s))  Urine culture     Status: None   Collection Time: 02/03/17  3:28 AM  Result Value Ref Range Status   Specimen Description URINE, CLEAN CATCH  Final   Special Requests NONE  Final   Culture   Final    NO GROWTH Performed at [facility], [address], [place], [place] [address]    Report Status 02/04/2017 FINAL  Final  Blood culture (routine x 2)     Status: None (Preliminary result)   Collection Time: 02/03/17  6:39 AM  Result Value Ref Range Status   Specimen Description BLOOD  RIGHT ARM  Final   Special Requests   Final    BOTTLES DRAWN AEROBIC AND ANAEROBIC Blood Culture adequate volume   Culture   Final    NO GROWTH 2 DAYS Performed at [facility], [place] [address] [place], [place] [address]    Report Status PENDING  Incomplete  Blood culture (routine x 2)     Status: None (Preliminary result)   Collection Time: 02/03/17  6:47 AM  Result Value Ref Range Status   Specimen Description BLOOD RIGHT HAND  Final   Special Requests IN PEDIATRIC BOTTLE Blood Culture adequate volume  Final   Culture   Final    NO GROWTH 2 DAYS Performed at [facility], [place] [address], [place], [place] [address]  Report Status PENDING  Incomplete     Labs: BNP (last 3 results) No results for input(s): BNP in the last 8760 hours. Basic Metabolic Panel: Recent Labs  Lab 02/03/17 0011 02/03/17 0639 02/04/17 0540 02/05/17 0526  NA 134* 136 135 137  K 3.5 3.4* 3.7 3.8  CL 97* 99* 101 102  CO2 28 28 26 26   GLUCOSE 165* 123* 176* 153*  BUN 20 22* 22* 15  CREATININE 2.36* 2.21* 1.57* 1.20*  CALCIUM 8.5* 8.1* 8.2* 8.8*   Liver Function Tests: Recent Labs  Lab 02/03/17 0011  AST 21  ALT 13*  ALKPHOS 76  BILITOT 1.3*  PROT 7.0  ALBUMIN 3.7   Recent Labs  Lab 02/03/17 0011  LIPASE 31   No results for input(s): AMMONIA in the last 168 hours. CBC: Recent Labs  Lab 02/03/17 0011 02/03/17 0639 02/04/17 0540 02/05/17 0526  WBC 16.1* 13.1* 12.0* 10.8*  HGB 12.5 11.2* 10.6* 11.0*  HCT 37.6 33.7* 30.5* 32.2*  MCV 89.3 89.2 86.4 87.3  PLT 281 307 296 322   Cardiac Enzymes: No results for input(s): CKTOTAL, CKMB, CKMBINDEX, TROPONINI in the last 168 hours. BNP: Invalid input(s): POCBNP CBG: Recent Labs  Lab 02/04/17 0720 02/04/17 1140 02/04/17 1645 02/04/17 2119 02/05/17 0735  GLUCAP 144* 229* 227* 217* 134*   D-Dimer No results for input(s): DDIMER in the last 72 hours. Hgb A1c No results for input(s): HGBA1C in the last 72  hours. Lipid Profile No results for input(s): CHOL, HDL, LDLCALC, TRIG, CHOLHDL, LDLDIRECT in the last 72 hours. Thyroid function studies No results for input(s): TSH, T4TOTAL, T3FREE, THYROIDAB in the last 72 hours.  Invalid input(s): FREET3 Anemia work up No results for input(s): VITAMINB12, FOLATE, FERRITIN, TIBC, IRON, RETICCTPCT in the last 72 hours. Urinalysis    Component Value Date/Time   COLORURINE AMBER (A) 02/03/2017 0328   APPEARANCEUR CLOUDY (A) 02/03/2017 0328   LABSPEC 1.024 02/03/2017 0328   PHURINE 5.0 02/03/2017 0328   GLUCOSEU NEGATIVE 02/03/2017 0328   HGBUR NEGATIVE 02/03/2017 0328   BILIRUBINUR NEGATIVE 02/03/2017 0328   KETONESUR 5 (A) 02/03/2017 0328   PROTEINUR 30 (A) 02/03/2017 0328   NITRITE NEGATIVE 02/03/2017 0328   LEUKOCYTESUR NEGATIVE 02/03/2017 0328   Sepsis Labs Invalid input(s): PROCALCITONIN,  WBC,  LACTICIDVEN Microbiology Recent Results (from the past 240 hour(s))  Urine culture     Status: None   Collection Time: 02/03/17  3:28 AM  Result Value Ref Range Status   Specimen Description URINE, CLEAN CATCH  Final   Special Requests NONE  Final   Culture   Final    NO GROWTH Performed at [facility], [place] [address], [place], [place] [address]    Report Status 02/04/2017 FINAL  Final  Blood culture (routine x 2)     Status: None (Preliminary result)   Collection Time: 02/03/17  6:39 AM  Result Value Ref Range Status   Specimen Description BLOOD RIGHT ARM  Final   Special Requests   Final    BOTTLES DRAWN AEROBIC AND ANAEROBIC Blood Culture adequate volume   Culture   Final    NO GROWTH 2 DAYS Performed at [facility], [place] [address] [place], [place] [address]    Report Status PENDING  Incomplete  Blood culture (routine x 2)     Status: None (Preliminary result)   Collection Time: 02/03/17  6:47 AM  Result Value Ref Range Status   Specimen Description BLOOD RIGHT HAND  Final   Special Requests IN PEDIATRIC BOTTLE  Blood Culture adequate volume  Final   Culture   Final    NO GROWTH 2 DAYS Performed at [facility], [place] [address], [place], [place] [address]    Report Status PENDING  Incomplete     Time coordinating discharge: 28 minutes  SIGNED:   Rosita Fire, MD  [facility] 02/05/2017, 11:13 AM  If 7PM-7AM, please contact night-coverage [email or web address] Password [email or web address]

## 2017-02-05 NOTE — Progress Notes (Signed)
Inpatient Diabetes Program Recommendations  AACE/ADA: New Consensus Statement on Inpatient Glycemic Control (2015)  Target Ranges:  Prepandial:   less than 140 mg/dL      Peak postprandial:   less than 180 mg/dL (1-2 hours)      Critically ill patients:  140 - 180 mg/dL   Results for REMIE, MATHISON (MRN [id number]) as of 02/05/2017 09:04  Ref. Range 02/04/2017 07:20 02/04/2017 11:40 02/04/2017 16:45 02/04/2017 21:19  Glucose-Capillary Latest Ref Range: 65 - 99 mg/dL 144 (H) 229 (H) 227 (H) 217 (H)   Admit with: Pneumonia  History: DM2  Home DM Meds: None- Diet controlled  Current Insulin Orders: Novolog Sensitive Correction Scale/ SSI (0-9 units) TID AC + HS       MD- Note patient getting Prednisone 40 mg daily.    Afternoon CBGs >200 mg/dl likely due to the effects of the Prednisone.  Please consider increasing Novolog SSI to Moderate scale (0-15 units) TID AC + HS      --Will follow patient during hospitalization--  Wyn Quaker RN, MSN, CDE Diabetes Coordinator Inpatient Glycemic Control Team Team Pager: [phone] (8a-5p)

## 2017-02-08 LAB — CULTURE, BLOOD (ROUTINE X 2)
CULTURE: NO GROWTH
Culture: NO GROWTH
SPECIAL REQUESTS: ADEQUATE
Special Requests: ADEQUATE

## 2017-02-13 NOTE — Progress Notes (Deleted)
Reported that pt called unit today concerned about not receiving neb. Treatment prescription at [facility] on [address] when she was discharged on 1/3.  Reviewed pt discharged record and pt was not prescribed neb tx however, was prescribed albuterol inhaler, which prescription was sent to [facility] on [address] along with other scripts and pt has not picked up meds at this time.  Attempted to call pt to follow up, however, went straight to voicemail so I left her a message informing her of the albuterol inhaler script.

## 2017-03-10 ENCOUNTER — Ambulatory Visit: Payer: Medicare Other | Admitting: Pulmonary Disease

## 2017-04-13 ENCOUNTER — Ambulatory Visit (INDEPENDENT_AMBULATORY_CARE_PROVIDER_SITE_OTHER)
Admission: RE | Admit: 2017-04-13 | Discharge: 2017-04-13 | Disposition: A | Discharge status: Home / Self Care | Payer: Medicare Other | Source: Ambulatory Visit | Attending: Pulmonary Disease | Admitting: Pulmonary Disease

## 2017-04-13 ENCOUNTER — Ambulatory Visit (INDEPENDENT_AMBULATORY_CARE_PROVIDER_SITE_OTHER): Payer: Medicare Other | Admitting: Pulmonary Disease

## 2017-04-13 ENCOUNTER — Encounter: Payer: Self-pay | Admitting: Pulmonary Disease

## 2017-04-13 VITALS — BP 126/82 | HR 87 | Ht 60.0 in | Wt 228.4 lb

## 2017-04-13 DIAGNOSIS — R05 Cough: Secondary | ICD-10-CM

## 2017-04-13 DIAGNOSIS — D860 Sarcoidosis of lung: Secondary | ICD-10-CM

## 2017-04-13 DIAGNOSIS — Z6841 Body Mass Index (BMI) 40.0 and over, adult: Secondary | ICD-10-CM

## 2017-04-13 DIAGNOSIS — Z8701 Personal history of pneumonia (recurrent): Secondary | ICD-10-CM

## 2017-04-13 DIAGNOSIS — J4550 Severe persistent asthma, uncomplicated: Secondary | ICD-10-CM | POA: Diagnosis not present

## 2017-04-13 DIAGNOSIS — R058 Other specified cough: Secondary | ICD-10-CM

## 2017-04-13 MED ORDER — ALBUTEROL SULFATE (2.5 MG/3ML) 0.083% IN NEBU: 2.5000 mg | INHALATION_SOLUTION | Freq: Four times a day (QID) | RESPIRATORY_TRACT | 12 refills | Status: DC | PRN

## 2017-04-13 NOTE — Patient Instructions (Signed)
Chest xray today  Follow up in 6 months 

## 2017-04-13 NOTE — Progress Notes (Signed)
[facility]  Chief Complaint  Patient presents with  . Follow-up    Pt was recently in [facility] hospital for PNA-02-2017. Followed up with PCP already. Pt is out of neb med and needs refills. Pt is having slight cough-coming from PND.     Vital signs: BP 126/82 (BP Location: Left Arm, Cuff Size: Normal)   Pulse 87   Ht 5' (1.524 m)   Wt 228 lb 6.4 oz (103.6 kg)   SpO2 97%   BMI 44.61 kg/m   History of Present Illness: Darlene Stafford is a 67 y.o. female with sarcoidosis on chronic prednisone, allergic asthma, and upper airway cough syndrome.  She was in hospital with pneumonia in January.  She is feeling better.  She has intermittent cough with clear sputum.  She is not having fever, chest pain, wheeze, hemoptysis, joint pain, or leg swelling.  She uses flovent, flonase, singulair.  Not using albuterol much.  Uses 5 mg prednisone daily.  Needs refill for nebulizer medication.   Physical Exam:  General - pleasant Eyes - pupils reactive, wears glasses ENT - no sinus tenderness, no oral exudate, no LAN Cardiac - regular, no murmur Chest - no wheeze, rales Abd - soft, non tender Ext - no edema Skin - no rashes Neuro - normal strength Psych - normal mood  Assessment/Plan:  Systemic sarcoidosis. - continue 5 mg prednisone daily - f/u with ophthalmology  Allergic asthma with elevated IgE. - continue flovent, singulair, prednisone - prn albuterol - will send refill for nebulized albuterol  Upper airway cough syndrome with post nasal drip. - continue flonase, singulair, OTC antihistamine  Recent pneumonia. - will repeat chest xray to make sure previous infiltrate is cleared  Obesity. - discussed importance of weight loss   Patient Instructions  Chest xray today  Follow up in 6 months   Chesley Mires, MD [place] 04/13/2017, 12:54 PM Pager:  [phone]  Flow Sheet  Pulmonary tests: RAST 09/30/16 >>  multiple allergens, IgE 3120 HRCT chest 11/03/16 >> patchy septal thickening, honeycombing, and bronchiectasis PFT 11/10/16 >> FEV1 1.13 (73%), FEV1% 92, TLC 3.12 (71%), DLCO 76%, no BD  Past Medical History: She  has a past medical history of Asthma, CVA (cerebral vascular accident) ([place]), Diabetes mellitus without complication ([place]), and Sarcoidosis of other sites.  Past Surgical History: She  has a past surgical history that includes Replacement total knee (2015) and Partial hysterectomy (1998).  Family History: Her family history includes Breast cancer in her sister; Sarcoidosis in her sister, sister, and sister.  Social History: She  reports that  has never smoked. she has never used smokeless tobacco. She reports that she does not drink alcohol or use drugs.  Medications: Allergies as of 04/13/2017      Reactions   Fish Allergy Anaphylaxis   Shellfish Allergy Anaphylaxis   Sulfa Antibiotics Hives, Itching      Medication List        Accurate as of 04/13/17 12:54 PM. Always use your most recent med list.          albuterol 108 (90 Base) MCG/ACT inhaler Commonly known as:  PROVENTIL HFA;VENTOLIN HFA Inhale 1-2 puffs into the lungs every 4 (four) hours as needed for shortness of breath.   albuterol (2.5 MG/3ML) 0.083% nebulizer solution Commonly known as:  PROVENTIL Take 3 mLs (2.5 mg total) by nebulization every 6 (six) hours as needed for wheezing or shortness of breath.   atorvastatin 40 MG  tablet Commonly known as:  LIPITOR Take 40 mg by mouth daily.   carvedilol 12.5 MG tablet Commonly known as:  COREG Take 12.5 mg by mouth 2 (two) times daily with a meal.   fluticasone 44 MCG/ACT inhaler Commonly known as:  FLOVENT HFA Inhale 2 puffs into the lungs 2 (two) times daily.   fluticasone 50 MCG/ACT nasal spray Commonly known as:  FLONASE Place 1 spray into both nostrils daily.   guaiFENesin 600 MG 12 hr tablet Commonly known as:  MUCINEX Take 1 tablet (600  mg total) by mouth 2 (two) times daily.   montelukast 10 MG tablet Commonly known as:  SINGULAIR Take 1 tablet (10 mg total) by mouth at bedtime.   predniSONE 5 MG tablet Commonly known as:  DELTASONE Take 1 tablet (5 mg total) by mouth daily with breakfast.

## 2017-04-14 ENCOUNTER — Telehealth: Payer: Self-pay | Admitting: Pulmonary Disease

## 2017-04-14 NOTE — Telephone Encounter (Signed)
Dg Chest 2 View  Result Date: 04/13/2017 CLINICAL DATA:  Follow-up pneumonia. History of hypertension, asthma, sarcoid EXAM: CHEST - 2 VIEW COMPARISON:  Chest x-rays dated 02/02/2017 08/28/2016. Chest CT dated 11/03/2016. FINDINGS: Interstitial fibrosis again noted throughout the right lung, with associated volume loss, unchanged. Milder changes of fibrosis within the periphery of the left lung, corresponding to the findings on earlier chest CT, stable. No new opacities to suggest pneumonia. No pleural effusion or pneumothorax seen. Heart size and mediastinal contours are stable. Atherosclerotic changes noted at the aortic arch. No acute or suspicious osseous finding. IMPRESSION: 1. No active cardiopulmonary disease. No evidence of pneumonia or pulmonary edema. 2. Bilateral interstitial fibrosis, right greater than left, severe on the right and stable compared to appearance on earlier chest CT of 11/03/2016, compatible with history of sarcoid. Electronically Signed   By: Franki Cabot M.D.   On: 04/13/2017 16:16     Please let her know that previous changes from pneumonia have resolved.  No change to current medications.

## 2017-04-16 NOTE — Telephone Encounter (Signed)
Called and spoke with patient regarding results.  Informed the patient of results and recommendations today. Pt verbalized understanding and denied any questions or concerns at this time.  Nothing further needed.  

## 2017-04-29 ENCOUNTER — Telehealth: Payer: Self-pay | Admitting: Pulmonary Disease

## 2017-04-29 NOTE — Telephone Encounter (Signed)
Called and spoke with pt who stated she had a bad coughing spell last night and stated while coughing pt had an episode where she had coughed up some blood.  Pt stated blood she coughed up was bright red blood along with phlegm when she coughed.  Pt stated it was one time she had the blood.  Pt stated after that one episode, she has been fine but stated that VS told her to call our office if she had any episodes of coughing up blood.  Dr. Halford Chessman, please advise on recommendations for pt.

## 2017-04-29 NOTE — Telephone Encounter (Signed)
Pt scheduled to see TP tomorrow afternoon, aware of VS' recs to seek emergency care if she worsens between now and tomorrow's OV.  Nothing further needed.

## 2017-04-29 NOTE — Telephone Encounter (Signed)
She needs to get a chest xray.  If she can wait, then she needs to get an appointment in office tomorrow morning.  If her symptoms are not stable, then she needs to go to the emergency room.

## 2017-04-30 ENCOUNTER — Ambulatory Visit (INDEPENDENT_AMBULATORY_CARE_PROVIDER_SITE_OTHER): Payer: Medicare Other | Admitting: Adult Health

## 2017-04-30 ENCOUNTER — Encounter: Payer: Self-pay | Admitting: Adult Health

## 2017-04-30 ENCOUNTER — Ambulatory Visit (INDEPENDENT_AMBULATORY_CARE_PROVIDER_SITE_OTHER)
Admission: RE | Admit: 2017-04-30 | Discharge: 2017-04-30 | Disposition: A | Discharge status: Home / Self Care | Payer: Medicare Other | Source: Ambulatory Visit | Attending: Adult Health | Admitting: Adult Health

## 2017-04-30 VITALS — BP 124/72 | HR 81 | Ht 60.0 in | Wt 227.0 lb

## 2017-04-30 DIAGNOSIS — J209 Acute bronchitis, unspecified: Secondary | ICD-10-CM | POA: Diagnosis not present

## 2017-04-30 DIAGNOSIS — D869 Sarcoidosis, unspecified: Secondary | ICD-10-CM | POA: Diagnosis not present

## 2017-04-30 DIAGNOSIS — R042 Hemoptysis: Secondary | ICD-10-CM | POA: Diagnosis not present

## 2017-04-30 MED ORDER — AMOXICILLIN-POT CLAVULANATE 875-125 MG PO TABS: 1.0000 | ORAL_TABLET | Freq: Two times a day (BID) | ORAL | 0 refills | Status: AC

## 2017-04-30 MED ORDER — PREDNISONE 5 MG PO TABS: ORAL_TABLET | ORAL | 0 refills | Status: DC

## 2017-04-30 NOTE — Patient Instructions (Signed)
Augmentin 875mg . Twice daily  For 7 days , take with food.  Mucinex DM Twice daily  As needed  Cough/congestion  Fluids and rest  Increase Prednisone 10mg  daily for 1 week then 5mg  daily  Follow up with Dr. Halford Chessman  In 2-3 weeks and As needed   Please contact office for sooner follow up if symptoms do not improve or worsen or seek emergency care

## 2017-04-30 NOTE — Assessment & Plan Note (Signed)
Flare suspect coughing fit brought up blood tinged mucus . No further hemoptysis x 2 days .  Will watch closely  cxr today with chronic changes.   Plan  Patient Instructions  Augmentin 875mg . Twice daily  For 7 days , take with food.  Mucinex DM Twice daily  As needed  Cough/congestion  Fluids and rest  Increase Prednisone 10mg  daily for 1 week then 5mg  daily  Follow up with Dr. Halford Chessman  In 2-3 weeks and As needed   Please contact office for sooner follow up if symptoms do not improve or worsen or seek emergency care

## 2017-04-30 NOTE — Assessment & Plan Note (Signed)
Most likely secondary to acute bronchitis with severe sarcoid /bronchiectasis changes  Exam unrevealing  O2 sats good   Plan  Patient Instructions  Augmentin 875mg . Twice daily  For 7 days , take with food.  Mucinex DM Twice daily  As needed  Cough/congestion  Fluids and rest  Increase Prednisone 10mg  daily for 1 week then 5mg  daily  Follow up with Dr. Halford Chessman  In 2-3 weeks and As needed   Please contact office for sooner follow up if symptoms do not improve or worsen or seek emergency care

## 2017-04-30 NOTE — Assessment & Plan Note (Signed)
Mild flare with Acute bronchitis  cxr stable   Plan  Patient Instructions  Augmentin 875mg . Twice daily  For 7 days , take with food.  Mucinex DM Twice daily  As needed  Cough/congestion  Fluids and rest  Increase Prednisone 10mg  daily for 1 week then 5mg  daily  Follow up with Dr. Halford Chessman  In 2-3 weeks and As needed   Please contact office for sooner follow up if symptoms do not improve or worsen or seek emergency care

## 2017-04-30 NOTE — Progress Notes (Signed)
@Patient  ID: Darlene Stafford, female    DOB: Apr 26, 1950, 67 y.o.   MRN: [id number]  Chief Complaint  Patient presents with  . Acute Visit    Cough     Referring provider: Katherina Mires, MD  HPI: 67 year old female never smoker followed for sarcoidosis on chronic prednisone, allergic asthma and upper airway cough syndrome  Pulmonary tests: RAST 09/30/16 >> multiple allergens, IgE 3120 HRCT chest 11/03/16 >> patchy septal thickening, honeycombing, and bronchiectasis PFT 11/10/16 >> FEV1 1.13 (73%), FEV1% 92, TLC 3.12 (71%), DLCO 76%, no BD  04/30/2017 Acute OV : Cough  Patient presents for an acute office visit.  She complains of 1 week of increased cough congestion wheezing.  2 days ago patient had a severe coughing fit where she coughed up blood tinged mucus. She has not coughed up any more blood since that episode.  She denies any use of aspirin or anticoagulation medications.  She denies any chest pain orthopnea PND leg swelling calf pain or syncope. She denies any trouble swallowing.   Chest x-ray today shows chronic sarcoid changes right-sided predominant with no superimposed infiltrate   Allergies  Allergen Reactions  . Fish Allergy Anaphylaxis  . Shellfish Allergy Anaphylaxis  . Sulfa Antibiotics Hives and Itching    Immunization History  Administered Date(s) Administered  . Influenza, High Dose Seasonal PF 02/04/2017  . Influenza, Seasonal, Injecte, Preservative Fre 02/08/2014  . Influenza,inj,Quad PF,6+ Mos 12/02/2011, 12/02/2012, 12/27/2014, 11/30/2015  . Pneumococcal Conjugate-13 02/08/2014  . Pneumococcal Polysaccharide-23 11/30/2015    Past Medical History:  Diagnosis Date  . Asthma   . CVA (cerebral vascular accident) ([place])   . Diabetes mellitus without complication ([place])   . Sarcoidosis of other sites    Ocular    Tobacco History: Social History   Tobacco Use  Smoking Status Never Smoker  Smokeless Tobacco Never Used   Counseling given: Not  Answered   Outpatient Encounter Medications as of 04/30/2017  Medication Sig  . albuterol (PROVENTIL HFA;VENTOLIN HFA) 108 (90 Base) MCG/ACT inhaler Inhale 1-2 puffs into the lungs every 4 (four) hours as needed for shortness of breath.  Marland Kitchen albuterol (PROVENTIL) (2.5 MG/3ML) 0.083% nebulizer solution Take 3 mLs (2.5 mg total) by nebulization every 6 (six) hours as needed for wheezing or shortness of breath.  Marland Kitchen atorvastatin (LIPITOR) 40 MG tablet Take 40 mg by mouth daily.  . carvedilol (COREG) 12.5 MG tablet Take 12.5 mg by mouth 2 (two) times daily with a meal.  . fluticasone (FLONASE) 50 MCG/ACT nasal spray Place 1 spray into both nostrils daily.  . fluticasone (FLOVENT HFA) 44 MCG/ACT inhaler Inhale 2 puffs into the lungs 2 (two) times daily.  Marland Kitchen guaiFENesin (MUCINEX) 600 MG 12 hr tablet Take 1 tablet (600 mg total) by mouth 2 (two) times daily.  . montelukast (SINGULAIR) 10 MG tablet Take 1 tablet (10 mg total) by mouth at bedtime.  . predniSONE (DELTASONE) 5 MG tablet Take 1 tablet (5 mg total) by mouth daily with breakfast.  . amoxicillin-clavulanate (AUGMENTIN) 875-125 MG tablet Take 1 tablet by mouth 2 (two) times daily for 7 days.  . predniSONE (DELTASONE) 5 MG tablet 10mg  daily for 1 week, then resume normal dosing of 5mg  dail   No facility-administered encounter medications on file as of 04/30/2017.      Review of Systems  Constitutional:   No  weight loss, night sweats,  Fevers, chills,  +fatigue, or  lassitude.  HEENT:   No headaches,  Difficulty swallowing,  Tooth/dental problems, or  Sore throat,                No sneezing, itching, ear ache, nasal congestion, post nasal drip,   CV:  No chest pain,  Orthopnea, PND, swelling in lower extremities, anasarca, dizziness, palpitations, syncope.   GI  No heartburn, indigestion, abdominal pain, nausea, vomiting, diarrhea, change in bowel habits, loss of appetite, bloody stools.   Resp:    No chest wall deformity  Skin: no rash  or lesions.  GU: no dysuria, change in color of urine, no urgency or frequency.  No flank pain, no hematuria   MS:  No joint pain or swelling.  No decreased range of motion.  No back pain.    Physical Exam  BP 124/72 (BP Location: Left Arm, Cuff Size: Normal)   Pulse 81   Ht 5' (1.524 m)   Wt 227 lb (103 kg)   SpO2 93%   BMI 44.33 kg/m   GEN: A/Ox3; pleasant , NAD, obese    HEENT:  [place]/AT,  EACs-clear, TMs-wnl, NOSE-clear, THROAT-clear, no lesions, no postnasal drip or exudate noted.   NECK:  Supple w/ fair ROM; no JVD; normal carotid impulses w/o bruits; no thyromegaly or nodules palpated; no lymphadenopathy.    RESP  Few trace exp wheezes,  no accessory muscle use, no dullness to percussion  CARD:  RRR, no m/r/g, no peripheral edema, pulses intact, no cyanosis or clubbing. Neg homans sign .  No calf tenderness   GI:   Soft & nt; nml bowel sounds; no organomegaly or masses detected.   Musco: Warm bil, no deformities or joint swelling noted.   Neuro: alert, no focal deficits noted.    Skin: Warm, no lesions or rashes    Lab Results:  CBC   BMET  BNP  ProBNP No results found for: PROBNP  Imaging: Dg Chest 2 View  Result Date: 04/30/2017 CLINICAL DATA:  Hemoptysis EXAM: CHEST - 2 VIEW COMPARISON:  Chest radiograph 04/13/2017 FINDINGS: Unchanged interstitial opacities throughout the right lung. Mild rightward mediastinal shift is unchanged, due to right lung volume loss. There is left basilar atelectasis, unchanged. No new area of consolidation. No pleural effusion or pneumothorax. IMPRESSION: No acute airspace disease.  Unchanged interstitial fibrosis. Electronically Signed   By: Ulyses Jarred M.D.   On: 04/30/2017 15:30   Dg Chest 2 View  Result Date: 04/13/2017 CLINICAL DATA:  Follow-up pneumonia. History of hypertension, asthma, sarcoid EXAM: CHEST - 2 VIEW COMPARISON:  Chest x-rays dated 02/02/2017 08/28/2016. Chest CT dated 11/03/2016. FINDINGS:  Interstitial fibrosis again noted throughout the right lung, with associated volume loss, unchanged. Milder changes of fibrosis within the periphery of the left lung, corresponding to the findings on earlier chest CT, stable. No new opacities to suggest pneumonia. No pleural effusion or pneumothorax seen. Heart size and mediastinal contours are stable. Atherosclerotic changes noted at the aortic arch. No acute or suspicious osseous finding. IMPRESSION: 1. No active cardiopulmonary disease. No evidence of pneumonia or pulmonary edema. 2. Bilateral interstitial fibrosis, right greater than left, severe on the right and stable compared to appearance on earlier chest CT of 11/03/2016, compatible with history of sarcoid. Electronically Signed   By: Franki Cabot M.D.   On: 04/13/2017 16:16     Assessment & Plan:   Sarcoidosis Mild flare with Acute bronchitis  cxr stable   Plan  Patient Instructions  Augmentin 875mg . Twice daily  For 7 days , take with food.  Mucinex DM  Twice daily  As needed  Cough/congestion  Fluids and rest  Increase Prednisone 10mg  daily for 1 week then 5mg  daily  Follow up with Dr. Halford Chessman  In 2-3 weeks and As needed   Please contact office for sooner follow up if symptoms do not improve or worsen or seek emergency care      Acute bronchitis Flare suspect coughing fit brought up blood tinged mucus . No further hemoptysis x 2 days .  Will watch closely  cxr today with chronic changes.   Plan  Patient Instructions  Augmentin 875mg . Twice daily  For 7 days , take with food.  Mucinex DM Twice daily  As needed  Cough/congestion  Fluids and rest  Increase Prednisone 10mg  daily for 1 week then 5mg  daily  Follow up with Dr. Halford Chessman  In 2-3 weeks and As needed   Please contact office for sooner follow up if symptoms do not improve or worsen or seek emergency care       Hemoptysis Most likely secondary to acute bronchitis with severe sarcoid /bronchiectasis changes  Exam  unrevealing  O2 sats good   Plan  Patient Instructions  Augmentin 875mg . Twice daily  For 7 days , take with food.  Mucinex DM Twice daily  As needed  Cough/congestion  Fluids and rest  Increase Prednisone 10mg  daily for 1 week then 5mg  daily  Follow up with Dr. Halford Chessman  In 2-3 weeks and As needed   Please contact office for sooner follow up if symptoms do not improve or worsen or seek emergency care         Rexene Edison, NP 04/30/2017

## 2017-05-01 NOTE — Progress Notes (Signed)
Reviewed and agree with assessment/plan.   Raiyan Dalesandro, MD [facility] 01/30/2016, 12:24 PM Pager:  [phone]  

## 2017-05-19 ENCOUNTER — Ambulatory Visit: Payer: Medicare Other | Admitting: Adult Health

## 2017-07-27 ENCOUNTER — Telehealth: Payer: Self-pay | Admitting: Pulmonary Disease

## 2017-07-27 MED ORDER — ALBUTEROL SULFATE HFA 108 (90 BASE) MCG/ACT IN AERS: 1.0000 | INHALATION_SPRAY | RESPIRATORY_TRACT | 0 refills | Status: DC | PRN

## 2017-07-27 NOTE — Telephone Encounter (Signed)
I have sent in a refill for albuterol inhaler.Nothing further needed.

## 2017-09-07 ENCOUNTER — Other Ambulatory Visit: Payer: Self-pay | Admitting: Family Medicine

## 2017-09-07 DIAGNOSIS — R5381 Other malaise: Secondary | ICD-10-CM

## 2017-09-08 ENCOUNTER — Other Ambulatory Visit: Payer: Self-pay | Admitting: Family Medicine

## 2017-09-08 DIAGNOSIS — Z780 Asymptomatic menopausal state: Secondary | ICD-10-CM

## 2017-10-19 ENCOUNTER — Ambulatory Visit (INDEPENDENT_AMBULATORY_CARE_PROVIDER_SITE_OTHER): Payer: Medicare Other | Admitting: Pulmonary Disease

## 2017-10-19 ENCOUNTER — Encounter: Payer: Self-pay | Admitting: Pulmonary Disease

## 2017-10-19 VITALS — BP 124/80 | HR 72 | Ht 60.0 in | Wt 227.4 lb

## 2017-10-19 DIAGNOSIS — J4550 Severe persistent asthma, uncomplicated: Secondary | ICD-10-CM

## 2017-10-19 DIAGNOSIS — Z8701 Personal history of pneumonia (recurrent): Secondary | ICD-10-CM | POA: Diagnosis not present

## 2017-10-19 DIAGNOSIS — Z23 Encounter for immunization: Secondary | ICD-10-CM

## 2017-10-19 DIAGNOSIS — D869 Sarcoidosis, unspecified: Secondary | ICD-10-CM | POA: Diagnosis not present

## 2017-10-19 DIAGNOSIS — Z6841 Body Mass Index (BMI) 40.0 and over, adult: Secondary | ICD-10-CM

## 2017-10-19 MED ORDER — ALBUTEROL SULFATE HFA 108 (90 BASE) MCG/ACT IN AERS: 1.0000 | INHALATION_SPRAY | RESPIRATORY_TRACT | 0 refills | Status: DC | PRN

## 2017-10-19 MED ORDER — PREDNISONE 5 MG PO TABS: ORAL_TABLET | ORAL | 5 refills | Status: DC

## 2017-10-19 NOTE — Patient Instructions (Signed)
Prednisone 5 mg pill >> alternate 1 pill with 1/2 pill ever other day for 4 weeks.  If your breathing is okay, then change to 1/2 pill daily until next visit.  High dose flu shot today.  Follow up in 4 months

## 2017-10-19 NOTE — Progress Notes (Signed)
[facility]  Chief Complaint  Patient presents with  . Follow-up    Pt doing well overall. Pt is having sinus drainage off and on for last week. Pt has dry cough, not productive at this time. Pt is requesting flu shot today    Constitutional: BP 124/80 (BP Location: Left Arm, Cuff Size: Normal)   Pulse 72   Ht 5' (1.524 m)   Wt 227 lb 6.4 oz (103.1 kg)   SpO2 99%   BMI 44.41 kg/m   History of Present Illness: Darlene Stafford is a 67 y.o. female with sarcoidosis on chronic prednisone, allergic asthma, and upper airway cough syndrome.  She was treated for bronchitis in March.  Gets pneumonia every winter.  Has some sinus congestion with post nasal drip. Not having cough, wheeze, sputum, chest pain, hemoptysis, fever, skin rash, abdominal pain, or leg swelling.  Gets winded when she walks too much, but does okay at steady pace.  Remains on 5 mg prednisone daily.  She has trouble affording flovent.  CXR from 04/30/17 (reviewed by me) >> fibrotic changes on Rt side > than Lt side, with Rt lung volume loss.  Comprehensive Respiratory Exam:  Appearance - well kempt  ENMT - nasal mucosa moist, turbinates clear, midline nasal septum, no dental lesions, no gingival bleeding, no oral exudates, no tonsillar hypertrophy Neck - no masses, trachea midline, no thyromegaly, no elevation in JVP Respiratory - normal appearance of chest wall, normal respiratory effort w/o accessory muscle use, no dullness on percussion, no wheezing or rales CV - s1s2 regular rate and rhythm, no murmurs, no peripheral edema, radial pulses symmetric GI - soft, non tender, no masses Lymph - no adenopathy noted in neck and axillary areas MSK - normal muscle strength and tone, normal gait Ext - no cyanosis, clubbing, or joint inflammation noted Skin - no rashes, lesions, or ulcers Neuro - oriented to person, place, and time Psych - normal mood and affect  Discussion: She has been  on prednisone chronically for years since she was seen by pulmonary in [place], [place].  Explained that when she has flare ups, these could actually be related to asthma rather that worsening sarcoidosis.  Will try to gradually wean her off prednisone as able, and then determine is she needs adjustments to her asthma regimen.  Assessment/Plan:  Systemic sarcoidosis. - will change prednisone 5 mg alternating with 2.5 mg every other day for next few weeks; if stable then change to 2.5 mg daily - f/u with ophthalmology - high dose flu shot today  Allergic asthma with elevated IgE. - continue singulair, prednisone, prn albuterol - she will check whether there is alternative to flovent on her insurance formulary; also gave her financial assistance forms for flovent  Upper airway cough syndrome with post nasal drip. - continue flonase, singulair, prn OTC antihistamine  Recent pneumonia. - usually happens in Winter time - explained how steroid use can increase her risk for pneumonia - she is up to date with pneumococcal vaccinations  Obesity. - discussed options to assist with weight loss, including diet modification and exercise   Patient Instructions  Prednisone 5 mg pill >> alternate 1 pill with 1/2 pill ever other day for 4 weeks.  If your breathing is okay, then change to 1/2 pill daily until next visit.  High dose flu shot today.  Follow up in 4 months   Chesley Mires, MD [place] 10/19/2017, 10:09 AM Pager:  [phone]  Flow  Sheet  Pulmonary tests: RAST 09/30/16 >> multiple allergens, IgE 3120 HRCT chest 11/03/16 >> patchy septal thickening, honeycombing, and bronchiectasis PFT 11/10/16 >> FEV1 1.13 (73%), FEV1% 92, TLC 3.12 (71%), DLCO 76%, no BD  Past Medical History: She  has a past medical history of Asthma, CVA (cerebral vascular accident) ([place]), Diabetes mellitus without complication ([place]), and Sarcoidosis of other sites.  Past Surgical  History: She  has a past surgical history that includes Replacement total knee (2015) and Partial hysterectomy (1998).  Family History: Her family history includes Breast cancer in her sister; Sarcoidosis in her sister, sister, and sister.  Social History: She  reports that she has never smoked. She has never used smokeless tobacco. She reports that she does not drink alcohol or use drugs.  Medications: Allergies as of 10/19/2017      Reactions   Fish Allergy Anaphylaxis   Shellfish Allergy Anaphylaxis   Sulfa Antibiotics Hives, Itching      Medication List        Accurate as of 10/19/17 10:09 AM. Always use your most recent med list.          albuterol (2.5 MG/3ML) 0.083% nebulizer solution Commonly known as:  PROVENTIL Take 3 mLs (2.5 mg total) by nebulization every 6 (six) hours as needed for wheezing or shortness of breath.   albuterol 108 (90 Base) MCG/ACT inhaler Commonly known as:  PROVENTIL HFA;VENTOLIN HFA Inhale 1-2 puffs into the lungs every 4 (four) hours as needed for shortness of breath.   atorvastatin 40 MG tablet Commonly known as:  LIPITOR Take 40 mg by mouth daily.   carvedilol 12.5 MG tablet Commonly known as:  COREG Take 12.5 mg by mouth 2 (two) times daily with a meal.   fluticasone 44 MCG/ACT inhaler Commonly known as:  FLOVENT HFA Inhale 2 puffs into the lungs 2 (two) times daily.   fluticasone 50 MCG/ACT nasal spray Commonly known as:  FLONASE Place 1 spray into both nostrils daily.   guaiFENesin 600 MG 12 hr tablet Commonly known as:  MUCINEX Take 1 tablet (600 mg total) by mouth 2 (two) times daily.   montelukast 10 MG tablet Commonly known as:  SINGULAIR Take 1 tablet (10 mg total) by mouth at bedtime.   predniSONE 5 MG tablet Commonly known as:  DELTASONE Alternate 1 pill with 1/2 pill every other day

## 2017-10-20 ENCOUNTER — Telehealth: Payer: Self-pay | Admitting: Pulmonary Disease

## 2017-10-20 NOTE — Telephone Encounter (Signed)
Spoke with pt, she states her insurance company stated that they couldn't do anything about the cost of the medication. She wanted Kelli to know that she will be bringing some forms by that she gave her to fill out to help with her medications. FYI to lo [facility] to look out for the paperwork. Darlene Stafford

## 2017-10-23 NOTE — Telephone Encounter (Signed)
Spoke with patient, patient states she had a stroke and cannot write well so she has to wait until her daughter can help her with the paperwork. Patient states she does not know when her daughter will be able to help her. Informed patient that once she dropped her paper work off we would assist her then. Nothing further needed at this time.

## 2017-10-28 ENCOUNTER — Telehealth: Payer: Self-pay | Admitting: Pulmonary Disease

## 2017-10-28 NOTE — Telephone Encounter (Signed)
rec'd pt assistance forms completed and signed by patient today Placed envelope from patient to VS look at folder. Paperwork needs to be signed by VS VS is out of the office until 11/09/2017

## 2017-11-10 NOTE — Telephone Encounter (Signed)
Kelli - please advise if this paperwork has been completed. Thanks.

## 2017-11-12 NOTE — Telephone Encounter (Signed)
VS has the forms with him at this time, once completed will update pt.  VS please advise when forms are signed and completed. Thank you.

## 2017-11-17 NOTE — Telephone Encounter (Signed)
Kelli, please advise if you have a status on the paperwork. Thanks!

## 2017-11-17 NOTE — Telephone Encounter (Addendum)
Paperwork still on Dr. Juanetta Gosling desk waiting for review and signature.

## 2017-11-18 NOTE — Telephone Encounter (Signed)
Dr. Halford Chessman is back in the office on 11/23/17. Forms will be addressed then.

## 2017-11-19 NOTE — Telephone Encounter (Signed)
Dr Halford Chessman signed the [place] forms Looks like the pt is still needing to sign before we can send I spoke with the pt and notified she needs to sign page 4  She verbalized understanding  Forms placed back up front with note to return to nurse once signed so we can fax

## 2017-11-19 NOTE — Telephone Encounter (Signed)
Form signed.

## 2017-11-19 NOTE — Telephone Encounter (Signed)
Dagmar Hait- did he give this to you? Thanks

## 2017-11-23 NOTE — Telephone Encounter (Signed)
Pt is calling back [phone]

## 2017-11-23 NOTE — Telephone Encounter (Signed)
Forms have not been signed yet as they are still up front. I have left a message for pt to return our call.

## 2017-11-23 NOTE — Telephone Encounter (Signed)
Spoke with patient. She stated that she had a family emergency and has not been able to get to the office in order to sign the forms. She stated that she will come by sometime this week once things have settled down for her. Advised her that the forms will remain up front, she verbalized understanding.   Nothing further needed at time of call.

## 2018-01-10 ENCOUNTER — Other Ambulatory Visit: Payer: Self-pay | Admitting: Pulmonary Disease

## 2018-01-13 ENCOUNTER — Telehealth: Payer: Self-pay | Admitting: Pulmonary Disease

## 2018-01-13 MED ORDER — ALBUTEROL SULFATE HFA 108 (90 BASE) MCG/ACT IN AERS: 1.0000 | INHALATION_SPRAY | RESPIRATORY_TRACT | 0 refills | Status: DC | PRN

## 2018-01-13 NOTE — Telephone Encounter (Signed)
Called and spoke with patient, refill has been sent. Nothing further needed.

## 2018-03-01 ENCOUNTER — Ambulatory Visit: Payer: Medicare Other | Admitting: Pulmonary Disease

## 2018-03-05 ENCOUNTER — Other Ambulatory Visit: Payer: Self-pay

## 2018-03-05 ENCOUNTER — Emergency Department (HOSPITAL_COMMUNITY)
Admission: EM | Admit: 2018-03-05 | Discharge: 2018-03-06 | Disposition: A | Discharge status: Home / Self Care | Payer: Medicare Other | Attending: Emergency Medicine | Admitting: Emergency Medicine

## 2018-03-05 ENCOUNTER — Emergency Department (HOSPITAL_COMMUNITY): Payer: Medicare Other

## 2018-03-05 DIAGNOSIS — E1122 Type 2 diabetes mellitus with diabetic chronic kidney disease: Secondary | ICD-10-CM | POA: Insufficient documentation

## 2018-03-05 DIAGNOSIS — J45909 Unspecified asthma, uncomplicated: Secondary | ICD-10-CM | POA: Insufficient documentation

## 2018-03-05 DIAGNOSIS — N183 Chronic kidney disease, stage 3 (moderate): Secondary | ICD-10-CM | POA: Diagnosis not present

## 2018-03-05 DIAGNOSIS — M79672 Pain in left foot: Secondary | ICD-10-CM | POA: Diagnosis present

## 2018-03-05 DIAGNOSIS — N179 Acute kidney failure, unspecified: Secondary | ICD-10-CM

## 2018-03-05 DIAGNOSIS — M109 Gout, unspecified: Secondary | ICD-10-CM | POA: Insufficient documentation

## 2018-03-05 DIAGNOSIS — D869 Sarcoidosis, unspecified: Secondary | ICD-10-CM | POA: Diagnosis not present

## 2018-03-05 DIAGNOSIS — Z79899 Other long term (current) drug therapy: Secondary | ICD-10-CM | POA: Diagnosis not present

## 2018-03-05 LAB — COMPREHENSIVE METABOLIC PANEL
ALBUMIN: 3.5 g/dL (ref 3.5–5.0)
ALT: 9 U/L (ref 0–44)
AST: 24 U/L (ref 15–41)
Alkaline Phosphatase: 65 U/L (ref 38–126)
Anion gap: 17 — ABNORMAL HIGH (ref 5–15)
BUN: 13 mg/dL (ref 8–23)
CO2: 24 mmol/L (ref 22–32)
Calcium: 8.3 mg/dL — ABNORMAL LOW (ref 8.9–10.3)
Chloride: 93 mmol/L — ABNORMAL LOW (ref 98–111)
Creatinine, Ser: 1.61 mg/dL — ABNORMAL HIGH (ref 0.44–1.00)
GFR calc Af Amer: 38 mL/min — ABNORMAL LOW (ref >60)
GFR calc non Af Amer: 33 mL/min — ABNORMAL LOW (ref >60)
Glucose, Bld: 124 mg/dL — ABNORMAL HIGH (ref 70–99)
Potassium: 3 mmol/L — ABNORMAL LOW (ref 3.5–5.1)
Sodium: 134 mmol/L — ABNORMAL LOW (ref 135–145)
Total Bilirubin: 2.5 mg/dL — ABNORMAL HIGH (ref 0.3–1.2)
Total Protein: 7.5 g/dL (ref 6.5–8.1)

## 2018-03-05 LAB — CBC WITH DIFFERENTIAL/PLATELET
Abs Immature Granulocytes: 0.24 10*3/uL — ABNORMAL HIGH (ref 0.00–0.07)
Basophils Absolute: 0.1 10*3/uL (ref 0.0–0.1)
Basophils Relative: 1 %
Eosinophils Absolute: 0.3 10*3/uL (ref 0.0–0.5)
Eosinophils Relative: 2 %
HCT: 38.5 % (ref 36.0–46.0)
Hemoglobin: 12.5 g/dL (ref 12.0–15.0)
Immature Granulocytes: 2 %
Lymphocytes Relative: 22 %
Lymphs Abs: 2.7 10*3/uL (ref 0.7–4.0)
MCH: 29.3 pg (ref 26.0–34.0)
MCHC: 32.5 g/dL (ref 30.0–36.0)
MCV: 90.2 fL (ref 80.0–100.0)
Monocytes Absolute: 1.4 10*3/uL — ABNORMAL HIGH (ref 0.1–1.0)
Monocytes Relative: 11 %
Neutro Abs: 8 10*3/uL — ABNORMAL HIGH (ref 1.7–7.7)
Neutrophils Relative %: 62 %
Platelets: 502 10*3/uL — ABNORMAL HIGH (ref 150–400)
RBC: 4.27 MIL/uL (ref 3.87–5.11)
RDW: 12.7 % (ref 11.5–15.5)
WBC: 12.7 10*3/uL — ABNORMAL HIGH (ref 4.0–10.5)
nRBC: 0.2 % (ref 0.0–0.2)

## 2018-03-05 LAB — URINALYSIS, ROUTINE W REFLEX MICROSCOPIC
Bilirubin Urine: NEGATIVE
Glucose, UA: NEGATIVE mg/dL
Hgb urine dipstick: NEGATIVE
Ketones, ur: NEGATIVE mg/dL
Leukocytes, UA: NEGATIVE
Nitrite: NEGATIVE
Protein, ur: NEGATIVE mg/dL
SPECIFIC GRAVITY, URINE: 1.013 (ref 1.005–1.030)
pH: 5 (ref 5.0–8.0)

## 2018-03-05 LAB — INFLUENZA PANEL BY PCR (TYPE A & B)
Influenza A By PCR: NEGATIVE
Influenza B By PCR: NEGATIVE

## 2018-03-05 LAB — LACTIC ACID, PLASMA: Lactic Acid, Venous: 2 mmol/L (ref 0.5–1.9)

## 2018-03-05 MED ORDER — ALBUTEROL (5 MG/ML) CONTINUOUS INHALATION SOLN
10.0000 mg/h | INHALATION_SOLUTION | Freq: Once | RESPIRATORY_TRACT | Status: AC
  Administered 2018-03-05: 10 mg/h via RESPIRATORY_TRACT
  Filled 2018-03-05: qty 20

## 2018-03-05 MED ORDER — OXYCODONE-ACETAMINOPHEN 5-325 MG PO TABS
1.0000 | ORAL_TABLET | Freq: Once | ORAL | Status: AC
  Administered 2018-03-05: 1 via ORAL
  Filled 2018-03-05: qty 1

## 2018-03-05 MED ORDER — SODIUM CHLORIDE 0.9 % IV BOLUS
500.0000 mL | Freq: Once | INTRAVENOUS | Status: AC
  Administered 2018-03-05: 500 mL via INTRAVENOUS

## 2018-03-05 MED ORDER — PREDNISONE 10 MG PO TABS: 50.0000 mg | ORAL_TABLET | Freq: Every day | ORAL | 0 refills | Status: DC

## 2018-03-05 MED ORDER — HYDROCODONE-ACETAMINOPHEN 5-325 MG PO TABS: 1.0000 | ORAL_TABLET | Freq: Three times a day (TID) | ORAL | 0 refills | Status: AC | PRN

## 2018-03-05 MED ORDER — POTASSIUM CHLORIDE CRYS ER 20 MEQ PO TBCR
40.0000 meq | EXTENDED_RELEASE_TABLET | Freq: Once | ORAL | Status: AC
  Administered 2018-03-05: 40 meq via ORAL
  Filled 2018-03-05: qty 2

## 2018-03-05 NOTE — ED Notes (Signed)
Bed: WA09 Expected date:  Expected time:  Means of arrival:  Comments: 68 yo SOB,gout

## 2018-03-05 NOTE — ED Notes (Signed)
Patient transported to X-ray 

## 2018-03-05 NOTE — ED Triage Notes (Addendum)
Per EMS, pt from home, reports L foot pain, hx of gout.  She also endorses SOB today, with expiratory wheezing in the L lung.  Hx of sarcoidosis.  She received albuterol 5mg  neb tx, feels a little better.  She is A&Ox 4.  90% on RA on scene.

## 2018-03-05 NOTE — Discharge Instructions (Signed)
We saw in the ER for wheezing and foot pain.  The foot pain is because of gout.  Please take the medications prescribed.  Additionally the wheezing is likely because of your sarcoidosis.  Take the medications prescribed for sarcoidosis flareup.  We also noted that your kidney enzyme is slightly worse than usual.  It is important that you follow-up with your primary care doctor in 1 week for repeat evaluation.  We have requested home health services to contact you tomorrow for further help with your home health needs.  Please return to the ER if your symptoms worsen; you have increased pain, fevers, chills, inability to keep any medications down, confusion. Otherwise see the outpatient doctor as requested.

## 2018-03-05 NOTE — ED Provider Notes (Signed)
[facility] DEPT Provider Note   CSN: [id number] Arrival date & time: 03/05/18  1916     History   Chief Complaint Chief Complaint  Patient presents with  . Foot Pain  . Shortness of Breath    HPI Darlene Stafford is a 68 y.o. female.  HPI 68 year old female comes in with chief complaint of foot pain and shortness of breath.  Patient has history of sarcoidosis, diabetes, stroke and gout and asthma.  Patient states that she started having left foot pain 2 days ago.  The pain is similar to her gout.  She has had gout in the similar area.  She does admit to heavy red meat usage.  Patient also has been having increased wheezing.  Family reports that patient has been assessed by the PCP, and they are closely monitoring her oxygen requirement.  She received albuterol prior to ED arrival and feeling a little better.  Review of system is negative for any fevers or sick contacts.  Patient states that she was admitted to the hospital last year with similar symptoms and found to have a pneumonia.  Past Medical History:  Diagnosis Date  . Asthma   . CVA (cerebral vascular accident) ([facility])   . Diabetes mellitus without complication ([place])   . Sarcoidosis of other sites    Ocular    Patient Active Problem List   Diagnosis Date Noted  . Acute bronchitis 04/30/2017  . Hemoptysis 04/30/2017  . Sarcoidosis 02/03/2017  . CKD (chronic kidney disease), stage III ([place]) 02/03/2017  . Morbid obesity due to excess calories ([place])   . CAP (community acquired pneumonia) 04/10/2015  . Diabetes mellitus without complication ([place]) 76/73/4193  . Asthma 04/10/2015  . Acute encephalopathy 04/10/2015  . UTI (lower urinary tract infection) 04/10/2015  . AKI (acute kidney injury) ([place]) 04/10/2015  . Sepsis ([place]) 04/10/2015  . Hypotension 04/10/2015    Past Surgical History:  Procedure Laterality Date  . PARTIAL HYSTERECTOMY  1998  . REPLACEMENT TOTAL KNEE  2015     OB  History   No obstetric history on file.      Home Medications    Prior to Admission medications   Medication Sig Start Date End Date Taking? Authorizing Provider  albuterol (PROVENTIL HFA;VENTOLIN HFA) 108 (90 Base) MCG/ACT inhaler Inhale 1-2 puffs into the lungs every 4 (four) hours as needed for shortness of breath. 01/13/18  Yes Chesley Mires, MD  albuterol (PROVENTIL) (2.5 MG/3ML) 0.083% nebulizer solution Take 3 mLs (2.5 mg total) by nebulization every 6 (six) hours as needed for wheezing or shortness of breath. 04/13/17  Yes Chesley Mires, MD  atorvastatin (LIPITOR) 40 MG tablet Take 40 mg by mouth daily.   Yes [provider]  carvedilol (COREG) 12.5 MG tablet Take 12.5 mg by mouth 2 (two) times daily with a meal.   Yes [provider]  dextromethorphan-guaiFENesin (MUCINEX DM) 30-600 MG 12hr tablet Take 1 tablet by mouth 2 (two) times daily as needed (sinus infection).   Yes [provider]  fluticasone (FLOVENT HFA) 44 MCG/ACT inhaler Inhale 2 puffs into the lungs 2 (two) times daily. 02/05/17  Yes Rosita Fire, MD  montelukast (SINGULAIR) 10 MG tablet Take 1 tablet (10 mg total) by mouth at bedtime. 08/22/16  Yes Chesley Mires, MD  omeprazole (PRILOSEC) 20 MG capsule Take 20 mg by mouth daily. 02/25/18  Yes [provider]  predniSONE (DELTASONE) 5 MG tablet Alternate 1 pill with 1/2 pill every other day  10/19/17  Yes Chesley Mires, MD  fluticasone (FLONASE) 50 MCG/ACT nasal spray Place 1 spray into both nostrils daily. Patient not taking: Reported on 03/05/2018 08/25/16   Chesley Mires, MD  guaiFENesin (MUCINEX) 600 MG 12 hr tablet Take 1 tablet (600 mg total) by mouth 2 (two) times daily. Patient not taking: Reported on 03/05/2018 04/14/15   Barton Dubois, MD    Family History Family History  Problem Relation Age of Onset  . Sarcoidosis Sister   . Breast cancer Sister   . Sarcoidosis Sister   . Sarcoidosis Sister     Social History Social  History   Tobacco Use  . Smoking status: Never Smoker  . Smokeless tobacco: Never Used  Substance Use Topics  . Alcohol use: No  . Drug use: No     Allergies   Fish allergy; Shellfish allergy; and Sulfa antibiotics   Review of Systems Review of Systems  Constitutional: Positive for activity change.  Respiratory: Positive for shortness of breath and wheezing.   Cardiovascular: Negative for chest pain.  Musculoskeletal: Positive for arthralgias and myalgias.  Allergic/Immunologic: Positive for immunocompromised state.  All other systems reviewed and are negative.    Physical Exam Updated Vital Signs BP 109/60   Pulse 97   Temp 99.2 F (37.3 C) (Oral)   Resp (!) 27   Ht 5\' 3"  (1.6 m)   Wt 113.4 kg   SpO2 100%   BMI 44.29 kg/m   Physical Exam Vitals signs and nursing note reviewed.  Constitutional:      Appearance: She is well-developed.  HENT:     Head: Normocephalic and atraumatic.  Neck:     Musculoskeletal: Normal range of motion and neck supple.  Cardiovascular:     Rate and Rhythm: Normal rate.  Pulmonary:     Effort: Pulmonary effort is normal.     Breath sounds: Examination of the right-upper field reveals wheezing. Examination of the left-upper field reveals wheezing. Examination of the right-middle field reveals wheezing. Examination of the left-middle field reveals wheezing. Examination of the right-lower field reveals wheezing and rales. Examination of the left-lower field reveals wheezing and rales. Wheezing and rales present.  Abdominal:     General: Bowel sounds are normal.  Musculoskeletal:     Comments: Patient has tenderness over the left foot where there is gross edema.  The tenderness is over the MTP joint.  There is also warm to touch.  Patient is able to move the ankle and there is no tenderness to palpation of the ankle.  Skin:    General: Skin is warm and dry.  Neurological:     Mental Status: She is alert and oriented to person, place,  and time.      ED Treatments / Results  Labs (all labs ordered are listed, but only abnormal results are displayed) Labs Reviewed  LACTIC ACID, PLASMA - Abnormal; Notable for the following components:      Result Value   Lactic Acid, Venous 2.0 (*)    All other components within normal limits  COMPREHENSIVE METABOLIC PANEL - Abnormal; Notable for the following components:   Sodium 134 (*)    Potassium 3.0 (*)    Chloride 93 (*)    Glucose, Bld 124 (*)    Creatinine, Ser 1.61 (*)    Calcium 8.3 (*)    Total Bilirubin 2.5 (*)    GFR calc non Af Amer 33 (*)    GFR calc Af Amer 38 (*)  Anion gap 17 (*)    All other components within normal limits  CBC WITH DIFFERENTIAL/PLATELET - Abnormal; Notable for the following components:   WBC 12.7 (*)    Platelets 502 (*)    Neutro Abs 8.0 (*)    Monocytes Absolute 1.4 (*)    Abs Immature Granulocytes 0.24 (*)    All other components within normal limits  URINE CULTURE  URINALYSIS, ROUTINE W REFLEX MICROSCOPIC  INFLUENZA PANEL BY PCR (TYPE A & B)    EKG EKG Interpretation  Date/Time:  Friday March 05 2018 19:57:11 EST Ventricular Rate:  101 PR Interval:    QRS Duration: 132 QT Interval:  400 QTC Calculation: 519 R Axis:   -119 Text Interpretation:  Sinus tachycardia Atrial premature complex RBBB and LAFB No acute changes No significant change since last tracing Confirmed by Varney Biles [phone]) on 03/05/2018 9:21:03 PM   Radiology Dg Chest 2 View  Result Date: 03/05/2018 CLINICAL DATA:  Per EMS, pt from home, reports L foot pain, hx of gout. She also endorses SOB today, with expiratory wheezing in the L lung. Hx of sarcoidosis PT HX: DM, asthma, non smoker EXAM: CHEST - 2 VIEW COMPARISON:  04/30/2017 and older exams. FINDINGS: Extensive right lung fibrosis with volume loss. Milder fibrosis at the left lung base. These findings are stable. No acute changes in the lungs. No pleural effusion or pneumothorax. Cardiac  silhouette is normal in size. No mediastinal or hilar masses. No convincing adenopathy. Skeletal structures are intact. IMPRESSION: 1. No acute cardiopulmonary disease. 2. Stable pulmonary fibrosis, most evident in the right lung. Electronically Signed   By: Lajean Manes M.D.   On: 03/05/2018 20:39    Procedures .Critical Care Performed by: Varney Biles, MD Authorized by: Varney Biles, MD   Critical care provider statement:    Critical care time (minutes):  32   Critical care was necessary to treat or prevent imminent or life-threatening deterioration of the following conditions:  Respiratory failure   Critical care was time spent personally by me on the following activities:  Discussions with consultants, evaluation of patient's response to treatment, examination of patient, ordering and performing treatments and interventions, ordering and review of laboratory studies, ordering and review of radiographic studies, pulse oximetry, re-evaluation of patient's condition, obtaining history from patient or surrogate and review of old charts   (including critical care time)  Medications Ordered in ED Medications  sodium chloride 0.9 % bolus 500 mL (500 mLs Intravenous New Bag/Given 03/05/18 2256)  albuterol (PROVENTIL,VENTOLIN) solution continuous neb (10 mg/hr Nebulization Given 03/05/18 2125)  potassium chloride SA (K-DUR,KLOR-CON) CR tablet 40 mEq (40 mEq Oral Given 03/05/18 2253)  oxyCODONE-acetaminophen (PERCOCET/ROXICET) 5-325 MG per tablet 1 tablet (1 tablet Oral Given 03/05/18 2253)     Initial Impression / Assessment and Plan / ED Course  I have reviewed the triage vital signs and the nursing notes.  Pertinent labs & imaging results that were available during my care of the patient were reviewed by me and considered in my medical decision making (see chart for details).  Clinical Course as of Mar 05 2328  Fri Mar 05, 2018  2330 Patient reassessed. She is feeling better as she is  getting the nebulizer treatment. She has slight elevation of creatinine and bilirubin. White count is mildly elevated at 12.7.  X-ray does not have any clear findings of pneumonia.  Flu pending.  Patient aware of the disposition and she is in agreement with the plan   [  AN]    Clinical Course User Index [AN] Varney Biles, MD    68 year old comes in a chief complaint of foot pain and wheezing.  She has history of gout and asthma.  On exam patient has wheezing that is generalized along with some rales over the lower lung fields.  Clinical concerns are high for sarcoidosis flareup.  There is no fevers, however patient has had pneumonia with similar presentation in the past therefore pneumonia is also in the differential.  Finally influenza panel also sent based on the clinical findings.  As far as the foot is concerned, patient clearly has an acute gout flareup.  She has CKD, therefore we will give her narcotic pain medication for now.  She will also get prednisone for symptom control.  We will sign out the care to incoming team.  Influenza PCR results are pending.  After discussing the results with the family, they are comfortable going home.  We did discuss the possibility of home health, and family is well coming home health assistance.  We will also request respiratory to see the patient because of her worsening respiratory status.  Strict ER return precautions have been discussed, and patient is agreeing with the plan and is comfortable with the workup done and the recommendations from the ER.   Final Clinical Impressions(s) / ED Diagnoses   Final diagnoses:  Acute gout involving toe of left foot, unspecified cause  Sarcoidosis    ED Discharge Orders    None       Varney Biles, MD 03/05/18 [phone]

## 2018-03-07 LAB — URINE CULTURE: Culture: NO GROWTH

## 2018-03-08 ENCOUNTER — Ambulatory Visit: Payer: Medicare Other | Admitting: Pulmonary Disease

## 2018-03-15 ENCOUNTER — Ambulatory Visit: Payer: Medicare Other | Admitting: Pulmonary Disease

## 2018-03-17 ENCOUNTER — Encounter: Payer: Self-pay | Admitting: Pulmonary Disease

## 2018-03-17 ENCOUNTER — Ambulatory Visit (INDEPENDENT_AMBULATORY_CARE_PROVIDER_SITE_OTHER): Payer: Medicare Other | Admitting: Pulmonary Disease

## 2018-03-17 VITALS — BP 108/70 | HR 86 | Ht 61.0 in | Wt 250.0 lb

## 2018-03-17 DIAGNOSIS — J4550 Severe persistent asthma, uncomplicated: Secondary | ICD-10-CM | POA: Diagnosis not present

## 2018-03-17 DIAGNOSIS — Z6841 Body Mass Index (BMI) 40.0 and over, adult: Secondary | ICD-10-CM | POA: Diagnosis not present

## 2018-03-17 DIAGNOSIS — D869 Sarcoidosis, unspecified: Secondary | ICD-10-CM | POA: Diagnosis not present

## 2018-03-17 MED ORDER — PREDNISONE 2.5 MG PO TABS: 2.5000 mg | ORAL_TABLET | Freq: Every day | ORAL | 5 refills | Status: DC

## 2018-03-17 NOTE — Patient Instructions (Signed)
Prednisone 2.5 mg daily  Follow up in 4 months

## 2018-03-17 NOTE — Progress Notes (Signed)
[facility]  Chief Complaint  Patient presents with  . Follow-up    pt states no new or worsening symptoms with her breathing; denies chest tightness/pain    Constitutional:  BP 108/70 (BP Location: Left Arm, Cuff Size: Normal)   Pulse 86   Ht 5\' 1"  (1.549 m)   Wt 250 lb (113.4 kg)   SpO2 99%   BMI 47.24 kg/m   Past Medical History:  DM, CVA  Brief Summary:  Darlene Stafford is a 68 y.o. female with sarcoidosis on chronic prednisone, allergic asthma, and upper airway cough syndrome.  Breathing has been okay.  Not having much cough or wheeze.  Not having sputum, sinus congestion, sore throat, or chest pain.  Has been getting episodes of diarrhea.    Physical Exam:   Appearance - well kempt   ENMT - clear nasal mucosa, midline nasal  septum, no oral exudates, no LAN, trachea midline  Respiratory - normal chest wall, normal respiratory effort, no accessory muscle use, no wheeze/rales  CV - s1s2 regular rate and rhythm, no murmurs, no peripheral edema, radial pulses symmetric  GI - soft, non tender, no masses  Lymph - no adenopathy noted in neck and axillary areas  MSK - in wheel chair  Ext - no cyanosis, clubbing, or joint inflammation noted  Skin - no rashes, lesions, or ulcers  Neuro - normal strength, oriented x 3  Psych - normal mood and affect  Assessment/Plan:   Systemic sarcoidosis. - change prednisone to 2.5 mg daily and continue until next follow up - goal is to hopefully wean her off prednisone completely - f/u with ophthalmology  Allergic asthma with elevated IgE. - continue flovent, singulair, prednisone - prn albuterol  Upper airway cough syndrome with post nasal drip. - flonase, singulair, OTC antihistamine  Recent pneumonia. - monitor clinically - usually happens in Winter  Obesity. - discussed options to assist with weight loss, including diet modification and exercise   Patient Instructions    Prednisone 2.5 mg daily  Follow up in 4 months    Chesley Mires, MD [facility] Pager: [phone] 03/17/2018, 4:03 PM  Flow Sheet     Pulmonary tests:  RAST 09/30/16 >> multiple allergens, IgE 3120 HRCT chest 11/03/16 >> patchy septal thickening, honeycombing, and bronchiectasis PFT 11/10/16 >> FEV1 1.13 (73%), FEV1% 92, TLC 3.12 (71%), DLCO 76%, no BD  Medications:   Allergies as of 03/17/2018      Reactions   Fish Allergy Anaphylaxis   Shellfish Allergy Anaphylaxis   Sulfa Antibiotics Hives, Itching      Medication List       Accurate as of March 17, 2018  4:03 PM. Always use your most recent med list.        albuterol (2.5 MG/3ML) 0.083% nebulizer solution Commonly known as:  PROVENTIL Take 3 mLs (2.5 mg total) by nebulization every 6 (six) hours as needed for wheezing or shortness of breath.   albuterol 108 (90 Base) MCG/ACT inhaler Commonly known as:  PROVENTIL HFA;VENTOLIN HFA Inhale 1-2 puffs into the lungs every 4 (four) hours as needed for shortness of breath.   atorvastatin 40 MG tablet Commonly known as:  LIPITOR Take 40 mg by mouth daily.   carvedilol 12.5 MG tablet Commonly known as:  COREG Take 12.5 mg by mouth 2 (two) times daily with a meal.   dextromethorphan-guaiFENesin 30-600 MG 12hr tablet Commonly known as:  MUCINEX DM Take 1 tablet by mouth 2 (  two) times daily as needed (sinus infection).   fluticasone 44 MCG/ACT inhaler Commonly known as:  FLOVENT HFA Inhale 2 puffs into the lungs 2 (two) times daily.   fluticasone 50 MCG/ACT nasal spray Commonly known as:  FLONASE Place 1 spray into both nostrils daily.   guaiFENesin 600 MG 12 hr tablet Commonly known as:  MUCINEX Take 1 tablet (600 mg total) by mouth 2 (two) times daily.   montelukast 10 MG tablet Commonly known as:  SINGULAIR Take 1 tablet (10 mg total) by mouth at bedtime.   omeprazole 20 MG capsule Commonly known as:  PRILOSEC Take 20 mg by  mouth daily.   predniSONE 2.5 MG tablet Commonly known as:  DELTASONE Take 1 tablet (2.5 mg total) by mouth daily with breakfast.       Past Surgical History:  She  has a past surgical history that includes Replacement total knee (2015) and Partial hysterectomy (1998).  Family History:  Her family history includes Breast cancer in her sister; Sarcoidosis in her sister, sister, and sister.  Social History:  She  reports that she has never smoked. She has never used smokeless tobacco. She reports that she does not drink alcohol or use drugs.

## 2018-03-19 ENCOUNTER — Emergency Department (HOSPITAL_COMMUNITY): Payer: Medicare Other

## 2018-03-19 ENCOUNTER — Encounter (HOSPITAL_COMMUNITY): Payer: Self-pay

## 2018-03-19 ENCOUNTER — Inpatient Hospital Stay (HOSPITAL_COMMUNITY)
Admission: EM | Admit: 2018-03-19 | Discharge: 2018-03-22 | DRG: 872 | Disposition: A | Discharge status: Home / Self Care | Payer: Medicare Other | Attending: Internal Medicine | Admitting: Internal Medicine

## 2018-03-19 DIAGNOSIS — Z882 Allergy status to sulfonamides status: Secondary | ICD-10-CM | POA: Diagnosis not present

## 2018-03-19 DIAGNOSIS — N184 Chronic kidney disease, stage 4 (severe): Secondary | ICD-10-CM | POA: Diagnosis present

## 2018-03-19 DIAGNOSIS — W1830XA Fall on same level, unspecified, initial encounter: Secondary | ICD-10-CM | POA: Diagnosis present

## 2018-03-19 DIAGNOSIS — A4151 Sepsis due to Escherichia coli [E. coli]: Secondary | ICD-10-CM | POA: Diagnosis not present

## 2018-03-19 DIAGNOSIS — J45909 Unspecified asthma, uncomplicated: Secondary | ICD-10-CM | POA: Diagnosis present

## 2018-03-19 DIAGNOSIS — Z7952 Long term (current) use of systemic steroids: Secondary | ICD-10-CM | POA: Diagnosis not present

## 2018-03-19 DIAGNOSIS — N179 Acute kidney failure, unspecified: Secondary | ICD-10-CM | POA: Diagnosis present

## 2018-03-19 DIAGNOSIS — Z803 Family history of malignant neoplasm of breast: Secondary | ICD-10-CM

## 2018-03-19 DIAGNOSIS — Z6841 Body Mass Index (BMI) 40.0 and over, adult: Secondary | ICD-10-CM

## 2018-03-19 DIAGNOSIS — R509 Fever, unspecified: Secondary | ICD-10-CM

## 2018-03-19 DIAGNOSIS — A419 Sepsis, unspecified organism: Secondary | ICD-10-CM | POA: Diagnosis not present

## 2018-03-19 DIAGNOSIS — Z90711 Acquired absence of uterus with remaining cervical stump: Secondary | ICD-10-CM

## 2018-03-19 DIAGNOSIS — M109 Gout, unspecified: Secondary | ICD-10-CM | POA: Diagnosis present

## 2018-03-19 DIAGNOSIS — Z96659 Presence of unspecified artificial knee joint: Secondary | ICD-10-CM | POA: Diagnosis present

## 2018-03-19 DIAGNOSIS — Z91013 Allergy to seafood: Secondary | ICD-10-CM | POA: Diagnosis not present

## 2018-03-19 DIAGNOSIS — R6520 Severe sepsis without septic shock: Secondary | ICD-10-CM | POA: Diagnosis present

## 2018-03-19 DIAGNOSIS — N183 Chronic kidney disease, stage 3 unspecified: Secondary | ICD-10-CM | POA: Diagnosis present

## 2018-03-19 DIAGNOSIS — Z7951 Long term (current) use of inhaled steroids: Secondary | ICD-10-CM

## 2018-03-19 DIAGNOSIS — Z79899 Other long term (current) drug therapy: Secondary | ICD-10-CM

## 2018-03-19 DIAGNOSIS — Z8673 Personal history of transient ischemic attack (TIA), and cerebral infarction without residual deficits: Secondary | ICD-10-CM

## 2018-03-19 DIAGNOSIS — R0609 Other forms of dyspnea: Secondary | ICD-10-CM | POA: Diagnosis not present

## 2018-03-19 DIAGNOSIS — D869 Sarcoidosis, unspecified: Secondary | ICD-10-CM | POA: Diagnosis present

## 2018-03-19 DIAGNOSIS — E1122 Type 2 diabetes mellitus with diabetic chronic kidney disease: Secondary | ICD-10-CM | POA: Diagnosis present

## 2018-03-19 DIAGNOSIS — N390 Urinary tract infection, site not specified: Secondary | ICD-10-CM | POA: Diagnosis present

## 2018-03-19 DIAGNOSIS — J45901 Unspecified asthma with (acute) exacerbation: Secondary | ICD-10-CM | POA: Diagnosis not present

## 2018-03-19 LAB — CBC WITH DIFFERENTIAL/PLATELET
ABS IMMATURE GRANULOCYTES: 0.18 10*3/uL — AB (ref 0.00–0.07)
Basophils Absolute: 0.1 10*3/uL (ref 0.0–0.1)
Basophils Relative: 1 %
Eosinophils Absolute: 0.3 10*3/uL (ref 0.0–0.5)
Eosinophils Relative: 2 %
HCT: 34.5 % — ABNORMAL LOW (ref 36.0–46.0)
Hemoglobin: 11.1 g/dL — ABNORMAL LOW (ref 12.0–15.0)
Immature Granulocytes: 2 %
Lymphocytes Relative: 17 %
Lymphs Abs: 2 10*3/uL (ref 0.7–4.0)
MCH: 28.3 pg (ref 26.0–34.0)
MCHC: 32.2 g/dL (ref 30.0–36.0)
MCV: 88 fL (ref 80.0–100.0)
Monocytes Absolute: 1.6 10*3/uL — ABNORMAL HIGH (ref 0.1–1.0)
Monocytes Relative: 14 %
NEUTROS ABS: 7.3 10*3/uL (ref 1.7–7.7)
NEUTROS PCT: 64 %
Platelets: 405 10*3/uL — ABNORMAL HIGH (ref 150–400)
RBC: 3.92 MIL/uL (ref 3.87–5.11)
RDW: 12.5 % (ref 11.5–15.5)
WBC: 11.4 10*3/uL — ABNORMAL HIGH (ref 4.0–10.5)
nRBC: 0 % (ref 0.0–0.2)

## 2018-03-19 LAB — URINALYSIS, ROUTINE W REFLEX MICROSCOPIC
Bilirubin Urine: NEGATIVE
Glucose, UA: NEGATIVE mg/dL
Ketones, ur: 5 mg/dL — AB
Nitrite: NEGATIVE
PH: 5 (ref 5.0–8.0)
Protein, ur: 30 mg/dL — AB
SPECIFIC GRAVITY, URINE: 1.013 (ref 1.005–1.030)
WBC, UA: >50 WBC/hpf — ABNORMAL HIGH (ref 0–5)

## 2018-03-19 LAB — COMPREHENSIVE METABOLIC PANEL
ALBUMIN: 3 g/dL — AB (ref 3.5–5.0)
ALT: 13 U/L (ref 0–44)
AST: 29 U/L (ref 15–41)
Alkaline Phosphatase: 81 U/L (ref 38–126)
Anion gap: 16 — ABNORMAL HIGH (ref 5–15)
BUN: 13 mg/dL (ref 8–23)
CO2: 26 mmol/L (ref 22–32)
Calcium: 8.3 mg/dL — ABNORMAL LOW (ref 8.9–10.3)
Chloride: 90 mmol/L — ABNORMAL LOW (ref 98–111)
Creatinine, Ser: 1.94 mg/dL — ABNORMAL HIGH (ref 0.44–1.00)
GFR calc Af Amer: 30 mL/min — ABNORMAL LOW (ref >60)
GFR calc non Af Amer: 26 mL/min — ABNORMAL LOW (ref >60)
Glucose, Bld: 142 mg/dL — ABNORMAL HIGH (ref 70–99)
POTASSIUM: 3.6 mmol/L (ref 3.5–5.1)
Sodium: 132 mmol/L — ABNORMAL LOW (ref 135–145)
Total Bilirubin: 4.3 mg/dL — ABNORMAL HIGH (ref 0.3–1.2)
Total Protein: 6.8 g/dL (ref 6.5–8.1)

## 2018-03-19 LAB — LACTIC ACID, PLASMA
Lactic Acid, Venous: 2.4 mmol/L (ref 0.5–1.9)
Lactic Acid, Venous: 3.1 mmol/L (ref 0.5–1.9)

## 2018-03-19 LAB — INFLUENZA PANEL BY PCR (TYPE A & B)
Influenza A By PCR: NEGATIVE
Influenza B By PCR: NEGATIVE

## 2018-03-19 MED ORDER — VANCOMYCIN HCL 10 G IV SOLR
1250.0000 mg | INTRAVENOUS | Status: DC
  Filled 2018-03-19: qty 1250

## 2018-03-19 MED ORDER — ENOXAPARIN SODIUM 40 MG/0.4ML ~~LOC~~ SOLN
40.0000 mg | SUBCUTANEOUS | Status: DC
  Administered 2018-03-19 – 2018-03-21 (×3): 40 mg via SUBCUTANEOUS
  Filled 2018-03-19 (×3): qty 0.4

## 2018-03-19 MED ORDER — SODIUM CHLORIDE 0.9 % IV BOLUS (SEPSIS)
1000.0000 mL | Freq: Once | INTRAVENOUS | Status: AC
  Administered 2018-03-19: 1000 mL via INTRAVENOUS

## 2018-03-19 MED ORDER — ATORVASTATIN CALCIUM 40 MG PO TABS
40.0000 mg | ORAL_TABLET | Freq: Every day | ORAL | Status: DC
  Administered 2018-03-19 – 2018-03-22 (×4): 40 mg via ORAL
  Filled 2018-03-19 (×4): qty 1

## 2018-03-19 MED ORDER — FLUTICASONE PROPIONATE 50 MCG/ACT NA SUSP
1.0000 | Freq: Every day | NASAL | Status: DC
  Administered 2018-03-20 – 2018-03-22 (×3): 1 via NASAL
  Filled 2018-03-19: qty 16

## 2018-03-19 MED ORDER — ALBUTEROL SULFATE (2.5 MG/3ML) 0.083% IN NEBU: 2.5000 mg | INHALATION_SOLUTION | RESPIRATORY_TRACT | Status: DC | PRN

## 2018-03-19 MED ORDER — SODIUM CHLORIDE 0.9 % IV SOLN
2.0000 g | Freq: Once | INTRAVENOUS | Status: AC
  Administered 2018-03-19: 2 g via INTRAVENOUS
  Filled 2018-03-19 (×2): qty 2

## 2018-03-19 MED ORDER — SODIUM CHLORIDE 0.9 % IV BOLUS (SEPSIS)
500.0000 mL | Freq: Once | INTRAVENOUS | Status: AC
  Administered 2018-03-19: 500 mL via INTRAVENOUS

## 2018-03-19 MED ORDER — ALBUTEROL SULFATE (2.5 MG/3ML) 0.083% IN NEBU
2.5000 mg | INHALATION_SOLUTION | RESPIRATORY_TRACT | Status: DC | PRN
  Administered 2018-03-20: 2.5 mg via RESPIRATORY_TRACT
  Filled 2018-03-19: qty 3

## 2018-03-19 MED ORDER — METHYLPREDNISOLONE SODIUM SUCC 125 MG IJ SOLR
60.0000 mg | Freq: Once | INTRAMUSCULAR | Status: AC
  Administered 2018-03-19: 60 mg via INTRAVENOUS
  Filled 2018-03-19: qty 2

## 2018-03-19 MED ORDER — VANCOMYCIN HCL 10 G IV SOLR
2000.0000 mg | Freq: Once | INTRAVENOUS | Status: AC
  Administered 2018-03-19: 2000 mg via INTRAVENOUS
  Filled 2018-03-19: qty 2000

## 2018-03-19 MED ORDER — ACETAMINOPHEN 500 MG PO TABS
1000.0000 mg | ORAL_TABLET | Freq: Once | ORAL | Status: AC
  Administered 2018-03-19: 1000 mg via ORAL
  Filled 2018-03-19: qty 2

## 2018-03-19 MED ORDER — VANCOMYCIN HCL IN DEXTROSE 1-5 GM/200ML-% IV SOLN: 1000.0000 mg | Freq: Once | INTRAVENOUS | Status: DC

## 2018-03-19 MED ORDER — BUDESONIDE 0.25 MG/2ML IN SUSP
0.2500 mg | Freq: Two times a day (BID) | RESPIRATORY_TRACT | Status: DC
  Administered 2018-03-19 – 2018-03-22 (×5): 0.25 mg via RESPIRATORY_TRACT
  Filled 2018-03-19 (×6): qty 2

## 2018-03-19 MED ORDER — SODIUM CHLORIDE 0.9 % IV SOLN
INTRAVENOUS | Status: DC
  Administered 2018-03-19 – 2018-03-20 (×2): via INTRAVENOUS

## 2018-03-19 MED ORDER — ONDANSETRON HCL 4 MG PO TABS: 4.0000 mg | ORAL_TABLET | Freq: Four times a day (QID) | ORAL | Status: DC | PRN

## 2018-03-19 MED ORDER — SODIUM CHLORIDE 0.9 % IV SOLN
2.0000 g | INTRAVENOUS | Status: DC
  Administered 2018-03-20: 2 g via INTRAVENOUS
  Filled 2018-03-19 (×2): qty 2

## 2018-03-19 MED ORDER — ARFORMOTEROL TARTRATE 15 MCG/2ML IN NEBU
15.0000 ug | INHALATION_SOLUTION | Freq: Two times a day (BID) | RESPIRATORY_TRACT | Status: DC
  Administered 2018-03-19 – 2018-03-22 (×5): 15 ug via RESPIRATORY_TRACT
  Filled 2018-03-19 (×6): qty 2

## 2018-03-19 MED ORDER — GUAIFENESIN ER 600 MG PO TB12
600.0000 mg | ORAL_TABLET | Freq: Two times a day (BID) | ORAL | Status: DC
  Administered 2018-03-19 – 2018-03-22 (×6): 600 mg via ORAL
  Filled 2018-03-19 (×6): qty 1

## 2018-03-19 MED ORDER — ACETAMINOPHEN 325 MG PO TABS
650.0000 mg | ORAL_TABLET | Freq: Four times a day (QID) | ORAL | Status: DC | PRN
  Administered 2018-03-20 (×2): 650 mg via ORAL
  Filled 2018-03-19 (×2): qty 2

## 2018-03-19 MED ORDER — ACETAMINOPHEN 650 MG RE SUPP: 650.0000 mg | Freq: Four times a day (QID) | RECTAL | Status: DC | PRN

## 2018-03-19 MED ORDER — HYDRALAZINE HCL 20 MG/ML IJ SOLN: 10.0000 mg | Freq: Four times a day (QID) | INTRAMUSCULAR | Status: DC | PRN

## 2018-03-19 MED ORDER — MONTELUKAST SODIUM 10 MG PO TABS
10.0000 mg | ORAL_TABLET | Freq: Every day | ORAL | Status: DC
  Administered 2018-03-19 – 2018-03-21 (×3): 10 mg via ORAL
  Filled 2018-03-19 (×3): qty 1

## 2018-03-19 MED ORDER — PANTOPRAZOLE SODIUM 40 MG PO TBEC
40.0000 mg | DELAYED_RELEASE_TABLET | Freq: Every day | ORAL | Status: DC
  Administered 2018-03-20 – 2018-03-22 (×3): 40 mg via ORAL
  Filled 2018-03-19 (×3): qty 1

## 2018-03-19 MED ORDER — PREDNISONE 20 MG PO TABS
40.0000 mg | ORAL_TABLET | Freq: Every day | ORAL | Status: DC
  Administered 2018-03-20 – 2018-03-21 (×2): 40 mg via ORAL
  Filled 2018-03-19 (×2): qty 2

## 2018-03-19 MED ORDER — ONDANSETRON HCL 4 MG/2ML IJ SOLN
4.0000 mg | Freq: Four times a day (QID) | INTRAMUSCULAR | Status: DC | PRN
  Administered 2018-03-22: 4 mg via INTRAVENOUS
  Filled 2018-03-19: qty 2

## 2018-03-19 MED ORDER — SODIUM CHLORIDE 0.9% FLUSH
3.0000 mL | Freq: Once | INTRAVENOUS | Status: AC
  Administered 2018-03-19: 3 mL via INTRAVENOUS

## 2018-03-19 NOTE — H&P (Signed)
HISTORY AND PHYSICAL       PATIENT DETAILS Name: Darlene Stafford Age: 68 y.o. Sex: female Date of Birth: [birth year] Admit Date: 03/19/2018 MLY:[id number], Jannifer Rodney, MD   Patient coming from: Home   CHIEF COMPLAINT:  Fever, weakness, dysuria, productive cough for 3 days For today.  HPI: Darlene Stafford is a 68 y.o. female with medical history significant of sarcoidosis on chronic prednisone, asthma, CKD stage III, gout, prior history of CVA without any focal deficits presenting to the hospital for the above-noted complaints.  Per patient for the past 3 days-she has had worsening generalized weakness, subjective fever (never measured her temp at home) with associated chills and rigors.  She has developed change in her cough-it is now more productive with yellowish phlegm.  She also complains of dysuria and occasional suprapubic tenderness.  Also started to develop more malaise and fatigue-and had a mechanical fall.  She was subsequently brought to the emergency room, where she was found to have fever-the hospitalist service was asked to admit this patient for further evaluation and treatment.  Patient denies any headache, chest pain, nausea, vomiting.  She does acknowledge "diarrhea" but apparently has not had any bowel movement today-and had only one slightly loose bowel movement yesterday.  Denies any abdominal pain.   Patient was evaluated in the emergency room on 1/31 for shortness of breath-thought to have sarcoidosis flare-given steroids with some improvement, but subsequently sent home.  ED Course:  Found to be febrile-UA positive, chest x-ray with chronic changes-given vancomycin and cefepime.  Note: Lives at: Home Mobility:  Independent Chronic Indwelling Foley:no   REVIEW OF SYSTEMS:  Constitutional:   No  weight loss, night sweats,  Fevers, chills, fatigue.  HEENT:    No headaches, Dysphagia,Tooth/dental problems,Sore throat  Cardio-vascular: No chest  pain,Orthopnea, PND,lower extremity edema, anasarca, palpitations  GI:  No heartburn, indigestion, abdominal pain, nausea, vomiting, diarrhea, melena or hematochezia  Resp: No hemoptysis,plueritic chest pain.   Skin:  No rash or lesions.  GU:  No dysuria, change in color of urine, no urgency or frequency.  No flank pain.  Musculoskeletal: No joint pain or swelling.  No decreased range of motion.  No back pain.  Endocrine: No heat intolerance, no cold intolerance, no polyuria, no polydipsia  Psych: No change in mood or affect. No depression or anxiety.  No memory loss.   ALLERGIES:   Allergies  Allergen Reactions  . Fish Allergy Anaphylaxis  . Shellfish Allergy Anaphylaxis  . Sulfa Antibiotics Hives and Itching    PAST MEDICAL HISTORY: Past Medical History:  Diagnosis Date  . Asthma   . CVA (cerebral vascular accident) ([place])   . Diabetes mellitus without complication ([place])   . Sarcoidosis of other sites    Ocular    PAST SURGICAL HISTORY: Past Surgical History:  Procedure Laterality Date  . PARTIAL HYSTERECTOMY  1998  . REPLACEMENT TOTAL KNEE  2015    MEDICATIONS AT HOME: Prior to Admission medications   Medication Sig Start Date End Date Taking? Authorizing Provider  albuterol (PROVENTIL HFA;VENTOLIN HFA) 108 (90 Base) MCG/ACT inhaler Inhale 1-2 puffs into the lungs every 4 (four) hours as needed for shortness of breath. 01/13/18  Yes Chesley Mires, MD  albuterol (PROVENTIL) (2.5 MG/3ML) 0.083% nebulizer solution Take 3 mLs (2.5 mg total) by nebulization every 6 (six) hours as needed for wheezing or shortness of breath. 04/13/17  Yes Chesley Mires, MD  dextromethorphan-guaiFENesin [facility] DM) 30-600 MG 12hr tablet  Take 1 tablet by mouth 2 (two) times daily as needed (sinus infection).   Yes [provider]  atorvastatin (LIPITOR) 40 MG tablet Take 40 mg by mouth daily.    [provider]  carvedilol (COREG) 12.5 MG tablet Take 12.5 mg by mouth  2 (two) times daily with a meal.    [provider]  fluticasone (FLONASE) 50 MCG/ACT nasal spray Place 1 spray into both nostrils daily. 08/25/16   Chesley Mires, MD  fluticasone (FLOVENT HFA) 44 MCG/ACT inhaler Inhale 2 puffs into the lungs 2 (two) times daily. 02/05/17   Rosita Fire, MD  guaiFENesin (MUCINEX) 600 MG 12 hr tablet Take 1 tablet (600 mg total) by mouth 2 (two) times daily. 04/14/15   Barton Dubois, MD  montelukast (SINGULAIR) 10 MG tablet Take 1 tablet (10 mg total) by mouth at bedtime. 08/22/16   Chesley Mires, MD  omeprazole (PRILOSEC) 20 MG capsule Take 20 mg by mouth daily. 02/25/18   [provider]  predniSONE (DELTASONE) 2.5 MG tablet Take 1 tablet (2.5 mg total) by mouth daily with breakfast. 03/17/18   Chesley Mires, MD    FAMILY HISTORY: Family History  Problem Relation Age of Onset  . Sarcoidosis Sister   . Breast cancer Sister   . Sarcoidosis Sister   . Sarcoidosis Sister     SOCIAL HISTORY:  reports that she has never smoked. She has never used smokeless tobacco. She reports that she does not drink alcohol or use drugs.  PHYSICAL EXAM: Blood pressure (!) 123/97, pulse (!) 101, temperature (S) (!) 103 F (39.4 C), temperature source Rectal, resp. rate (!) 21, height 5\' 1"  (1.549 m), weight 113 kg, SpO2 98 %.  General appearance :Awake, alert, not in any distress.  Eyes:.Pink conjunctiva HEENT: Atraumatic and Normocephalic Neck: supple, no JVD.  Resp:Good air entry bilaterally, no added sounds heard anteriorly CVS: S1 S2 regular GI: Bowel sounds present, Non tender and not distended with no gaurding, rigidity or rebound. Extremities: B/L Lower Ext shows trace edema, both legs are warm to touch Neurology:  speech clear,Non focal, sensation is grossly intact. Psychiatric: Normal judgment and insight. Alert and oriented x 3. Normal mood. Musculoskeletal:gait appears to be normal.No digital cyanosis Skin:No Rash, warm and  dry Wounds:N/A  LABS ON ADMISSION:  I have personally reviewed following labs and imaging studies  CBC: Recent Labs  Lab 03/19/18 1205  WBC 11.4*  NEUTROABS 7.3  HGB 11.1*  HCT 34.5*  MCV 88.0  PLT 405*    Basic Metabolic Panel: Recent Labs  Lab 03/19/18 1205  NA 132*  K 3.6  CL 90*  CO2 26  GLUCOSE 142*  BUN 13  CREATININE 1.94*  CALCIUM 8.3*    GFR: Estimated Creatinine Clearance: 32.8 mL/min (A) (by C-G formula based on SCr of 1.94 mg/dL (H)).  Liver Function Tests: Recent Labs  Lab 03/19/18 1205  AST 29  ALT 13  ALKPHOS 81  BILITOT 4.3*  PROT 6.8  ALBUMIN 3.0*   No results for input(s): LIPASE, AMYLASE in the last 168 hours. No results for input(s): AMMONIA in the last 168 hours.  Coagulation Profile: No results for input(s): INR, PROTIME in the last 168 hours.  Cardiac Enzymes: No results for input(s): CKTOTAL, CKMB, CKMBINDEX, TROPONINI in the last 168 hours.  BNP (last 3 results) No results for input(s): PROBNP in the last 8760 hours.  HbA1C: No results for input(s): HGBA1C in the last 72 hours.  CBG: No results for input(s):  GLUCAP in the last 168 hours.  Lipid Profile: No results for input(s): CHOL, HDL, LDLCALC, TRIG, CHOLHDL, LDLDIRECT in the last 72 hours.  Thyroid Function Tests: No results for input(s): TSH, T4TOTAL, FREET4, T3FREE, THYROIDAB in the last 72 hours.  Anemia Panel: No results for input(s): VITAMINB12, FOLATE, FERRITIN, TIBC, IRON, RETICCTPCT in the last 72 hours.  Urine analysis:    Component Value Date/Time   COLORURINE AMBER (A) 03/19/2018 1205   APPEARANCEUR CLOUDY (A) 03/19/2018 1205   LABSPEC 1.013 03/19/2018 1205   PHURINE 5.0 03/19/2018 1205   GLUCOSEU NEGATIVE 03/19/2018 1205   HGBUR SMALL (A) 03/19/2018 1205   BILIRUBINUR NEGATIVE 03/19/2018 1205   KETONESUR 5 (A) 03/19/2018 1205   PROTEINUR 30 (A) 03/19/2018 1205   NITRITE NEGATIVE 03/19/2018 1205   LEUKOCYTESUR MODERATE (A) 03/19/2018 1205     Sepsis Labs: Lactic Acid, Venous    Component Value Date/Time   LATICACIDVEN 3.1 (HH) 03/19/2018 1405     Microbiology: No results found for this or any previous visit (from the past 240 hour(s)).    RADIOLOGIC STUDIES ON ADMISSION: Dg Chest 2 View  Result Date: 03/19/2018 CLINICAL DATA:  Fever.  Sarcoidosis. EXAM: CHEST - 2 VIEW COMPARISON:  Chest x-rays dated 03/05/2018 and 04/30/2017 and chest CT dated 11/03/2016 FINDINGS: Heart size and pulmonary vascularity are normal. Extensive chronic changes in the right lung and to a lesser degree at the left lung base as demonstrated on prior chest x-rays and chest CT, consistent with the patient's history of sarcoidosis. No acute abnormalities. No effusions.  No bone abnormality. IMPRESSION: 1. Severe chronic lung disease. 2. No acute abnormalities. Electronically Signed   By: Lorriane Shire M.D.   On: 03/19/2018 13:30    I have personally reviewed images of chest xray: Appears to have chronic lung changes mostly in the right lung.  Unable to assess infiltrate given chronic changes.  EKG:  Personally reviewed.  RBBB pattern  ASSESSMENT AND PLAN: Sepsis: Foci not apparent-does have symptoms that point both to the respiratory and urinary tract.  Chest x-ray with a lot of chronic changes due to sarcoidosis-unable to assess whether she has pneumonia in addition to her chronic changes, does have history of worsening cough with yellow-colored phlegm.  She also has UA consistent with UTI-along with history of dysuria and suprapubic pain.  For now will cover with vancomycin and cefepime (immunosuppressed is on chronic steroids-and with fairly advanced sarcoidosis)-await blood/urine cultures.  We will also send out a influenza PCR.    AKI: Likely hemodynamically mediated in the setting of sepsis-avoid nephrotoxic agents, follow electrolytes.  Acute gouty arthritis: Claims to have pain in her numerous toes of her bilateral foot-she claims it is  very similar to prior gouty flares.  She is on chronic prednisone for sarcoidosis-we will give 1 dose of IV Solu-Medrol, and subsequently start prednisone 40 mg p.o. daily which can be tapered off to her usual home regimen over the next few days.  Asthma: No evidence of flare-we will place on bronchodilators.  Sarcoidosis: Appears to have fairly advanced sarcoidosis-has extensive scarring in her chest x-ray-maintained on prednisone chronically.  Follows with Dr. Halford Chessman in the outpatient setting.  Morbid obesity.  Further plan will depend as patient's clinical course evolves and further radiologic and laboratory data become available. Patient will be monitored closely.  Above noted plan was discussed with patient face to face at bedside, she was in agreement.   CONSULTS: None  DVT Prophylaxis: Prophylactic Lovenox   Code  Status: Full Code  Disposition Plan:  Discharge back home  possibly in 2-3 days, depending on clinical course  Admission status: Inpatient  going toSDU  The medical decision making on this patient was of high complexity and the patient is at high risk for clinical deterioration, therefore this is a level 3 visit.   Total time spent  55 minutes.Greater than 50% of this time was spent in counseling, explanation of diagnosis, planning of further management, and coordination of care.  Severity of illness:  The appropriate patient status for this patient is INPATIENT. Inpatient status is judged to be reasonable and necessary in order to provide the required intensity of service to ensure the patient's safety. The patient's presenting symptoms, physical exam findings, and initial radiographic and laboratory data in the context of their chronic comorbidities is felt to place them at high risk for further clinical deterioration. Furthermore, it is not anticipated that the patient will be medically stable for discharge from the hospital within 2 midnights of admission. The  following factors support the patient status of inpatient.   " The patient's presenting symptoms include fever, weakness, cough, dysuria " The worrisome physical exam findings include debility, morbid obesity " The initial radiographic and laboratory data are worrisome because of lung scarring, AKI " The chronic co-morbidities include advanced sarcoidosis, immunocompromise is on prednisone, morbid obesity   * I certify that at the point of admission it is my clinical judgment that the patient will require inpatient hospital care spanning beyond 2 midnights from the point of admission due to high intensity of service, high risk for further deterioration and high frequency of surveillance required  Oren Binet [facility] Pager [phone]  If 7PM-7AM, please contact night-coverage  Please page via [email or web address]  Go to [email or web address] and use [facility] universal password to access. If you do not have the password, please contact the hospital operator.  Locate the [facility] provider you are looking for under [facility] and page to a number that you can be directly reached. If you still have difficulty reaching the provider, please page the [facility] (Director on Call) for the Hospitalists listed on amion for assistance.  03/19/2018, 3:45 PM

## 2018-03-19 NOTE — ED Provider Notes (Signed)
[facility] EMERGENCY DEPARTMENT Provider Note   CSN: [id number] Arrival date & time: 03/19/18  1147     History   Chief Complaint Chief Complaint  Patient presents with  . Fall  . Fever    HPI Cade Olberding is a 68 y.o. female.  HPI  68 year old female, known history of stroke asthma and diabetes.  She also has a known history of sarcoidosis in her eye.  She had recently been seen in the emergency department when she was having increasing shortness of breath on January 31 however the family took her home instead of being admitted to the hospital for this increased respiratory distress.  She comes back today complaining of generalized weakness that prompted a fall.  She was noted to have a fever as high as 103 on arrival and reports that she has had some frequent coughing.  She feels generally weak, she is nauseated, she has had very little to eat or drink, she denies specifically having chest pain or abdominal pain but does feel short of breath and though this is chronic it feels worse today.  Past Medical History:  Diagnosis Date  . Asthma   . CVA (cerebral vascular accident) ([place])   . Diabetes mellitus without complication ([place])   . Sarcoidosis of other sites    Ocular    Patient Active Problem List   Diagnosis Date Noted  . Acute bronchitis 04/30/2017  . Hemoptysis 04/30/2017  . Sarcoidosis 02/03/2017  . CKD (chronic kidney disease), stage III ([place]) 02/03/2017  . Morbid obesity due to excess calories ([place])   . CAP (community acquired pneumonia) 04/10/2015  . Diabetes mellitus without complication ([place]) 41/74/0814  . Asthma 04/10/2015  . Acute encephalopathy 04/10/2015  . UTI (lower urinary tract infection) 04/10/2015  . AKI (acute kidney injury) ([place]) 04/10/2015  . Sepsis ([place]) 04/10/2015  . Hypotension 04/10/2015    Past Surgical History:  Procedure Laterality Date  . PARTIAL HYSTERECTOMY  1998  . REPLACEMENT TOTAL KNEE  2015     OB  History   No obstetric history on file.      Home Medications    Prior to Admission medications   Medication Sig Start Date End Date Taking? Authorizing Provider  albuterol (PROVENTIL HFA;VENTOLIN HFA) 108 (90 Base) MCG/ACT inhaler Inhale 1-2 puffs into the lungs every 4 (four) hours as needed for shortness of breath. 01/13/18   Chesley Mires, MD  albuterol (PROVENTIL) (2.5 MG/3ML) 0.083% nebulizer solution Take 3 mLs (2.5 mg total) by nebulization every 6 (six) hours as needed for wheezing or shortness of breath. 04/13/17   Chesley Mires, MD  atorvastatin (LIPITOR) 40 MG tablet Take 40 mg by mouth daily.    [provider]  carvedilol (COREG) 12.5 MG tablet Take 12.5 mg by mouth 2 (two) times daily with a meal.    [provider]  dextromethorphan-guaiFENesin (MUCINEX DM) 30-600 MG 12hr tablet Take 1 tablet by mouth 2 (two) times daily as needed (sinus infection).    [provider]  fluticasone (FLONASE) 50 MCG/ACT nasal spray Place 1 spray into both nostrils daily. 08/25/16   Chesley Mires, MD  fluticasone (FLOVENT HFA) 44 MCG/ACT inhaler Inhale 2 puffs into the lungs 2 (two) times daily. 02/05/17   Rosita Fire, MD  guaiFENesin (MUCINEX) 600 MG 12 hr tablet Take 1 tablet (600 mg total) by mouth 2 (two) times daily. 04/14/15   Barton Dubois, MD  montelukast (SINGULAIR) 10 MG tablet Take 1 tablet (10 mg  total) by mouth at bedtime. 08/22/16   Chesley Mires, MD  omeprazole (PRILOSEC) 20 MG capsule Take 20 mg by mouth daily. 02/25/18   [provider]  predniSONE (DELTASONE) 2.5 MG tablet Take 1 tablet (2.5 mg total) by mouth daily with breakfast. 03/17/18   Chesley Mires, MD    Family History Family History  Problem Relation Age of Onset  . Sarcoidosis Sister   . Breast cancer Sister   . Sarcoidosis Sister   . Sarcoidosis Sister     Social History Social History   Tobacco Use  . Smoking status: Never Smoker  . Smokeless tobacco: Never Used    Substance Use Topics  . Alcohol use: No  . Drug use: No     Allergies   Fish allergy; Shellfish allergy; and Sulfa antibiotics   Review of Systems Review of Systems  All other systems reviewed and are negative.    Physical Exam Updated Vital Signs BP (!) 123/97   Pulse (!) 101   Temp (S) (!) 103 F (39.4 C) (Rectal)   Resp (!) 21   Ht 1.549 m (5\' 1" )   Wt 113 kg   SpO2 98%   BMI 47.07 kg/m   Physical Exam Vitals signs and nursing note reviewed.  Constitutional:      General: She is in acute distress.     Appearance: She is well-developed. She is ill-appearing.  HENT:     Head: Normocephalic and atraumatic.     Mouth/Throat:     Mouth: Mucous membranes are dry.     Pharynx: No oropharyngeal exudate or posterior oropharyngeal erythema.  Eyes:     General: No scleral icterus.       Right eye: No discharge.        Left eye: No discharge.     Conjunctiva/sclera: Conjunctivae normal.     Pupils: Pupils are equal, round, and reactive to light.  Neck:     Musculoskeletal: Normal range of motion and neck supple.     Thyroid: No thyromegaly.     Vascular: No JVD.  Cardiovascular:     Rate and Rhythm: Regular rhythm. Tachycardia present.     Heart sounds: Normal heart sounds. No murmur. No friction rub. No gallop.   Pulmonary:     Effort: Respiratory distress present.     Breath sounds: Wheezing and rales present.  Abdominal:     General: Bowel sounds are normal. There is no distension.     Palpations: Abdomen is soft. There is no mass.     Tenderness: There is no abdominal tenderness.  Musculoskeletal: Normal range of motion.        General: No tenderness.  Lymphadenopathy:     Cervical: No cervical adenopathy.  Skin:    General: Skin is warm and dry.     Findings: No erythema or rash.  Neurological:     Mental Status: She is alert.     Coordination: Coordination normal.  Psychiatric:        Behavior: Behavior normal.      ED Treatments / Results   Labs (all labs ordered are listed, but only abnormal results are displayed) Labs Reviewed  COMPREHENSIVE METABOLIC PANEL - Abnormal; Notable for the following components:      Result Value   Sodium 132 (*)    Chloride 90 (*)    Glucose, Bld 142 (*)    Creatinine, Ser 1.94 (*)    Calcium 8.3 (*)    Albumin 3.0 (*)  Total Bilirubin 4.3 (*)    GFR calc non Af Amer 26 (*)    GFR calc Af Amer 30 (*)    Anion gap 16 (*)    All other components within normal limits  LACTIC ACID, PLASMA - Abnormal; Notable for the following components:   Lactic Acid, Venous 2.4 (*)    All other components within normal limits  CBC WITH DIFFERENTIAL/PLATELET - Abnormal; Notable for the following components:   WBC 11.4 (*)    Hemoglobin 11.1 (*)    HCT 34.5 (*)    Platelets 405 (*)    Monocytes Absolute 1.6 (*)    Abs Immature Granulocytes 0.18 (*)    All other components within normal limits  URINALYSIS, ROUTINE W REFLEX MICROSCOPIC - Abnormal; Notable for the following components:   Color, Urine AMBER (*)    APPearance CLOUDY (*)    Hgb urine dipstick SMALL (*)    Ketones, ur 5 (*)    Protein, ur 30 (*)    Leukocytes,Ua MODERATE (*)    WBC, UA >50 (*)    Bacteria, UA MANY (*)    All other components within normal limits  CULTURE, BLOOD (ROUTINE X 2)  CULTURE, BLOOD (ROUTINE X 2)  C DIFFICILE QUICK SCREEN W PCR REFLEX  URINE CULTURE  LACTIC ACID, PLASMA  OCCULT BLOOD X 1 CARD TO LAB, STOOL  INFLUENZA PANEL BY PCR (TYPE A & B)    EKG EKG Interpretation  Date/Time:  Friday March 19 2018 12:20:35 EST Ventricular Rate:  104 PR Interval:    QRS Duration: 136 QT Interval:  390 QTC Calculation: 513 R Axis:   -87 Text Interpretation:  Sinus tachycardia RBBB and LAFB Baseline wander in lead(s) V3 since last tracing no significant change Confirmed by Noemi Chapel [phone]) on 03/19/2018 12:29:41 PM   Radiology Dg Chest 2 View  Result Date: 03/19/2018 CLINICAL DATA:  Fever.   Sarcoidosis. EXAM: CHEST - 2 VIEW COMPARISON:  Chest x-rays dated 03/05/2018 and 04/30/2017 and chest CT dated 11/03/2016 FINDINGS: Heart size and pulmonary vascularity are normal. Extensive chronic changes in the right lung and to a lesser degree at the left lung base as demonstrated on prior chest x-rays and chest CT, consistent with the patient's history of sarcoidosis. No acute abnormalities. No effusions.  No bone abnormality. IMPRESSION: 1. Severe chronic lung disease. 2. No acute abnormalities. Electronically Signed   By: Lorriane Shire M.D.   On: 03/19/2018 13:30    Procedures .Critical Care Performed by: Noemi Chapel, MD Authorized by: Noemi Chapel, MD   Critical care provider statement:    Critical care time (minutes):  35   Critical care time was exclusive of:  Separately billable procedures and treating other patients and teaching time   Critical care was necessary to treat or prevent imminent or life-threatening deterioration of the following conditions:  Respiratory failure and sepsis   Critical care was time spent personally by me on the following activities:  Blood draw for specimens, development of treatment plan with patient or surrogate, discussions with consultants, evaluation of patient's response to treatment, examination of patient, obtaining history from patient or surrogate, ordering and performing treatments and interventions, ordering and review of laboratory studies, ordering and review of radiographic studies, pulse oximetry, re-evaluation of patient's condition and review of old charts   (including critical care time)  Medications Ordered in ED Medications  vancomycin (VANCOCIN) 2,000 mg in sodium chloride 0.9 % 500 mL IVPB (has no administration in time range)  ceFEPIme (MAXIPIME) 2 g in sodium chloride 0.9 % 100 mL IVPB (has no administration in time range)  vancomycin (VANCOCIN) 1,250 mg in sodium chloride 0.9 % 250 mL IVPB (has no administration in time range)    sodium chloride flush (NS) 0.9 % injection 3 mL (3 mLs Intravenous Given 03/19/18 1329)  ceFEPIme (MAXIPIME) 2 g in sodium chloride 0.9 % 100 mL IVPB (0 g Intravenous Stopped 03/19/18 1441)  sodium chloride 0.9 % bolus 1,000 mL (1,000 mLs Intravenous New Bag/Given 03/19/18 1440)    And  sodium chloride 0.9 % bolus 500 mL (500 mLs Intravenous New Bag/Given 03/19/18 1441)  acetaminophen (TYLENOL) tablet 1,000 mg (1,000 mg Oral Given 03/19/18 1440)     Initial Impression / Assessment and Plan / ED Course  I have reviewed the triage vital signs and the nursing notes.  Pertinent labs & imaging results that were available during my care of the patient were reviewed by me and considered in my medical decision making (see chart for details).     In addition to the symptoms of respiratory distress the patient is also having foul-smelling diarrhea which is been going on for some time.  We will obtain a C. difficile sample, code sepsis was activated and antibiotics will be added to cover pneumonia given her abnormal lung exam with classic findings with cough and fever.  Influenza sample will also be taken, the patient is awake and alert and talking though she is hypotensive and tachycardic consistent with a sepsis state.  The patient is critically ill.  Abnormal lab findings   Lactic acid of 2.4  White blood cell count of 11,400  Creatinine of 1.94 (up from closer to 1.5)  I have personally looked at the chest x-ray, my interpretation is that this patient has ongoing chronic lung disease.  Right side of the chest appears worse than the left side.  These are extensive changes.  According to the radiologist the right lung and to a lesser degree the left lung has chronic lung disease.  There is a history of sarcoidosis.  There is no changes from her prior x-ray.  IV fluids at 30 cc/kg were ordered, ideal body weight was used due to the patient's morbidly obese body habitus.  D/w Dr. Sloan Leiter who will  admit.  Final Clinical Impressions(s) / ED Diagnoses   Final diagnoses:  Severe sepsis ([place])  Urinary tract infection without hematuria, site unspecified  AKI (acute kidney injury) [facility])    ED Discharge Orders    None       Noemi Chapel, MD 03/19/18 1511

## 2018-03-19 NOTE — ED Notes (Signed)
Critical lactic acid value 2.4. EDP Dr. Sabra Heck made aware.

## 2018-03-19 NOTE — Progress Notes (Signed)
Pharmacy Antibiotic Note  Darlene Stafford is a 68 y.o. female admitted on 03/19/2018 with pneumonia.  Pharmacy has been consulted for vancomycin and cefepime dosing. Pt presented with generalized weakness that prompted a fall and reports frequent coughing. Tmax 103, Lac 2.4, WBC 11.4, scr 1.94, CrCL ~32  Vancomycin goal AUC 400-550. Calc AUC 480 Scr 1.94  Plan: Vancomycin 2000 mg IV x1 then 1250mg  Q24H Cefepime 2g Q24H F/u Cxs, clinical status, renal function, LOT/de-escalation, and levels prn   Height: 5\' 1"  (154.9 cm) Weight: 249 lb 1.9 oz (113 kg) IBW/kg (Calculated) : 47.8  Temp (24hrs), Avg:103 F (39.4 C), Min:103 F (39.4 C), Max:103 F (39.4 C)  Recent Labs  Lab 03/19/18 1205 03/19/18 1223  WBC 11.4*  --   CREATININE 1.94*  --   LATICACIDVEN  --  2.4*    Estimated Creatinine Clearance: 32.8 mL/min (A) (by C-G formula based on SCr of 1.94 mg/dL (H)).    Allergies  Allergen Reactions  . Fish Allergy Anaphylaxis  . Shellfish Allergy Anaphylaxis  . Sulfa Antibiotics Hives and Itching    Antimicrobials this admission: vanc 2/14  >>  Cefepime 2/14  >>   Dose adjustments this admission:   Microbiology results: 2/14 BCx:    Thank you for allowing pharmacy to be a part of this patient's care.  Harrietta Guardian, PharmD PGY1 Pharmacy Resident 03/19/2018    2:03 PM Please check AMION for all [place] numbers

## 2018-03-19 NOTE — ED Notes (Signed)
Phlebotomy bedside obtaining second set of blood cultures.

## 2018-03-19 NOTE — ED Notes (Signed)
Report given to Tammy, RN on 5W.

## 2018-03-19 NOTE — ED Triage Notes (Signed)
Pt arrives with [facility] c/o generalized weakness and fall; per EMS pt has not been able to walk for 5-7 days since falling. Pt a&o x4. Pt's daughter stated she noticed right sided facial droop last night but has since resolved. Rectal temperature on arrival 103  EMS vitals 108/60 HR 100 CBG 159 Ox 94 RA

## 2018-03-19 NOTE — ED Notes (Addendum)
Critical value received from [facility]; lactic acid 2.4

## 2018-03-19 NOTE — ED Notes (Signed)
Patient transported to X-ray 

## 2018-03-20 ENCOUNTER — Inpatient Hospital Stay (HOSPITAL_COMMUNITY): Payer: Medicare Other

## 2018-03-20 DIAGNOSIS — A419 Sepsis, unspecified organism: Secondary | ICD-10-CM

## 2018-03-20 DIAGNOSIS — R0609 Other forms of dyspnea: Secondary | ICD-10-CM

## 2018-03-20 DIAGNOSIS — N183 Chronic kidney disease, stage 3 (moderate): Secondary | ICD-10-CM

## 2018-03-20 DIAGNOSIS — D869 Sarcoidosis, unspecified: Secondary | ICD-10-CM

## 2018-03-20 DIAGNOSIS — J45901 Unspecified asthma with (acute) exacerbation: Secondary | ICD-10-CM

## 2018-03-20 LAB — BLOOD CULTURE ID PANEL (REFLEXED)
ACINETOBACTER BAUMANNII: NOT DETECTED
Candida albicans: NOT DETECTED
Candida glabrata: NOT DETECTED
Candida krusei: NOT DETECTED
Candida parapsilosis: NOT DETECTED
Candida tropicalis: NOT DETECTED
Enterobacter cloacae complex: NOT DETECTED
Enterobacteriaceae species: NOT DETECTED
Enterococcus species: NOT DETECTED
Escherichia coli: NOT DETECTED
Haemophilus influenzae: NOT DETECTED
Klebsiella oxytoca: NOT DETECTED
Klebsiella pneumoniae: NOT DETECTED
Listeria monocytogenes: NOT DETECTED
Methicillin resistance: DETECTED — AB
Neisseria meningitidis: NOT DETECTED
Proteus species: NOT DETECTED
Pseudomonas aeruginosa: NOT DETECTED
STREPTOCOCCUS PNEUMONIAE: NOT DETECTED
Serratia marcescens: NOT DETECTED
Staphylococcus aureus (BCID): NOT DETECTED
Staphylococcus species: DETECTED — AB
Streptococcus agalactiae: NOT DETECTED
Streptococcus pyogenes: NOT DETECTED
Streptococcus species: NOT DETECTED

## 2018-03-20 LAB — CBC
HCT: 28 % — ABNORMAL LOW (ref 36.0–46.0)
Hemoglobin: 8.9 g/dL — ABNORMAL LOW (ref 12.0–15.0)
MCH: 27.7 pg (ref 26.0–34.0)
MCHC: 31.8 g/dL (ref 30.0–36.0)
MCV: 87.2 fL (ref 80.0–100.0)
Platelets: 308 10*3/uL (ref 150–400)
RBC: 3.21 MIL/uL — ABNORMAL LOW (ref 3.87–5.11)
RDW: 12.4 % (ref 11.5–15.5)
WBC: 8.6 10*3/uL (ref 4.0–10.5)
nRBC: 0 % (ref 0.0–0.2)

## 2018-03-20 LAB — BASIC METABOLIC PANEL
Anion gap: 16 — ABNORMAL HIGH (ref 5–15)
BUN: 17 mg/dL (ref 8–23)
CO2: 24 mmol/L (ref 22–32)
Calcium: 7.7 mg/dL — ABNORMAL LOW (ref 8.9–10.3)
Chloride: 94 mmol/L — ABNORMAL LOW (ref 98–111)
Creatinine, Ser: 1.89 mg/dL — ABNORMAL HIGH (ref 0.44–1.00)
GFR calc Af Amer: 31 mL/min — ABNORMAL LOW (ref >60)
GFR calc non Af Amer: 27 mL/min — ABNORMAL LOW (ref >60)
Glucose, Bld: 153 mg/dL — ABNORMAL HIGH (ref 70–99)
Potassium: 3.8 mmol/L (ref 3.5–5.1)
Sodium: 134 mmol/L — ABNORMAL LOW (ref 135–145)

## 2018-03-20 LAB — ECHOCARDIOGRAM COMPLETE
Height: 61 in
WEIGHTICAEL: 3985.92 [oz_av]

## 2018-03-20 MED ORDER — ALBUTEROL SULFATE (2.5 MG/3ML) 0.083% IN NEBU: 2.5000 mg | INHALATION_SOLUTION | Freq: Two times a day (BID) | RESPIRATORY_TRACT | Status: DC

## 2018-03-20 MED ORDER — NYSTATIN 100000 UNIT/GM EX POWD
Freq: Three times a day (TID) | CUTANEOUS | Status: DC
  Administered 2018-03-20 (×2): via TOPICAL
  Administered 2018-03-20: 1 via TOPICAL
  Administered 2018-03-21 – 2018-03-22 (×4): via TOPICAL
  Filled 2018-03-20: qty 15

## 2018-03-20 MED ORDER — ALBUTEROL SULFATE (2.5 MG/3ML) 0.083% IN NEBU
2.5000 mg | INHALATION_SOLUTION | Freq: Two times a day (BID) | RESPIRATORY_TRACT | Status: DC
  Administered 2018-03-20 – 2018-03-22 (×4): 2.5 mg via RESPIRATORY_TRACT
  Filled 2018-03-20 (×4): qty 3

## 2018-03-20 NOTE — Evaluation (Signed)
Physical Therapy Evaluation Patient Details Name: Darlene Stafford MRN: [id number] DOB: 1950/06/29 Today's Date: 03/20/2018   History of Present Illness  Pt is a 68 y.o. female admitted 03/19/18 with worsening generalized weakness and chills. Worked up for sepsis due to UTI. PMH includes sarcoidosis (on chronic prednisone), asthma, CKD III, CVA.    Clinical Impression  Pt presents with an overall decrease in functional mobility secondary to above. PTA, pt indep with ambulation; lives with granddaughter who provides assist with ADLs PRN. Today, pt required minA for mobility with RW; limited by bilateral foot pain and fatigue. Pt would benefit from continued acute PT services to maximize functional mobility and independence prior to d/c with SNF-level therapies.     Follow Up Recommendations SNF;Supervision for mobility/OOB    Equipment Recommendations  None recommended by PT    Recommendations for Other Services       Precautions / Restrictions Precautions Precautions: Fall Restrictions Weight Bearing Restrictions: No      Mobility  Bed Mobility Overal bed mobility: Needs Assistance Bed Mobility: Supine to Sit     Supine to sit: Supervision;HOB elevated        Transfers Overall transfer level: Needs assistance Equipment used: Rolling walker (2 wheeled) Transfers: Sit to/from Stand Sit to Stand: Min assist         General transfer comment: MinA to assist trunk elevation; increased time and effort  Ambulation/Gait Ambulation/Gait assistance: Min guard Gait Distance (Feet): 2 Feet Assistive device: Rolling walker (2 wheeled) Gait Pattern/deviations: Step-to pattern;Wide base of support;Antalgic Gait velocity: Decreased Gait velocity interpretation: <1.31 ft/sec, indicative of household ambulator General Gait Details: Took steps from bed to recliner with RW and close min guard for balance; labored, short steps, limited by foot pain (pt attributes to gout). SpO2 95-97% on  RA  Stairs            Wheelchair Mobility    Modified Rankin (Stroke Patients Only)       Balance Overall balance assessment: Needs assistance   Sitting balance-Leahy Scale: Fair       Standing balance-Leahy Scale: Poor Standing balance comment: Reliant on UE support                             Pertinent Vitals/Pain Pain Assessment: Faces Faces Pain Scale: Hurts little more Pain Location: bilateral feet Pain Descriptors / Indicators: Grimacing;Discomfort Pain Intervention(s): Monitored during session;Limited activity within patient's tolerance    Home Living Family/patient expects to be discharged to:: Private residence Living Arrangements: Other relatives Available Help at Discharge: Family;Available PRN/intermittently Type of Home: House Home Access: Stairs to enter Entrance Stairs-Rails: None Entrance Stairs-Number of Steps: 1 threshold Home Layout: One level Home Equipment: Walker - 2 wheels;Cane - single point;Shower seat;Wheelchair - manual;Bedside commode Additional Comments: Lives with granddaughter who works 10hr/day    Prior Function Level of Independence: Needs Water engineer / Transfers Assistance Needed: Household ambulation with SPC/RW PRN  ADL's / [employer] Assistance Needed: Granddaugther performs household tasks; intermittent assist with ADLs        Hand Dominance        Extremity/Trunk Assessment   Upper Extremity Assessment Upper Extremity Assessment: Generalized weakness    Lower Extremity Assessment Lower Extremity Assessment: Generalized weakness    Cervical / Trunk Assessment Cervical / Trunk Assessment: Kyphotic  Communication   Communication: No difficulties  Cognition Arousal/Alertness: Awake/alert Behavior During Therapy: Flat affect Overall Cognitive Status: Within Functional Limits for  tasks assessed                                        General Comments General comments (skin  integrity, edema, etc.): Granddaughter present and reports she cannot assist pt at home with work schedule    Exercises     Assessment/Plan    PT Assessment Patient needs continued PT services  PT Problem List Decreased strength;Decreased activity tolerance;Decreased balance;Decreased mobility       PT Treatment Interventions DME instruction;Gait training;Stair training;Functional mobility training;Therapeutic activities;Therapeutic exercise;Balance training;Patient/family education    PT Goals (Current goals can be found in the Care Plan section)  Acute Rehab PT Goals Patient Stated Goal: Return home with [facility] services PT Goal Formulation: With patient Time For Goal Achievement: 04/03/18 Potential to Achieve Goals: Fair    Frequency Min 3X/week   Barriers to discharge Decreased caregiver support      Co-evaluation               AM-PAC PT "6 Clicks" Mobility  Outcome Measure Help needed turning from your back to your side while in a flat bed without using bedrails?: A Little Help needed moving from lying on your back to sitting on the side of a flat bed without using bedrails?: A Little Help needed moving to and from a bed to a chair (including a wheelchair)?: A Little Help needed standing up from a chair using your arms (e.g., wheelchair or bedside chair)?: A Little Help needed to walk in hospital room?: A Little Help needed climbing 3-5 steps with a railing? : A Lot 6 Click Score: 17    End of Session Equipment Utilized During Treatment: Gait belt Activity Tolerance: Patient tolerated treatment well;Patient limited by fatigue Patient left: in chair;with call bell/phone within reach;with chair alarm set Nurse Communication: Mobility status PT Visit Diagnosis: Other abnormalities of gait and mobility (R26.89);Muscle weakness (generalized) (M62.81)    Time: 3435-6861 PT Time Calculation (min) (ACUTE ONLY): 24 min   Charges:   PT Evaluation $PT Eval Moderate  Complexity: 1 Mod PT Treatments $Therapeutic Activity: 8-22 mins      Mabeline Caras, PT, DPT Acute Rehabilitation Services  Pager [phone] Office [place] 03/20/2018, 1:59 PM

## 2018-03-20 NOTE — Progress Notes (Signed)
0100 patient incontinent of stool. During patient care observed significant MASD under breasts. Replaced foam on sacrum.Significant MASD on buttocks (bilateral), excoriation on hips and sides near breasts. Skin tears on sacrum due to MASD. Cleansed and applied powder to area. Patient inquired about wearing briefs in hospital like she wears at home. Informed patient briefs can increase risk and exacerbate MASD.   Paged NP Baltazar Najjar at [facility] about dysuria and bladder scan of 322 mL. I&O cath at 0440 per one time order. 600 mL urine removed. Urine cloudy, amber colored and malodorous.   Right AC IV occluded and left forearm IV infiltrated. Both removed. Ordered IV team consult. New IV inserted by IV team.   Assisted patient with calling her son Georgina Snell) at approximately 0345 per patient's request; no answer and patient left voicemail. RN attempted to call patient's son Georgina Snell (name/number listed as emergency contact) at [phone] per patient's request; no answer and did not leave voicemail.

## 2018-03-20 NOTE — Progress Notes (Signed)
PROGRESS NOTE                                                                                                                                                                                                             Patient Demographics:    Darlene Stafford, is a 68 y.o. female, DOB - 1951-01-16, VQQ:[id number]  Admit date - 03/19/2018   Admitting Physician Evalee Mutton Kristeen Mans, MD  Outpatient Primary MD for the patient is Briscoe, Jannifer Rodney, MD  LOS - 1  Chief Complaint  Patient presents with  . Fall  . Fever       Brief Narrative Darlene Stafford is a 68 y.o. female with medical history significant of sarcoidosis on chronic prednisone, asthma, CKD stage III, gout, prior history of CVA without any focal deficits presenting to the hospital for the above-noted complaints.  Per patient for the past 3 days-she has had worsening generalized weakness, subjective fever (never measured her temp at home) with associated chills and rigors   Subjective:    [name] today has, No headache, No chest pain, No abdominal pain - No Nausea, No new weakness tingling or numbness, No Cough - SOB.     Assessment  & Plan :   Sepsis: Due to UTI, sepsis pathophysiology has resolved after IV antibiotics and IV fluids over 24 hours, stop antibiotics, follow cultures, so far blood cultures negative, influenza panel is negative, stop vancomycin and continue cefepime.  Follow cultures.  Increase activity.  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  Acute gouty arthritis: Claims to have pain in her numerous toes of her bilateral foot-she claims it is very similar to prior gouty flares.  She is on chronic prednisone for sarcoidosis-she was given 1 dose of IV Solu-Medrol in the ER which resolution of her symptoms, currently on 40 of prednisone will gradually taper to her home dose.  Asthma: No evidence of flare-we will place on bronchodilators.  Sarcoidosis: Appears  to have fairly advanced sarcoidosis-has extensive scarring in her chest x-ray-maintained on prednisone chronically.  Follows with Dr. Halford Chessman in the outpatient setting.  Morbid obesity.  Further plan will depend as patient's clinical course evolves and further radiologic and laboratory data become available. Patient will be monitored closely.    Family Communication  : None  Code Status :  Full  Disposition Plan  :  Home in 1-2 days  Consults  :  None  Procedures  :  TTE -   DVT Prophylaxis  :  Lovenox   Lab Results  Component Value Date   PLT 308 03/20/2018    Diet :  Diet Order            Diet Heart Room service appropriate? Yes; Fluid consistency: Thin  Diet effective now               Inpatient Medications Scheduled Meds: . arformoterol  15 mcg Nebulization BID  . atorvastatin  40 mg Oral Daily  . budesonide (PULMICORT) nebulizer solution  0.25 mg Nebulization BID  . enoxaparin (LOVENOX) injection  40 mg Subcutaneous Q24H  . fluticasone  1 spray Each Nare Daily  . guaiFENesin  600 mg Oral BID  . montelukast  10 mg Oral QHS  . nystatin   Topical TID  . pantoprazole  40 mg Oral Daily  . predniSONE  40 mg Oral Q breakfast   Continuous Infusions: . ceFEPime (MAXIPIME) IV     PRN Meds:.acetaminophen **OR** [DISCONTINUED] acetaminophen, albuterol, hydrALAZINE, ondansetron **OR** ondansetron (ZOFRAN) IV  Antibiotics  :   Anti-infectives (From admission, onward)   Start     Dose/Rate Route Frequency Ordered Stop   03/20/18 1400  vancomycin (VANCOCIN) 1,250 mg in sodium chloride 0.9 % 250 mL IVPB  Status:  Discontinued     1,250 mg 166.7 mL/hr over 90 Minutes Intravenous Every 24 hours 03/19/18 1416 03/20/18 1027   03/20/18 1300  ceFEPIme (MAXIPIME) 2 g in sodium chloride 0.9 % 100 mL IVPB     2 g 200 mL/hr over 30 Minutes Intravenous Every 24 hours 03/19/18 1252     03/19/18 1300  vancomycin (VANCOCIN) 2,000 mg in sodium chloride 0.9 % 500 mL IVPB     2,000  mg 250 mL/hr over 120 Minutes Intravenous  Once 03/19/18 1252 03/19/18 1905   03/19/18 1215  vancomycin (VANCOCIN) IVPB 1000 mg/200 mL premix  Status:  Discontinued     1,000 mg 200 mL/hr over 60 Minutes Intravenous  Once 03/19/18 1214 03/19/18 1250   03/19/18 1215  ceFEPIme (MAXIPIME) 2 g in sodium chloride 0.9 % 100 mL IVPB     2 g 200 mL/hr over 30 Minutes Intravenous  Once 03/19/18 1214 03/19/18 1441          Objective:   Vitals:   03/20/18 0037 03/20/18 0127 03/20/18 0344 03/20/18 0920  BP: 98/63 112/64 116/68 128/73  Pulse: 81 80 78 75  Resp: 18 16 (!) 21 (!) 21  Temp: (!) 97.3 F (36.3 C) 98 F (36.7 C) 97.9 F (36.6 C) 98 F (36.7 C)  TempSrc: Oral Oral Oral Oral  SpO2: 100% 100% 100% 100%  Weight:      Height:        Wt Readings from Last 3 Encounters:  03/19/18 113 kg  03/17/18 113.4 kg  03/05/18 113.4 kg     Intake/Output Summary (Last 24 hours) at 03/20/2018 1034 Last data filed at 03/20/2018 0440 Gross per 24 hour  Intake -  Output 1200 ml  Net -1200 ml     Physical Exam  Awake Alert, Oriented X 3, No new F.N deficits, Normal affect [place].AT,PERRAL Supple Neck,No JVD, No cervical lymphadenopathy appriciated.  Symmetrical Chest wall movement, Good air movement bilaterally, CTAB RRR,No Gallops,Rubs or new Murmurs, No Parasternal Heave +ve B.Sounds, Abd Soft, No tenderness, No organomegaly appriciated, No rebound - guarding or rigidity. No Cyanosis, Clubbing or edema, No new Rash or bruise      Data  Review:    CBC Recent Labs  Lab 03/19/18 1205 03/20/18 0536  WBC 11.4* 8.6  HGB 11.1* 8.9*  HCT 34.5* 28.0*  PLT 405* 308  MCV 88.0 87.2  MCH 28.3 27.7  MCHC 32.2 31.8  RDW 12.5 12.4  LYMPHSABS 2.0  --   MONOABS 1.6*  --   EOSABS 0.3  --   BASOSABS 0.1  --     Chemistries  Recent Labs  Lab 03/19/18 1205 03/20/18 0536  NA 132* 134*  K 3.6 3.8  CL 90* 94*  CO2 26 24  GLUCOSE 142* 153*  BUN 13 17  CREATININE 1.94* 1.89*  CALCIUM  8.3* 7.7*  AST 29  --   ALT 13  --   ALKPHOS 81  --   BILITOT 4.3*  --    ------------------------------------------------------------------------------------------------------------------ No results for input(s): CHOL, HDL, LDLCALC, TRIG, CHOLHDL, LDLDIRECT in the last 72 hours.  Lab Results  Component Value Date   HGBA1C 5.8 (H) 04/11/2015   ------------------------------------------------------------------------------------------------------------------ No results for input(s): TSH, T4TOTAL, T3FREE, THYROIDAB in the last 72 hours.  Invalid input(s): FREET3 ------------------------------------------------------------------------------------------------------------------ No results for input(s): VITAMINB12, FOLATE, FERRITIN, TIBC, IRON, RETICCTPCT in the last 72 hours.  Coagulation profile No results for input(s): INR, PROTIME in the last 168 hours.  No results for input(s): DDIMER in the last 72 hours.  Cardiac Enzymes No results for input(s): CKMB, TROPONINI, MYOGLOBIN in the last 168 hours.  Invalid input(s): CK ------------------------------------------------------------------------------------------------------------------    Component Value Date/Time   BNP 78.8 04/10/2015 1726    Micro Results Recent Results (from the past 240 hour(s))  Culture, blood (Routine x 2)     Status: None (Preliminary result)   Collection Time: 03/19/18 12:23 PM  Result Value Ref Range Status   Specimen Description BLOOD RIGHT ANTECUBITAL  Final   Special Requests   Final    BOTTLES DRAWN AEROBIC AND ANAEROBIC Blood Culture adequate volume   Culture   Final    NO GROWTH < 24 HOURS Performed at [facility], [address] [place], [place] [address]    Report Status PENDING  Incomplete  Culture, blood (Routine x 2)     Status: None (Preliminary result)   Collection Time: 03/19/18  1:20 PM  Result Value Ref Range Status   Specimen Description BLOOD LEFT HAND  Final   Special  Requests   Final    BOTTLES DRAWN AEROBIC AND ANAEROBIC Blood Culture adequate volume   Culture   Final    NO GROWTH < 24 HOURS Performed at [facility], [place] [address] [place], [place] [address]    Report Status PENDING  Incomplete    Radiology Reports Dg Chest 2 View  Result Date: 03/19/2018 CLINICAL DATA:  Fever.  Sarcoidosis. EXAM: CHEST - 2 VIEW COMPARISON:  Chest x-rays dated 03/05/2018 and 04/30/2017 and chest CT dated 11/03/2016 FINDINGS: Heart size and pulmonary vascularity are normal. Extensive chronic changes in the right lung and to a lesser degree at the left lung base as demonstrated on prior chest x-rays and chest CT, consistent with the patient's history of sarcoidosis. No acute abnormalities. No effusions.  No bone abnormality. IMPRESSION: 1. Severe chronic lung disease. 2. No acute abnormalities. Electronically Signed   By: Lorriane Shire M.D.   On: 03/19/2018 13:30   Dg Chest 2 View  Result Date: 03/05/2018 CLINICAL DATA:  Per EMS, pt from home, reports L foot pain, hx of gout. She also endorses SOB today, with expiratory  wheezing in the L lung. Hx of sarcoidosis PT HX: DM, asthma, non smoker EXAM: CHEST - 2 VIEW COMPARISON:  04/30/2017 and older exams. FINDINGS: Extensive right lung fibrosis with volume loss. Milder fibrosis at the left lung base. These findings are stable. No acute changes in the lungs. No pleural effusion or pneumothorax. Cardiac silhouette is normal in size. No mediastinal or hilar masses. No convincing adenopathy. Skeletal structures are intact. IMPRESSION: 1. No acute cardiopulmonary disease. 2. Stable pulmonary fibrosis, most evident in the right lung. Electronically Signed   By: Lajean Manes M.D.   On: 03/05/2018 20:39    Time Spent in minutes  30   Lala Lund M.D on 03/20/2018 at 10:34 AM  To page go to [email or web address] - password [facility]

## 2018-03-20 NOTE — Progress Notes (Signed)
SATURATION QUALIFICATIONS: (This note is used to comply with regulatory documentation for home oxygen)  Patient Saturations on Room Air at Rest = 98%  Patient Saturations on Room Air while Ambulating = 93%  Patient Saturations on 2 Liters of oxygen while Ambulating = 99%

## 2018-03-20 NOTE — Progress Notes (Signed)
PHARMACY - PHYSICIAN COMMUNICATION CRITICAL VALUE ALERT - BLOOD CULTURE IDENTIFICATION (BCID)  Darlene Stafford is an 68 y.o. female who presented to [facility] on 03/19/2018 with a chief complaint of fall and fever  Assessment:  Blood cultures are 1 out of 2 with GPC. BCID showing staph species (mec A +) so likely CoNS. Urine culture still pending with GNR.   Name of physician (or Provider) Contacted: Dr. Candiss Norse  Current antibiotics: Cefepime (Vancomycin has been discontinued)  Changes to prescribed antibiotics recommended:  Continue Cefepime for now until Urine culture results.   Results for orders placed or performed during the hospital encounter of 03/19/18  Blood Culture ID Panel (Reflexed) (Collected: 03/19/2018 12:23 PM)  Result Value Ref Range   Enterococcus species NOT DETECTED NOT DETECTED   Listeria monocytogenes NOT DETECTED NOT DETECTED   Staphylococcus species DETECTED (A) NOT DETECTED   Staphylococcus aureus (BCID) NOT DETECTED NOT DETECTED   Methicillin resistance DETECTED (A) NOT DETECTED   Streptococcus species NOT DETECTED NOT DETECTED   Streptococcus agalactiae NOT DETECTED NOT DETECTED   Streptococcus pneumoniae NOT DETECTED NOT DETECTED   Streptococcus pyogenes NOT DETECTED NOT DETECTED   Acinetobacter baumannii NOT DETECTED NOT DETECTED   Enterobacteriaceae species NOT DETECTED NOT DETECTED   Enterobacter cloacae complex NOT DETECTED NOT DETECTED   Escherichia coli NOT DETECTED NOT DETECTED   Klebsiella oxytoca NOT DETECTED NOT DETECTED   Klebsiella pneumoniae NOT DETECTED NOT DETECTED   Proteus species NOT DETECTED NOT DETECTED   Serratia marcescens NOT DETECTED NOT DETECTED   Haemophilus influenzae NOT DETECTED NOT DETECTED   Neisseria meningitidis NOT DETECTED NOT DETECTED   Pseudomonas aeruginosa NOT DETECTED NOT DETECTED   Candida albicans NOT DETECTED NOT DETECTED   Candida glabrata NOT DETECTED NOT DETECTED   Candida krusei NOT DETECTED NOT DETECTED   Candida parapsilosis NOT DETECTED NOT DETECTED   Candida tropicalis NOT DETECTED NOT DETECTED    Sharday, Michl 03/20/2018  12:55 PM

## 2018-03-20 NOTE — Progress Notes (Signed)
  Echocardiogram 2D Echocardiogram has been performed.  Darlene Stafford 03/20/2018, 8:58 AM

## 2018-03-21 LAB — CBC
HCT: 25.6 % — ABNORMAL LOW (ref 36.0–46.0)
Hemoglobin: 8.1 g/dL — ABNORMAL LOW (ref 12.0–15.0)
MCH: 27.5 pg (ref 26.0–34.0)
MCHC: 31.6 g/dL (ref 30.0–36.0)
MCV: 86.8 fL (ref 80.0–100.0)
PLATELETS: 332 10*3/uL (ref 150–400)
RBC: 2.95 MIL/uL — ABNORMAL LOW (ref 3.87–5.11)
RDW: 12.7 % (ref 11.5–15.5)
WBC: 12.2 10*3/uL — ABNORMAL HIGH (ref 4.0–10.5)
nRBC: 0 % (ref 0.0–0.2)

## 2018-03-21 LAB — BASIC METABOLIC PANEL
Anion gap: 17 — ABNORMAL HIGH (ref 5–15)
BUN: 18 mg/dL (ref 8–23)
CO2: 21 mmol/L — ABNORMAL LOW (ref 22–32)
Calcium: 8.2 mg/dL — ABNORMAL LOW (ref 8.9–10.3)
Chloride: 95 mmol/L — ABNORMAL LOW (ref 98–111)
Creatinine, Ser: 1.55 mg/dL — ABNORMAL HIGH (ref 0.44–1.00)
GFR calc Af Amer: 40 mL/min — ABNORMAL LOW (ref >60)
GFR, EST NON AFRICAN AMERICAN: 34 mL/min — AB (ref >60)
Glucose, Bld: 245 mg/dL — ABNORMAL HIGH (ref 70–99)
Potassium: 3.5 mmol/L (ref 3.5–5.1)
Sodium: 133 mmol/L — ABNORMAL LOW (ref 135–145)

## 2018-03-21 LAB — CULTURE, BLOOD (ROUTINE X 2): Special Requests: ADEQUATE

## 2018-03-21 LAB — URINE CULTURE

## 2018-03-21 LAB — HIV ANTIBODY (ROUTINE TESTING W REFLEX): HIV Screen 4th Generation wRfx: NONREACTIVE

## 2018-03-21 MED ORDER — SODIUM CHLORIDE 0.9 % IV SOLN
2.0000 g | INTRAVENOUS | Status: DC
  Administered 2018-03-21 – 2018-03-22 (×2): 2 g via INTRAVENOUS
  Filled 2018-03-21 (×2): qty 20

## 2018-03-21 MED ORDER — PREDNISONE 20 MG PO TABS
20.0000 mg | ORAL_TABLET | Freq: Every day | ORAL | Status: DC
  Administered 2018-03-22: 20 mg via ORAL
  Filled 2018-03-21: qty 1

## 2018-03-21 NOTE — Progress Notes (Signed)
PROGRESS NOTE                                                                                                                                                                                                             Patient Demographics:    Darlene Stafford, is a 68 y.o. female, DOB - [birth year], RXV:[id number]  Admit date - 03/19/2018   Admitting Physician Evalee Mutton Kristeen Mans, MD  Outpatient Primary MD for the patient is Briscoe, Jannifer Rodney, MD  LOS - 2  Chief Complaint  Patient presents with  . Fall  . Fever       Brief Narrative Darlene Stafford is a 68 y.o. female with medical history significant of sarcoidosis on chronic prednisone, asthma, CKD stage III, gout, prior history of CVA without any focal deficits presenting to the hospital for the above-noted complaints.  Per patient for the past 3 days-she has had worsening generalized weakness, subjective fever (never measured her temp at home) with associated chills and rigors   Subjective:   Patient in bed, appears comfortable, denies any headache, no fever, no chest pain or pressure, no shortness of breath , no abdominal pain. No focal weakness.     Assessment  & Plan :   Sepsis caused by E. coli bacteremia and UTI: Responded very well to IV antibiotics, will transition her to 2 g of IV Rocephin daily, complete total 10-day course, sepsis pathophysiology has defervesced, advance activity, PT.  May require SNF.  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  Acute gouty arthritis: Claims to have pain in her numerous toes of her bilateral foot-she claims it is very similar to prior gouty flares.  She is on chronic prednisone for sarcoidosis-she was given 1 dose of IV Solu-Medrol in the ER which resolution of her symptoms, currently on 40 of prednisone will gradually taper to her home dose.  Asthma: No evidence of flare-we will place on  bronchodilators.  Sarcoidosis: Appears to have fairly advanced sarcoidosis-has extensive scarring in her chest x-ray-maintained on prednisone chronically.  Follows with Dr. Halford Chessman in the outpatient setting.  Morbid obesity.  Follow with PCP for weight loss, also has underlying deconditioning for which PT has been initiated and will require SNF.     Family Communication  : None  Code Status :  Full  Disposition Plan  :  SNF  Consults  :  None  Procedures  :  TTE -   DVT Prophylaxis  :  Lovenox   Lab Results  Component Value Date   PLT 332 03/21/2018    Diet :  Diet Order            Diet Heart Room service appropriate? Yes; Fluid consistency: Thin  Diet effective now               Inpatient Medications Scheduled Meds: . albuterol  2.5 mg Nebulization BID  . arformoterol  15 mcg Nebulization BID  . atorvastatin  40 mg Oral Daily  . budesonide (PULMICORT) nebulizer solution  0.25 mg Nebulization BID  . enoxaparin (LOVENOX) injection  40 mg Subcutaneous Q24H  . fluticasone  1 spray Each Nare Daily  . guaiFENesin  600 mg Oral BID  . montelukast  10 mg Oral QHS  . nystatin   Topical TID  . pantoprazole  40 mg Oral Daily  . predniSONE  40 mg Oral Q breakfast   Continuous Infusions: . ceFEPime (MAXIPIME) IV Stopped (03/20/18 1627)   PRN Meds:.acetaminophen **OR** [DISCONTINUED] acetaminophen, albuterol, hydrALAZINE, ondansetron **OR** ondansetron (ZOFRAN) IV  Antibiotics  :   Anti-infectives (From admission, onward)   Start     Dose/Rate Route Frequency Ordered Stop   03/20/18 1400  vancomycin (VANCOCIN) 1,250 mg in sodium chloride 0.9 % 250 mL IVPB  Status:  Discontinued     1,250 mg 166.7 mL/hr over 90 Minutes Intravenous Every 24 hours 03/19/18 1416 03/20/18 1027   03/20/18 1300  ceFEPIme (MAXIPIME) 2 g in sodium chloride 0.9 % 100 mL IVPB     2 g 200 mL/hr over 30 Minutes Intravenous Every 24 hours 03/19/18 1252     03/19/18 1300  vancomycin (VANCOCIN)  2,000 mg in sodium chloride 0.9 % 500 mL IVPB     2,000 mg 250 mL/hr over 120 Minutes Intravenous  Once 03/19/18 1252 03/19/18 1905   03/19/18 1215  vancomycin (VANCOCIN) IVPB 1000 mg/200 mL premix  Status:  Discontinued     1,000 mg 200 mL/hr over 60 Minutes Intravenous  Once 03/19/18 1214 03/19/18 1250   03/19/18 1215  ceFEPIme (MAXIPIME) 2 g in sodium chloride 0.9 % 100 mL IVPB     2 g 200 mL/hr over 30 Minutes Intravenous  Once 03/19/18 1214 03/19/18 1441          Objective:   Vitals:   03/20/18 1502 03/20/18 2224 03/21/18 0526 03/21/18 0848  BP: 133/78 137/68 (!) 117/46   Pulse: 86 83 76   Resp: 20 18 20    Temp: 97.9 F (36.6 C) 98.8 F (37.1 C) 98.2 F (36.8 C)   TempSrc: Oral Oral Oral   SpO2: 99% 95% 95% 95%  Weight:      Height:        Wt Readings from Last 3 Encounters:  03/19/18 113 kg  03/17/18 113.4 kg  03/05/18 113.4 kg     Intake/Output Summary (Last 24 hours) at 03/21/2018 0951 Last data filed at 03/21/2018 0655 Gross per 24 hour  Intake 50 ml  Output 200 ml  Net -150 ml     Physical Exam  Awake Alert, Oriented X 3, No new F.N deficits, Normal affect [place].AT,PERRAL Supple Neck,No JVD, No cervical lymphadenopathy appriciated.  Symmetrical Chest wall movement, Good air movement bilaterally, CTAB RRR,No Gallops, Rubs or new Murmurs, No Parasternal Heave +ve B.Sounds, Abd Soft, No tenderness, No organomegaly appriciated, No rebound - guarding or rigidity. No Cyanosis, Clubbing or edema, No new Rash or bruise  Data Review:    CBC Recent Labs  Lab 03/19/18 1205 03/20/18 0536 03/21/18 0908  WBC 11.4* 8.6 12.2*  HGB 11.1* 8.9* 8.1*  HCT 34.5* 28.0* 25.6*  PLT 405* 308 332  MCV 88.0 87.2 86.8  MCH 28.3 27.7 27.5  MCHC 32.2 31.8 31.6  RDW 12.5 12.4 12.7  LYMPHSABS 2.0  --   --   MONOABS 1.6*  --   --   EOSABS 0.3  --   --   BASOSABS 0.1  --   --     Chemistries  Recent Labs  Lab 03/19/18 1205 03/20/18 0536  NA 132* 134*  K 3.6  3.8  CL 90* 94*  CO2 26 24  GLUCOSE 142* 153*  BUN 13 17  CREATININE 1.94* 1.89*  CALCIUM 8.3* 7.7*  AST 29  --   ALT 13  --   ALKPHOS 81  --   BILITOT 4.3*  --    ------------------------------------------------------------------------------------------------------------------ No results for input(s): CHOL, HDL, LDLCALC, TRIG, CHOLHDL, LDLDIRECT in the last 72 hours.  Lab Results  Component Value Date   HGBA1C 5.8 (H) 04/11/2015   ------------------------------------------------------------------------------------------------------------------ No results for input(s): TSH, T4TOTAL, T3FREE, THYROIDAB in the last 72 hours.  Invalid input(s): FREET3 ------------------------------------------------------------------------------------------------------------------ No results for input(s): VITAMINB12, FOLATE, FERRITIN, TIBC, IRON, RETICCTPCT in the last 72 hours.  Coagulation profile No results for input(s): INR, PROTIME in the last 168 hours.  No results for input(s): DDIMER in the last 72 hours.  Cardiac Enzymes No results for input(s): CKMB, TROPONINI, MYOGLOBIN in the last 168 hours.  Invalid input(s): CK ------------------------------------------------------------------------------------------------------------------    Component Value Date/Time   BNP 78.8 04/10/2015 1726    Micro Results Recent Results (from the past 240 hour(s))  Culture, blood (Routine x 2)     Status: Abnormal   Collection Time: 03/19/18 12:23 PM  Result Value Ref Range Status   Specimen Description BLOOD RIGHT ANTECUBITAL  Final   Special Requests   Final    BOTTLES DRAWN AEROBIC AND ANAEROBIC Blood Culture adequate volume   Culture  Setup Time   Final    GRAM POSITIVE COCCI AEROBIC BOTTLE ONLY Organism ID to follow CRITICAL RESULT CALLED TO, READ BACK BY AND VERIFIED WITH: PHARMD J MILLEN [id number] MLM    Culture (A)  Final    STAPHYLOCOCCUS SPECIES (COAGULASE NEGATIVE) THE  SIGNIFICANCE OF ISOLATING THIS ORGANISM FROM A SINGLE SET OF BLOOD CULTURES WHEN MULTIPLE SETS ARE DRAWN IS UNCERTAIN. PLEASE NOTIFY THE MICROBIOLOGY DEPARTMENT WITHIN ONE WEEK IF SPECIATION AND SENSITIVITIES ARE REQUIRED. Performed at [facility], [place] [address] [place], [place] [address]    Report Status 03/21/2018 FINAL  Final  Blood Culture ID Panel (Reflexed)     Status: Abnormal   Collection Time: 03/19/18 12:23 PM  Result Value Ref Range Status   Enterococcus species NOT DETECTED NOT DETECTED Final   Listeria monocytogenes NOT DETECTED NOT DETECTED Final   Staphylococcus species DETECTED (A) NOT DETECTED Final    Comment: Methicillin (oxacillin) resistant coagulase negative staphylococcus. Possible blood culture contaminant (unless isolated from more than one blood culture draw or clinical case suggests pathogenicity). No antibiotic treatment is indicated for blood  culture contaminants. CRITICAL RESULT CALLED TO, READ BACK BY AND VERIFIED WITH: PHARMD J MILLEN [id number] MLM    Staphylococcus aureus (BCID) NOT DETECTED NOT DETECTED Final   Methicillin resistance DETECTED (A) NOT DETECTED Final    Comment: CRITICAL RESULT CALLED TO, READ BACK BY AND VERIFIED WITH:  PHARMD J MILLEN [id number] MLM    Streptococcus species NOT DETECTED NOT DETECTED Final   Streptococcus agalactiae NOT DETECTED NOT DETECTED Final   Streptococcus pneumoniae NOT DETECTED NOT DETECTED Final   Streptococcus pyogenes NOT DETECTED NOT DETECTED Final   Acinetobacter baumannii NOT DETECTED NOT DETECTED Final   Enterobacteriaceae species NOT DETECTED NOT DETECTED Final   Enterobacter cloacae complex NOT DETECTED NOT DETECTED Final   Escherichia coli NOT DETECTED NOT DETECTED Final   Klebsiella oxytoca NOT DETECTED NOT DETECTED Final   Klebsiella pneumoniae NOT DETECTED NOT DETECTED Final   Proteus species NOT DETECTED NOT DETECTED Final   Serratia marcescens NOT DETECTED NOT DETECTED Final    Haemophilus influenzae NOT DETECTED NOT DETECTED Final   Neisseria meningitidis NOT DETECTED NOT DETECTED Final   Pseudomonas aeruginosa NOT DETECTED NOT DETECTED Final   Candida albicans NOT DETECTED NOT DETECTED Final   Candida glabrata NOT DETECTED NOT DETECTED Final   Candida krusei NOT DETECTED NOT DETECTED Final   Candida parapsilosis NOT DETECTED NOT DETECTED Final   Candida tropicalis NOT DETECTED NOT DETECTED Final    Comment: Performed at [facility], [place] [address] [place], [place] [address]  Culture, blood (Routine x 2)     Status: None (Preliminary result)   Collection Time: 03/19/18  1:20 PM  Result Value Ref Range Status   Specimen Description BLOOD LEFT HAND  Final   Special Requests   Final    BOTTLES DRAWN AEROBIC AND ANAEROBIC Blood Culture adequate volume   Culture   Final    NO GROWTH 2 DAYS Performed at [facility], [place] [address] [place], [place] [address]    Report Status PENDING  Incomplete  Urine culture     Status: Abnormal   Collection Time: 03/19/18  3:19 PM  Result Value Ref Range Status   Specimen Description URINE, CATHETERIZED  Final   Special Requests   Final    NONE Performed at [facility], [address], [place], [place] [address]    Culture >=100,000 COLONIES/mL ESCHERICHIA COLI (A)  Final   Report Status 03/21/2018 FINAL  Final   Organism ID, Bacteria ESCHERICHIA COLI (A)  Final      Susceptibility   Escherichia coli - MIC*    AMPICILLIN 8 SENSITIVE Sensitive     CEFAZOLIN <=4 SENSITIVE Sensitive     CEFTRIAXONE <=1 SENSITIVE Sensitive     CIPROFLOXACIN >=4 RESISTANT Resistant     GENTAMICIN <=1 SENSITIVE Sensitive     IMIPENEM <=0.25 SENSITIVE Sensitive     NITROFURANTOIN <=16 SENSITIVE Sensitive     TRIMETH/SULFA <=20 SENSITIVE Sensitive     AMPICILLIN/SULBACTAM 4 SENSITIVE Sensitive     PIP/TAZO <=4 SENSITIVE Sensitive     Extended ESBL NEGATIVE Sensitive     * >=100,000 COLONIES/mL ESCHERICHIA COLI     Radiology Reports Dg Chest 2 View  Result Date: 03/19/2018 CLINICAL DATA:  Fever.  Sarcoidosis. EXAM: CHEST - 2 VIEW COMPARISON:  Chest x-rays dated 03/05/2018 and 04/30/2017 and chest CT dated 11/03/2016 FINDINGS: Heart size and pulmonary vascularity are normal. Extensive chronic changes in the right lung and to a lesser degree at the left lung base as demonstrated on prior chest x-rays and chest CT, consistent with the patient's history of sarcoidosis. No acute abnormalities. No effusions.  No bone abnormality. IMPRESSION: 1. Severe chronic lung disease. 2. No acute abnormalities. Electronically Signed   By: Lorriane Shire M.D.   On: 03/19/2018 13:30  Dg Chest 2 View  Result Date: 03/05/2018 CLINICAL DATA:  Per EMS, pt from home, reports L foot pain, hx of gout. She also endorses SOB today, with expiratory wheezing in the L lung. Hx of sarcoidosis PT HX: DM, asthma, non smoker EXAM: CHEST - 2 VIEW COMPARISON:  04/30/2017 and older exams. FINDINGS: Extensive right lung fibrosis with volume loss. Milder fibrosis at the left lung base. These findings are stable. No acute changes in the lungs. No pleural effusion or pneumothorax. Cardiac silhouette is normal in size. No mediastinal or hilar masses. No convincing adenopathy. Skeletal structures are intact. IMPRESSION: 1. No acute cardiopulmonary disease. 2. Stable pulmonary fibrosis, most evident in the right lung. Electronically Signed   By: Lajean Manes M.D.   On: 03/05/2018 20:39    Time Spent in minutes  30   Lala Lund M.D on 03/21/2018 at 9:51 AM  To page go to [email or web address] - password [facility]

## 2018-03-22 DIAGNOSIS — R6520 Severe sepsis without septic shock: Secondary | ICD-10-CM

## 2018-03-22 DIAGNOSIS — N390 Urinary tract infection, site not specified: Secondary | ICD-10-CM

## 2018-03-22 MED ORDER — POTASSIUM CHLORIDE CRYS ER 20 MEQ PO TBCR
40.0000 meq | EXTENDED_RELEASE_TABLET | Freq: Once | ORAL | Status: AC
  Administered 2018-03-22: 40 meq via ORAL
  Filled 2018-03-22: qty 2

## 2018-03-22 MED ORDER — CEPHALEXIN 500 MG PO CAPS: 500.0000 mg | ORAL_CAPSULE | Freq: Three times a day (TID) | ORAL | 0 refills | Status: AC

## 2018-03-22 NOTE — Progress Notes (Addendum)
CSW received consult regarding PT recommendation of SNF at discharge.  Patient is refusing SNF and will return home with home health. RNCM aware.   CSW also provided patient with a Medicaid application.  CSW signing off.   Percell Locus Chelsie Burel LCSW [phone]

## 2018-03-22 NOTE — Consult Note (Signed)
WOC assistance requested for use of Interdry.  Pt has partial thickness fissures to bilat breast folds which are red, painful and have small amt bleeding. Appearance is consistent with intertrigo.  Interdry silver-impregnated fabric ordered for use by bedside nurses and instructions provided.  This product should remain in place for 5 days for optimal plan of care to provide antimicrobial benefits and wick moisture away from skin.  Discussed plan of care with patient. Please re-consult if further assistance is needed.  Thank-you,  Julien Girt MSN, [place], [place], [place], [place]

## 2018-03-22 NOTE — Progress Notes (Signed)
Darlene Stafford discharged Home with home health per MD order.  Discharge instructions reviewed and discussed with the patient, all questions and concerns answered. Copy of instructions, care notes for new diagnosis and medication and scripts given to patient.  Allergies as of 03/22/2018      Reactions   Fish Allergy Anaphylaxis   Shellfish Allergy Anaphylaxis   Sulfa Antibiotics Hives, Itching      Medication List    TAKE these medications   albuterol (2.5 MG/3ML) 0.083% nebulizer solution Commonly known as:  PROVENTIL Take 3 mLs (2.5 mg total) by nebulization every 6 (six) hours as needed for wheezing or shortness of breath.   albuterol 108 (90 Base) MCG/ACT inhaler Commonly known as:  PROVENTIL HFA;VENTOLIN HFA Inhale 1-2 puffs into the lungs every 4 (four) hours as needed for shortness of breath.   atorvastatin 40 MG tablet Commonly known as:  LIPITOR Take 40 mg by mouth daily.   carvedilol 12.5 MG tablet Commonly known as:  COREG Take 12.5 mg by mouth 2 (two) times daily with a meal.   cephALEXin 500 MG capsule Commonly known as:  KEFLEX Take 1 capsule (500 mg total) by mouth 3 (three) times daily for 7 days.   dextromethorphan-guaiFENesin 30-600 MG 12hr tablet Commonly known as:  MUCINEX DM Take 1 tablet by mouth 2 (two) times daily as needed (sinus infection).   guaiFENesin 600 MG 12 hr tablet Commonly known as:  MUCINEX Take 1 tablet (600 mg total) by mouth 2 (two) times daily. What changed:    when to take this  reasons to take this   montelukast 10 MG tablet Commonly known as:  SINGULAIR Take 1 tablet (10 mg total) by mouth at bedtime.   omeprazole 20 MG capsule Commonly known as:  PRILOSEC Take 20 mg by mouth daily.   predniSONE 2.5 MG tablet Commonly known as:  DELTASONE Take 1 tablet (2.5 mg total) by mouth daily with breakfast.            Durable Medical Equipment  (From admission, onward)         Start     Ordered   03/22/18 1137  For  home use only DME 3 n 1  Once     03/22/18 1137           IV site discontinued and catheter remains intact. Site without signs and symptoms of complications. Dressing and pressure applied.  Patient escorted to car by NT in a wheelchair,  no distress noted upon discharge.  Darlene Stafford, Darlene Stafford 03/22/2018 12:58 PM

## 2018-03-22 NOTE — Discharge Instructions (Signed)
Follow with Primary MD Katherina Mires, MD in 7 days   Get CBC, CMP, 2 view Chest X ray -  checked  by Primary MD  in 5-7 days   Activity: As tolerated with Full fall precautions use walker/cane & assistance as needed  Disposition Home    Diet: Heart Healthy    Special Instructions: If you have smoked or chewed Tobacco  in the last 2 yrs please stop smoking, stop any regular Alcohol  and or any Recreational drug use.  On your next visit with your primary care physician please Get Medicines reviewed and adjusted.  Please request your Prim.MD to go over all Hospital Tests and Procedure/Radiological results at the follow up, please get all Hospital records sent to your Prim MD by signing hospital release before you go home.  If you experience worsening of your admission symptoms, develop shortness of breath, life threatening emergency, suicidal or homicidal thoughts you must seek medical attention immediately by calling 911 or calling your MD immediately  if symptoms less severe.  You Must read complete instructions/literature along with all the possible adverse reactions/side effects for all the Medicines you take and that have been prescribed to you. Take any new Medicines after you have completely understood and accpet all the possible adverse reactions/side effects.   Do not drive, operate heavy machinery, perform activities at heights, swimming or participation in water activities or provide baby sitting services if your were admitted for syncope or siezures until you have seen by Primary MD or a Neurologist and advised to do so again.  Do not drive when taking Pain medications.  Do not take more than prescribed Pain, Sleep and Anxiety Medications  Wear Seat belts while driving.

## 2018-03-22 NOTE — Discharge Summary (Signed)
Darlene Stafford FXT:[id number] DOB: 1950/03/01 DOA: 03/19/2018  PCP: Katherina Mires, MD  Admit date: 03/19/2018  Discharge date: 03/22/2018  Admitted From: Home  Disposition:  Home   Recommendations for Outpatient Follow-up:   Follow up with PCP in 1-2 weeks  PCP Please obtain BMP/CBC, 2 view CXR in 1week,  (see Discharge instructions)   PCP Please follow up on the following pending results:    Home Health: PT,RN,Aide   Equipment/Devices: None  Consultations: None Discharge Condition: Stable   CODE STATUS: Full   Diet Recommendation: Heart Healthy     Chief Complaint  Patient presents with  . Fall  . Fever     Brief history of present illness from the day of admission and additional interim summary    Darlene Stafford a 68 y.o.femalewith medical history significant ofsarcoidosis on chronic prednisone, asthma, CKD stage III, gout, prior history of CVA without any focal deficits presenting to the hospital for the above-noted complaints. Per patient for the past 3 days-she has had worsening generalized weakness, subjective fever (never measured her temp at home) with associated chills and rigors.                                                                 Hospital Course   Sepsis caused by E. coli bacteremia and UTI: Responded very well to IV Rocephin, was hydrated, Sepsis has resolved, will get 1 more week of PO Keflex with PCP follow up.  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  Acute gouty arthritis: mild flare, resolved with low dose steroids, will continue her chronic dose upon DC.  Asthma: No evidence of flare-we will place on bronchodilators.  Sarcoidosis: Appears to have fairly advanced sarcoidosis-has  extensive scarring in her chest x-ray-maintained on prednisone chronically.  Follows with Dr. Halford Chessman in the outpatient setting.  Morbid obesity.  Follow with PCP for weight loss, also has underlying deconditioning for which PT has been initiated, she is able to ambulate with her home walker without much assistance, prefers to go home with home PT will be discharged with home PT.  Discussed with son.  Agrees with the plan.     Discharge diagnosis     Principal Problem:   Sepsis ([place]) Active Problems:   Asthma   AKI (acute kidney injury) ([place])   Morbid obesity due to excess calories ([place])   Sarcoidosis   CKD (chronic kidney disease), stage III [facility])    Discharge instructions    Discharge Instructions    Diet - low sodium heart healthy   Complete by:  As directed    Discharge instructions   Complete by:  As directed    Follow with Primary MD Katherina Mires, MD  in 7 days   Get CBC, CMP, 2 view Chest X ray -  checked  by Primary MD  in 5-7 days   Activity: As tolerated with Full fall precautions use walker/cane & assistance as needed  Disposition Home    Diet: Heart Healthy    Special Instructions: If you have smoked or chewed Tobacco  in the last 2 yrs please stop smoking, stop any regular Alcohol  and or any Recreational drug use.  On your next visit with your primary care physician please Get Medicines reviewed and adjusted.  Please request your Prim.MD to go over all Hospital Tests and Procedure/Radiological results at the follow up, please get all Hospital records sent to your Prim MD by signing hospital release before you go home.  If you experience worsening of your admission symptoms, develop shortness of breath, life threatening emergency, suicidal or homicidal thoughts you must seek medical attention immediately by calling 911 or calling your MD immediately  if symptoms less severe.  You Must read complete instructions/literature along with all the possible adverse  reactions/side effects for all the Medicines you take and that have been prescribed to you. Take any new Medicines after you have completely understood and accpet all the possible adverse reactions/side effects.   Do not drive, operate heavy machinery, perform activities at heights, swimming or participation in water activities or provide baby sitting services if your were admitted for syncope or siezures until you have seen by Primary MD or a Neurologist and advised to do so again.  Do not drive when taking Pain medications.  Do not take more than prescribed Pain, Sleep and Anxiety Medications  Wear Seat belts while driving.   Increase activity slowly   Complete by:  As directed       Discharge Medications   Allergies as of 03/22/2018      Reactions   Fish Allergy Anaphylaxis   Shellfish Allergy Anaphylaxis   Sulfa Antibiotics Hives, Itching      Medication List    TAKE these medications   albuterol (2.5 MG/3ML) 0.083% nebulizer solution Commonly known as:  PROVENTIL Take 3 mLs (2.5 mg total) by nebulization every 6 (six) hours as needed for wheezing or shortness of breath.   albuterol 108 (90 Base) MCG/ACT inhaler Commonly known as:  PROVENTIL HFA;VENTOLIN HFA Inhale 1-2 puffs into the lungs every 4 (four) hours as needed for shortness of breath.   atorvastatin 40 MG tablet Commonly known as:  LIPITOR Take 40 mg by mouth daily.   carvedilol 12.5 MG tablet Commonly known as:  COREG Take 12.5 mg by mouth 2 (two) times daily with a meal.   cephALEXin 500 MG capsule Commonly known as:  KEFLEX Take 1 capsule (500 mg total) by mouth 3 (three) times daily for 7 days.   dextromethorphan-guaiFENesin 30-600 MG 12hr tablet Commonly known as:  MUCINEX DM Take 1 tablet by mouth 2 (two) times daily as needed (sinus infection).   guaiFENesin 600 MG 12 hr tablet Commonly known as:  MUCINEX Take 1 tablet (600 mg total) by mouth 2 (two) times daily. What changed:    when to take  this  reasons to take this   montelukast 10 MG tablet Commonly known as:  SINGULAIR Take 1 tablet (10 mg total) by mouth at bedtime.   omeprazole 20 MG capsule Commonly known as:  PRILOSEC Take 20 mg by mouth daily.   predniSONE 2.5 MG tablet Commonly known as:  DELTASONE Take 1 tablet (2.5 mg  total) by mouth daily with breakfast.       Follow-up Information    Katherina Mires, MD. Schedule an appointment as soon as possible for a visit in 1 week(s).   Specialty:  Family Medicine Contact information: [place] [address] [phone]           Major procedures and Radiology Reports - PLEASE review detailed and final reports thoroughly  -        Dg Chest 2 View  Result Date: 03/19/2018 CLINICAL DATA:  Fever.  Sarcoidosis. EXAM: CHEST - 2 VIEW COMPARISON:  Chest x-rays dated 03/05/2018 and 04/30/2017 and chest CT dated 11/03/2016 FINDINGS: Heart size and pulmonary vascularity are normal. Extensive chronic changes in the right lung and to a lesser degree at the left lung base as demonstrated on prior chest x-rays and chest CT, consistent with the patient's history of sarcoidosis. No acute abnormalities. No effusions.  No bone abnormality. IMPRESSION: 1. Severe chronic lung disease. 2. No acute abnormalities. Electronically Signed   By: Lorriane Shire M.D.   On: 03/19/2018 13:30   Dg Chest 2 View  Result Date: 03/05/2018 CLINICAL DATA:  Per EMS, pt from home, reports L foot pain, hx of gout. She also endorses SOB today, with expiratory wheezing in the L lung. Hx of sarcoidosis PT HX: DM, asthma, non smoker EXAM: CHEST - 2 VIEW COMPARISON:  04/30/2017 and older exams. FINDINGS: Extensive right lung fibrosis with volume loss. Milder fibrosis at the left lung base. These findings are stable. No acute changes in the lungs. No pleural effusion or pneumothorax. Cardiac silhouette is normal in size. No mediastinal or hilar masses. No convincing  adenopathy. Skeletal structures are intact. IMPRESSION: 1. No acute cardiopulmonary disease. 2. Stable pulmonary fibrosis, most evident in the right lung. Electronically Signed   By: Lajean Manes M.D.   On: 03/05/2018 20:39    Micro Results    Recent Results (from the past 240 hour(s))  Culture, blood (Routine x 2)     Status: Abnormal   Collection Time: 03/19/18 12:23 PM  Result Value Ref Range Status   Specimen Description BLOOD RIGHT ANTECUBITAL  Final   Special Requests   Final    BOTTLES DRAWN AEROBIC AND ANAEROBIC Blood Culture adequate volume   Culture  Setup Time   Final    GRAM POSITIVE COCCI AEROBIC BOTTLE ONLY Organism ID to follow CRITICAL RESULT CALLED TO, READ BACK BY AND VERIFIED WITH: PHARMD J MILLEN [id number] MLM    Culture (A)  Final    STAPHYLOCOCCUS SPECIES (COAGULASE NEGATIVE) THE SIGNIFICANCE OF ISOLATING THIS ORGANISM FROM A SINGLE SET OF BLOOD CULTURES WHEN MULTIPLE SETS ARE DRAWN IS UNCERTAIN. PLEASE NOTIFY THE MICROBIOLOGY DEPARTMENT WITHIN ONE WEEK IF SPECIATION AND SENSITIVITIES ARE REQUIRED. Performed at [facility], [place] [address], [place], [place] [address]    Report Status 03/21/2018 FINAL  Final  Blood Culture ID Panel (Reflexed)     Status: Abnormal   Collection Time: 03/19/18 12:23 PM  Result Value Ref Range Status   Enterococcus species NOT DETECTED NOT DETECTED Final   Listeria monocytogenes NOT DETECTED NOT DETECTED Final   Staphylococcus species DETECTED (A) NOT DETECTED Final    Comment: Methicillin (oxacillin) resistant coagulase negative staphylococcus. Possible blood culture contaminant (unless isolated from more than one blood culture draw or clinical case suggests pathogenicity). No antibiotic treatment is indicated for blood  culture contaminants. CRITICAL RESULT CALLED TO, READ BACK BY AND  VERIFIED WITH: PHARMD J MILLEN [id number] MLM    Staphylococcus aureus (BCID) NOT DETECTED NOT DETECTED Final   Methicillin resistance  DETECTED (A) NOT DETECTED Final    Comment: CRITICAL RESULT CALLED TO, READ BACK BY AND VERIFIED WITH: PHARMD J MILLEN [id number] MLM    Streptococcus species NOT DETECTED NOT DETECTED Final   Streptococcus agalactiae NOT DETECTED NOT DETECTED Final   Streptococcus pneumoniae NOT DETECTED NOT DETECTED Final   Streptococcus pyogenes NOT DETECTED NOT DETECTED Final   Acinetobacter baumannii NOT DETECTED NOT DETECTED Final   Enterobacteriaceae species NOT DETECTED NOT DETECTED Final   Enterobacter cloacae complex NOT DETECTED NOT DETECTED Final   Escherichia coli NOT DETECTED NOT DETECTED Final   Klebsiella oxytoca NOT DETECTED NOT DETECTED Final   Klebsiella pneumoniae NOT DETECTED NOT DETECTED Final   Proteus species NOT DETECTED NOT DETECTED Final   Serratia marcescens NOT DETECTED NOT DETECTED Final   Haemophilus influenzae NOT DETECTED NOT DETECTED Final   Neisseria meningitidis NOT DETECTED NOT DETECTED Final   Pseudomonas aeruginosa NOT DETECTED NOT DETECTED Final   Candida albicans NOT DETECTED NOT DETECTED Final   Candida glabrata NOT DETECTED NOT DETECTED Final   Candida krusei NOT DETECTED NOT DETECTED Final   Candida parapsilosis NOT DETECTED NOT DETECTED Final   Candida tropicalis NOT DETECTED NOT DETECTED Final    Comment: Performed at [facility], [place]. [address], [place], [place] [address]  Culture, blood (Routine x 2)     Status: None (Preliminary result)   Collection Time: 03/19/18  1:20 PM  Result Value Ref Range Status   Specimen Description BLOOD LEFT HAND  Final   Special Requests   Final    BOTTLES DRAWN AEROBIC AND ANAEROBIC Blood Culture adequate volume   Culture   Final    NO GROWTH 2 DAYS Performed at [facility], [place] [address] [place], [place] [address]    Report Status PENDING  Incomplete  Urine culture     Status: Abnormal   Collection Time: 03/19/18  3:19 PM  Result Value Ref Range Status   Specimen Description URINE, CATHETERIZED   Final   Special Requests   Final    NONE Performed at [facility], [address] [place], [place] [address]    Culture >=100,000 COLONIES/mL ESCHERICHIA COLI (A)  Final   Report Status 03/21/2018 FINAL  Final   Organism ID, Bacteria ESCHERICHIA COLI (A)  Final      Susceptibility   Escherichia coli - MIC*    AMPICILLIN 8 SENSITIVE Sensitive     CEFAZOLIN <=4 SENSITIVE Sensitive     CEFTRIAXONE <=1 SENSITIVE Sensitive     CIPROFLOXACIN >=4 RESISTANT Resistant     GENTAMICIN <=1 SENSITIVE Sensitive     IMIPENEM <=0.25 SENSITIVE Sensitive     NITROFURANTOIN <=16 SENSITIVE Sensitive     TRIMETH/SULFA <=20 SENSITIVE Sensitive     AMPICILLIN/SULBACTAM 4 SENSITIVE Sensitive     PIP/TAZO <=4 SENSITIVE Sensitive     Extended ESBL NEGATIVE Sensitive     * >=100,000 COLONIES/mL ESCHERICHIA COLI    Today   Subjective    Darlene Stafford today has no headache,no chest abdominal pain,no new weakness tingling or numbness, feels much better wants to go home today.    Objective   Blood pressure (!) 142/72, pulse 92, temperature 98.9 F (37.2 C), temperature source Oral, resp. rate 18, height 5\' 1"  (1.549 m), weight 113 kg, SpO2 96 %.   Intake/Output  Summary (Last 24 hours) at 03/22/2018 0923 Last data filed at 03/21/2018 1502 Gross per 24 hour  Intake 100 ml  Output -  Net 100 ml    Exam Awake Alert, Oriented x 3, No new F.N deficits, Normal affect Universal.AT,PERRAL Supple Neck,No JVD, No cervical lymphadenopathy appriciated.  Symmetrical Chest wall movement, Good air movement bilaterally, CTAB RRR,No Gallops,Rubs or new Murmurs, No Parasternal Heave +ve B.Sounds, Abd Soft, Non tender, No organomegaly appriciated, No rebound -guarding or rigidity. No Cyanosis, Clubbing or edema, No new Rash or bruise   Data Review   CBC w Diff:  Lab Results  Component Value Date   WBC 12.2 (H) 03/21/2018   HGB 8.1 (L) 03/21/2018   HCT 25.6 (L) 03/21/2018   PLT 332 03/21/2018   LYMPHOPCT 17  03/19/2018   MONOPCT 14 03/19/2018   EOSPCT 2 03/19/2018   BASOPCT 1 03/19/2018    CMP:  Lab Results  Component Value Date   NA 133 (L) 03/21/2018   K 3.5 03/21/2018   CL 95 (L) 03/21/2018   CO2 21 (L) 03/21/2018   BUN 18 03/21/2018   CREATININE 1.55 (H) 03/21/2018   PROT 6.8 03/19/2018   ALBUMIN 3.0 (L) 03/19/2018   BILITOT 4.3 (H) 03/19/2018   ALKPHOS 81 03/19/2018   AST 29 03/19/2018   ALT 13 03/19/2018  .   Total Time in preparing paper work, data evaluation and todays exam - 69 minutes  Lala Lund M.D on 03/22/2018 at 9:23 AM  [facility]   Office  [phone]

## 2018-03-22 NOTE — Plan of Care (Signed)
  Problem: Clinical Measurements: Goal: Ability to maintain clinical measurements within normal limits will improve Outcome: Progressing   Problem: Pain Managment: Goal: General experience of comfort will improve Outcome: Progressing   

## 2018-03-22 NOTE — Care Management Important Message (Signed)
Important Message  Patient Details  Name: Darlene Stafford MRN: [id number] Date of Birth: October 10, 1950   Medicare Important Message Given:  Yes    Cleota Pellerito [address] 03/22/2018, 3:53 PM

## 2018-03-24 LAB — CULTURE, BLOOD (ROUTINE X 2)
Culture: NO GROWTH
Special Requests: ADEQUATE

## 2018-04-11 ENCOUNTER — Emergency Department (HOSPITAL_COMMUNITY): Payer: Medicare Other

## 2018-04-11 ENCOUNTER — Inpatient Hospital Stay (HOSPITAL_COMMUNITY)
Admission: EM | Admit: 2018-04-11 | Discharge: 2018-04-20 | DRG: 871 | Disposition: A | Discharge status: Skilled Nursing Facility | Payer: Medicare Other | Attending: Internal Medicine | Admitting: Internal Medicine

## 2018-04-11 DIAGNOSIS — N184 Chronic kidney disease, stage 4 (severe): Secondary | ICD-10-CM | POA: Diagnosis present

## 2018-04-11 DIAGNOSIS — E039 Hypothyroidism, unspecified: Secondary | ICD-10-CM | POA: Diagnosis present

## 2018-04-11 DIAGNOSIS — Z87440 Personal history of urinary (tract) infections: Secondary | ICD-10-CM

## 2018-04-11 DIAGNOSIS — N183 Chronic kidney disease, stage 3 unspecified: Secondary | ICD-10-CM | POA: Diagnosis present

## 2018-04-11 DIAGNOSIS — E876 Hypokalemia: Secondary | ICD-10-CM | POA: Diagnosis not present

## 2018-04-11 DIAGNOSIS — E1165 Type 2 diabetes mellitus with hyperglycemia: Secondary | ICD-10-CM | POA: Diagnosis not present

## 2018-04-11 DIAGNOSIS — Z6841 Body Mass Index (BMI) 40.0 and over, adult: Secondary | ICD-10-CM

## 2018-04-11 DIAGNOSIS — R112 Nausea with vomiting, unspecified: Secondary | ICD-10-CM

## 2018-04-11 DIAGNOSIS — R197 Diarrhea, unspecified: Secondary | ICD-10-CM | POA: Diagnosis present

## 2018-04-11 DIAGNOSIS — N200 Calculus of kidney: Secondary | ICD-10-CM | POA: Diagnosis present

## 2018-04-11 DIAGNOSIS — Z8701 Personal history of pneumonia (recurrent): Secondary | ICD-10-CM

## 2018-04-11 DIAGNOSIS — J9601 Acute respiratory failure with hypoxia: Secondary | ICD-10-CM | POA: Diagnosis not present

## 2018-04-11 DIAGNOSIS — L89312 Pressure ulcer of right buttock, stage 2: Secondary | ICD-10-CM | POA: Diagnosis present

## 2018-04-11 DIAGNOSIS — J189 Pneumonia, unspecified organism: Secondary | ICD-10-CM | POA: Diagnosis present

## 2018-04-11 DIAGNOSIS — Z79899 Other long term (current) drug therapy: Secondary | ICD-10-CM

## 2018-04-11 DIAGNOSIS — J45909 Unspecified asthma, uncomplicated: Secondary | ICD-10-CM | POA: Diagnosis present

## 2018-04-11 DIAGNOSIS — M109 Gout, unspecified: Secondary | ICD-10-CM | POA: Diagnosis present

## 2018-04-11 DIAGNOSIS — R52 Pain, unspecified: Secondary | ICD-10-CM

## 2018-04-11 DIAGNOSIS — Z91013 Allergy to seafood: Secondary | ICD-10-CM

## 2018-04-11 DIAGNOSIS — G9341 Metabolic encephalopathy: Secondary | ICD-10-CM | POA: Diagnosis present

## 2018-04-11 DIAGNOSIS — D649 Anemia, unspecified: Secondary | ICD-10-CM | POA: Diagnosis present

## 2018-04-11 DIAGNOSIS — E1122 Type 2 diabetes mellitus with diabetic chronic kidney disease: Secondary | ICD-10-CM | POA: Diagnosis present

## 2018-04-11 DIAGNOSIS — Z8673 Personal history of transient ischemic attack (TIA), and cerebral infarction without residual deficits: Secondary | ICD-10-CM

## 2018-04-11 DIAGNOSIS — E119 Type 2 diabetes mellitus without complications: Secondary | ICD-10-CM

## 2018-04-11 DIAGNOSIS — A419 Sepsis, unspecified organism: Secondary | ICD-10-CM | POA: Diagnosis not present

## 2018-04-11 DIAGNOSIS — Z96659 Presence of unspecified artificial knee joint: Secondary | ICD-10-CM | POA: Diagnosis present

## 2018-04-11 DIAGNOSIS — Z7952 Long term (current) use of systemic steroids: Secondary | ICD-10-CM

## 2018-04-11 DIAGNOSIS — Z882 Allergy status to sulfonamides status: Secondary | ICD-10-CM

## 2018-04-11 DIAGNOSIS — Z90710 Acquired absence of both cervix and uterus: Secondary | ICD-10-CM

## 2018-04-11 DIAGNOSIS — D860 Sarcoidosis of lung: Secondary | ICD-10-CM | POA: Diagnosis present

## 2018-04-11 DIAGNOSIS — L89322 Pressure ulcer of left buttock, stage 2: Secondary | ICD-10-CM | POA: Diagnosis present

## 2018-04-11 DIAGNOSIS — Z1623 Resistance to quinolones and fluoroquinolones: Secondary | ICD-10-CM | POA: Diagnosis present

## 2018-04-11 DIAGNOSIS — L8990 Pressure ulcer of unspecified site, unspecified stage: Secondary | ICD-10-CM

## 2018-04-11 DIAGNOSIS — D869 Sarcoidosis, unspecified: Secondary | ICD-10-CM | POA: Diagnosis present

## 2018-04-11 LAB — POCT I-STAT EG7
Acid-Base Excess: 4 mmol/L — ABNORMAL HIGH (ref 0.0–2.0)
Bicarbonate: 29.5 mmol/L — ABNORMAL HIGH (ref 20.0–28.0)
Calcium, Ion: 1.01 mmol/L — ABNORMAL LOW (ref 1.15–1.40)
HCT: 32 % — ABNORMAL LOW (ref 36.0–46.0)
HEMOGLOBIN: 10.9 g/dL — AB (ref 12.0–15.0)
O2 Saturation: 43 %
Potassium: 3.2 mmol/L — ABNORMAL LOW (ref 3.5–5.1)
Sodium: 136 mmol/L (ref 135–145)
TCO2: 31 mmol/L (ref 22–32)
pCO2, Ven: 50.2 mmHg (ref 44.0–60.0)
pH, Ven: 7.378 (ref 7.250–7.430)
pO2, Ven: 25 mmHg — CL (ref 32.0–45.0)

## 2018-04-11 MED ORDER — METRONIDAZOLE IN NACL 5-0.79 MG/ML-% IV SOLN
500.0000 mg | Freq: Three times a day (TID) | INTRAVENOUS | Status: DC
  Administered 2018-04-12 – 2018-04-14 (×7): 500 mg via INTRAVENOUS
  Filled 2018-04-11 (×7): qty 100

## 2018-04-11 MED ORDER — SODIUM CHLORIDE 0.9 % IV BOLUS (SEPSIS)
500.0000 mL | Freq: Once | INTRAVENOUS | Status: AC
  Administered 2018-04-12: 500 mL via INTRAVENOUS

## 2018-04-11 MED ORDER — VANCOMYCIN HCL 10 G IV SOLR
1500.0000 mg | Freq: Once | INTRAVENOUS | Status: AC
  Administered 2018-04-12: 1500 mg via INTRAVENOUS
  Filled 2018-04-11: qty 1500

## 2018-04-11 MED ORDER — VANCOMYCIN HCL IN DEXTROSE 1-5 GM/200ML-% IV SOLN: 1000.0000 mg | Freq: Once | INTRAVENOUS | Status: DC

## 2018-04-11 MED ORDER — SODIUM CHLORIDE 0.9 % IV SOLN
1000.0000 mL | INTRAVENOUS | Status: DC
  Administered 2018-04-11 – 2018-04-13 (×5): 1000 mL via INTRAVENOUS

## 2018-04-11 MED ORDER — SODIUM CHLORIDE 0.9 % IV BOLUS (SEPSIS)
1000.0000 mL | Freq: Once | INTRAVENOUS | Status: AC
  Administered 2018-04-11: 1000 mL via INTRAVENOUS

## 2018-04-11 MED ORDER — SODIUM CHLORIDE 0.9 % IV SOLN
2.0000 g | Freq: Once | INTRAVENOUS | Status: AC
  Administered 2018-04-11: 2 g via INTRAVENOUS
  Filled 2018-04-11: qty 2

## 2018-04-11 NOTE — ED Provider Notes (Signed)
[facility] EMERGENCY DEPARTMENT Provider Note  CSN: [id number] Arrival date & time: 04/11/18 2232  Chief Complaint(s) Blood Infection  HPI Darlene Stafford is a 68 y.o. female with a past medical history of CVA, sarcoidosis, diabetes who was recently admitted last month for urosepsis; cultures grew out Cipro resistant E. coli.  Patient was treated during admission and sent home on oral Keflex.  She returns today for 2 days of fevers and fatigue.  Patient also endorsing several episodes of nonbloody nonbilious emesis.  She denies any abdominal pain.  Endorses mild diarrhea.  Denies any headache, nuchal rigidity, shortness of breath, cough, congestion.  Endorses frequency but no dysuria.  HPI  Past Medical History Past Medical History:  Diagnosis Date  . Asthma   . CVA (cerebral vascular accident) ([place])   . Diabetes mellitus without complication ([place])   . Sarcoidosis of other sites    Ocular   Patient Active Problem List   Diagnosis Date Noted  . Acute bronchitis 04/30/2017  . Hemoptysis 04/30/2017  . Sarcoidosis 02/03/2017  . CKD (chronic kidney disease), stage III ([place]) 02/03/2017  . Morbid obesity due to excess calories ([place])   . CAP (community acquired pneumonia) 04/10/2015  . Diabetes mellitus without complication ([place]) 40/98/1191  . Asthma 04/10/2015  . Acute encephalopathy 04/10/2015  . UTI (lower urinary tract infection) 04/10/2015  . AKI (acute kidney injury) ([place]) 04/10/2015  . Sepsis ([place]) 04/10/2015  . Hypotension 04/10/2015   Home Medication(s) Prior to Admission medications   Medication Sig Start Date End Date Taking? Authorizing Provider  albuterol (PROVENTIL HFA;VENTOLIN HFA) 108 (90 Base) MCG/ACT inhaler Inhale 1-2 puffs into the lungs every 4 (four) hours as needed for shortness of breath. 01/13/18  Yes Chesley Mires, MD  albuterol (PROVENTIL) (2.5 MG/3ML) 0.083% nebulizer solution Take 3 mLs (2.5 mg total) by nebulization every 6 (six) hours as  needed for wheezing or shortness of breath. 04/13/17  Yes Chesley Mires, MD  atorvastatin (LIPITOR) 40 MG tablet Take 40 mg by mouth daily.   Yes [provider]  carvedilol (COREG) 12.5 MG tablet Take 12.5 mg by mouth 2 (two) times daily with a meal.   Yes [provider]  guaiFENesin (MUCINEX) 600 MG 12 hr tablet Take 1 tablet (600 mg total) by mouth 2 (two) times daily. Patient taking differently: Take 600 mg by mouth as needed for to loosen phlegm.  04/14/15  Yes Barton Dubois, MD  montelukast (SINGULAIR) 10 MG tablet Take 1 tablet (10 mg total) by mouth at bedtime. 08/22/16  Yes Chesley Mires, MD  omeprazole (PRILOSEC) 20 MG capsule Take 20 mg by mouth daily. 02/25/18  Yes [provider]  predniSONE (DELTASONE) 2.5 MG tablet Take 1 tablet (2.5 mg total) by mouth daily with breakfast. 03/17/18  Yes Chesley Mires, MD  Past Surgical History Past Surgical History:  Procedure Laterality Date  . PARTIAL HYSTERECTOMY  1998  . REPLACEMENT TOTAL KNEE  2015   Family History Family History  Problem Relation Age of Onset  . Sarcoidosis Sister   . Breast cancer Sister   . Sarcoidosis Sister   . Sarcoidosis Sister     Social History Social History   Tobacco Use  . Smoking status: Never Smoker  . Smokeless tobacco: Never Used  Substance Use Topics  . Alcohol use: No  . Drug use: No   Allergies Fish allergy; Shellfish allergy; and Sulfa antibiotics  Review of Systems Review of Systems All other systems are reviewed and are negative for acute change except as noted in the HPI  Physical Exam Vital Signs  I have reviewed the triage vital signs BP (!) 93/57 (BP Location: Right Arm)   Pulse (!) 101   Temp (!) 102.2 F (39 C) (Rectal)   Resp 18   Ht 5\' 1"  (1.549 m)   Wt 113 kg   SpO2 98%   BMI 47.07 kg/m   Physical Exam Vitals  signs reviewed.  Constitutional:      General: She is not in acute distress.    Appearance: She is well-developed. She is ill-appearing.     Comments: Obese  HENT:     Head: Normocephalic and atraumatic.     Nose: Nose normal.     Mouth/Throat:     Mouth: Mucous membranes are dry. No oral lesions or angioedema.     Dentition: No gum lesions.     Tongue: No lesions.     Tonsils: No tonsillar exudate.  Eyes:     General: No scleral icterus.       Right eye: No discharge.        Left eye: No discharge.     Conjunctiva/sclera:     Right eye: Right conjunctiva is injected.     Pupils: Pupils are equal, round, and reactive to light.  Neck:     Musculoskeletal: Normal range of motion and neck supple.  Cardiovascular:     Rate and Rhythm: Normal rate and regular rhythm.     Heart sounds: No murmur. No friction rub. No gallop.   Pulmonary:     Effort: Pulmonary effort is normal. No respiratory distress.     Breath sounds: Normal breath sounds. No stridor. No rales.  Abdominal:     General: There is no distension.     Palpations: Abdomen is soft.     Tenderness: There is no abdominal tenderness.  Musculoskeletal:        General: No tenderness.  Skin:    General: Skin is warm and dry.     Findings: No erythema or rash.  Neurological:     Mental Status: She is alert and oriented to person, place, and time.     ED Results and Treatments Labs (all labs ordered are listed, but only abnormal results are displayed) Labs Reviewed  COMPREHENSIVE METABOLIC PANEL - Abnormal; Notable for the following components:      Result Value   Potassium 3.1 (*)    Chloride 92 (*)    BUN <5 (*)    Creatinine, Ser 1.33 (*)    Calcium 8.6 (*)    Albumin 3.1 (*)    Total Bilirubin 2.4 (*)    GFR calc non Af Amer 41 (*)    GFR calc Af Amer 48 (*)    Anion gap 17 (*)  All other components within normal limits  CBC WITH DIFFERENTIAL/PLATELET - Abnormal; Notable for the following components:    RBC 3.59 (*)    Hemoglobin 10.0 (*)    HCT 33.2 (*)    Platelets 506 (*)    Abs Immature Granulocytes 0.08 (*)    All other components within normal limits  URINALYSIS, ROUTINE W REFLEX MICROSCOPIC - Abnormal; Notable for the following components:   Color, Urine AMBER (*)    Ketones, ur 20 (*)    Protein, ur 30 (*)    Bacteria, UA RARE (*)    All other components within normal limits  POCT I-STAT EG7 - Abnormal; Notable for the following components:   pO2, Ven 25.0 (*)    Bicarbonate 29.5 (*)    Acid-Base Excess 4.0 (*)    Potassium 3.2 (*)    Calcium, Ion 1.01 (*)    HCT 32.0 (*)    Hemoglobin 10.9 (*)    All other components within normal limits  CULTURE, BLOOD (ROUTINE X 2)  CULTURE, BLOOD (ROUTINE X 2)  URINE CULTURE  GROUP A STREP BY PCR  LACTIC ACID, PLASMA  LIPASE, BLOOD  PROCALCITONIN  PROTIME-INR  INFLUENZA PANEL BY PCR (TYPE A & B)  LACTIC ACID, PLASMA  BLOOD GAS, VENOUS  CBC  BASIC METABOLIC PANEL                                                                                                                         EKG  EKG Interpretation  Date/Time:  Sunday April 11 2018 23:05:59 EDT Ventricular Rate:  100 PR Interval:    QRS Duration: 133 QT Interval:  387 QTC Calculation: 500 R Axis:   -105 Text Interpretation:  Sinus tachycardia RBBB and LAFB No significant change since last tracing Confirmed by Addison Lank [id number]) on 04/11/2018 11:13:19 PM      Radiology Ct Soft Tissue Neck W Contrast  Result Date: 04/12/2018 CLINICAL DATA:  Wheezing.  History of sarcoidosis and asthma. EXAM: CT NECK WITH CONTRAST TECHNIQUE: Multidetector CT imaging of the neck was performed using the standard protocol following the bolus administration of intravenous contrast. CONTRAST:  20mL OMNIPAQUE IOHEXOL 300 MG/ML  SOLN COMPARISON:  CT chest November 03, 2016 FINDINGS: Noisy image quality due to habitus, shoulders at the level of the upper neck. Mild motion degraded  examination. PHARYNX AND LARYNX: Normal.  Widely patent airway. SALIVARY GLANDS: Normal. THYROID: Normal. LYMPH NODES: No lymphadenopathy by CT size criteria. VASCULAR: Moderate calcific atherosclerosis carotid siphon. Mild calcific atherosclerosis carotid bifurcations. LIMITED INTRACRANIAL: Normal. VISUALIZED ORBITS: Normal. MASTOIDS AND VISUALIZED PARANASAL SINUSES: Bilateral mastoid effusions without air cell coalescence. SKELETON: Nonacute.  Calcified stylohyoid ligaments. UPPER CHEST: Small RIGHT pleural effusion with dense consolidation with some component of bronchiectasis. Calcified mediastinal lymphadenopathy. OTHER: Trace retropharyngeal effusion versus artifact. IMPRESSION: 1. Habitus limited examination. Trace retropharyngeal effusion versus artifact. Patent airway. 2. RIGHT pleural effusion with consolidation and bronchiectasis, incompletely evaluated. Calcified mediastinal lymphadenopathy. Electronically Signed  By: Elon Alas M.D.   On: 04/12/2018 02:43   Ct Chest W Contrast  Result Date: 04/12/2018 CLINICAL DATA:  Evaluate pleural effusion and SVC clot. EXAM: CT CHEST WITH CONTRAST TECHNIQUE: Multidetector CT imaging of the chest was performed during intravenous contrast administration. CONTRAST:  2mL OMNIPAQUE IOHEXOL 300 MG/ML  SOLN COMPARISON:  11/03/2016 FINDINGS: Cardiovascular: Normal heart size. Trace pericardial fluid or thickening at the apex. There is aortic and coronary atherosclerotic calcification. There was clinical concern for SVC compromise. None of the nodes compress the SVC which shows no expansion or abnormal adjacent fat stranding. Mediastinum/Nodes: Calcified mediastinal adenopathy that is stable on correlates with sarcoid. Lungs/Pleura: Honeycombing in the right upper lobe with greater opacity than on prior (thicker walled airspaces). Similar pattern of patchy airspace opacity in the left upper lobe. Mild honeycombing along the lower left major fissure in the  posterior left lung. Upper Abdomen: Chronically lobulated liver which may be from patient's sarcoidosis. Granulomatous calcifications in the spleen. Granulomatous type calcifications within deep liver drainage lymph nodes. Numerous calculi within the scarred right kidney, new. There is a 6 mm stone at the right renal pelvis without hydronephrosis. Musculoskeletal: No acute or aggressive finding Technologist called because this is a enhanced scan but there is no contrast within the vessels. The same findings are present on the prior neck CT and 150 cc of contrast has likely been extravasated into the left forearm between the 2 exams. On exam the left forearm is swollen but minimally tender. Patient is neurovascular early intact. These results were called by telephone at the time of interpretation on 04/12/2018 at 4:49 am to Dr. Addison Lank , who verbally acknowledged these results. IMPRESSION: 1. Sarcoidosis with pleural and parenchymal opacity. Increased density in the affected right upper lobe, possible superimposed pneumonia. 2. Very scarred right kidney with multiple calculi that have developed since a 2018 CT. There is a 6 mm stone at the right renal pelvis with no hydronephrosis. 3. Extravasation of contrast into the left upper extremity (likely 150 cc). The arm is swollen but neurovascular exam intact. Electronically Signed   By: Monte Fantasia M.D.   On: 04/12/2018 04:50   US Renal  Result Date: 04/12/2018 CLINICAL DATA:  Sepsis EXAM: RENAL / URINARY TRACT ULTRASOUND COMPLETE COMPARISON:  Contemporaneous chest CT.  02/03/2017 ultrasound FINDINGS: Right Kidney: Renal measurements: 5.5 x 3.4 x 3.5 cm = volume: 34 mL. Size asymmetry is related to severe upper pole scarring by CT. There also numerous calculi that are not seen on this scan. No hydronephrosis or perinephric collection. Left Kidney: Renal measurements: 10.9 x 5 x 6 cm = volume: 173 mL. Echogenicity within normal limits. No mass or hydronephrosis  visualized. Bladder: Appears normal for degree of bladder distention. IMPRESSION: Advanced right renal scarring with multiple calculi by preceding chest CT. No hydronephrosis. Electronically Signed   By: Monte Fantasia M.D.   On: 04/12/2018 05:07   Dg Chest Port 1 View  Result Date: 04/12/2018 CLINICAL DATA:  Patient being treated for sepsis. Fever. History of sarcoid. EXAM: PORTABLE CHEST 1 VIEW COMPARISON:  03/05/2018 and 03/19/2018 FINDINGS: Diffuse coarsened interstitial lung markings are again noted of the right lung and left lung base. Heart size is top normal with aortic atherosclerosis. Along the periphery of the right hemithorax is slightly more confluent and minimally more prominent opacity that may reflect pleural small loculated effusion. Otherwise, no significant change. IMPRESSION: Slightly more prominent opacity along the periphery of the right upper thorax that  may reflect a loculated pleural effusion. Coarsened interstitial lung markings involving much of the right lung and left lung base persist and likely reflect the patient's underlying history of sarcoid. Electronically Signed   By: Ashley Royalty M.D.   On: 04/12/2018 00:49   Pertinent labs & imaging results that were available during my care of the patient were reviewed by me and considered in my medical decision making (see chart for details).  Medications Ordered in ED Medications  0.9 %  sodium chloride infusion (1,000 mLs Intravenous New Bag/Given 04/12/18 0043)  metroNIDAZOLE (FLAGYL) IVPB 500 mg (0 mg Intravenous Stopped 04/12/18 0230)  potassium chloride 10 mEq in 100 mL IVPB (10 mEq Intravenous New Bag/Given 04/12/18 0509)  hydrocortisone sodium succinate (SOLU-CORTEF) 100 MG injection 50 mg (50 mg Intravenous Given 04/12/18 0511)  acetaminophen (TYLENOL) tablet 650 mg (has no administration in time range)    Or  acetaminophen (TYLENOL) suppository 650 mg (has no administration in time range)  ondansetron (ZOFRAN) tablet 4 mg  (has no administration in time range)    Or  ondansetron (ZOFRAN) injection 4 mg (has no administration in time range)  enoxaparin (LOVENOX) injection 40 mg (has no administration in time range)  insulin aspart (novoLOG) injection 0-15 Units (has no administration in time range)  albuterol (PROVENTIL) (2.5 MG/3ML) 0.083% nebulizer solution 2.5 mg (has no administration in time range)  atorvastatin (LIPITOR) tablet 40 mg (has no administration in time range)  vancomycin (VANCOCIN) 1,750 mg in sodium chloride 0.9 % 500 mL IVPB (has no administration in time range)  ceFEPIme (MAXIPIME) 1 g in sodium chloride 0.9 % 100 mL IVPB (has no administration in time range)  sodium chloride 0.9 % bolus 1,000 mL (0 mLs Intravenous Stopped 04/12/18 0043)    And  sodium chloride 0.9 % bolus 500 mL (0 mLs Intravenous Stopped 04/12/18 0337)  ceFEPIme (MAXIPIME) 2 g in sodium chloride 0.9 % 100 mL IVPB (0 g Intravenous Stopped 04/12/18 0026)  vancomycin (VANCOCIN) 1,500 mg in sodium chloride 0.9 % 500 mL IVPB (0 mg Intravenous Stopped 04/12/18 0513)  acetaminophen (TYLENOL) tablet 1,000 mg (1,000 mg Oral Given 04/12/18 0026)  ondansetron (ZOFRAN) injection 4 mg (4 mg Intravenous Given 04/12/18 0026)  sodium chloride 0.9 % bolus 1,000 mL (0 mLs Intravenous Stopped 04/12/18 0300)  iohexol (OMNIPAQUE) 300 MG/ML solution 75 mL (75 mLs Intravenous Contrast Given 04/12/18 0231)  iohexol (OMNIPAQUE) 300 MG/ML solution 75 mL (75 mLs Intravenous Contrast Given 04/12/18 0412)                                                                                                                                    Procedures Procedures CRITICAL CARE Performed by: Grayce Sessions Cardama Total critical care time: 65 minutes Critical care time was exclusive of separately billable procedures and treating other patients. Critical care was necessary to treat or prevent imminent or life-threatening deterioration. Critical care was time  spent personally  by me on the following activities: development of treatment plan with patient and/or surrogate as well as nursing, discussions with consultants, evaluation of patient's response to treatment, examination of patient, obtaining history from patient or surrogate, ordering and performing treatments and interventions, ordering and review of laboratory studies, ordering and review of radiographic studies, pulse oximetry and re-evaluation of patient's condition.   (including critical care time)  Medical Decision Making / ED Course I have reviewed the nursing notes for this encounter and the patient's prior records (if available in EHR or on provided paperwork).    Patient is febrile with tachycardia and soft blood pressures.  She is ill-appearing with evidence of dehydration.  Code sepsis was initiated and patient was started on empiric antibiotics.  Given her appearance and soft blood pressures, 30 cc/kg of IV fluid were ordered.  UA did not reveal evidence of infection.  There is no leukocytosis on CBC.  Lactic acid was negative.  Influenza was obtained and also negative.  Chest x-ray revealed right-sided pulmonary changes from sarcoidosis with possible loculated fluid.  On exam patient, was complaining of jaw pain and has foul smell.  CT of the throat and neck without evidence of obvious infection.  Will obtain a strep throat.  CT of the neck showed incomplete evaluation of the right upper lobe loculated effusion.  CT of the chest revealed possible pneumonia.  Patient was admitted to medicine for further work-up and management.  Patient had IV contrast dye extravasate. NVI.  Will require frequent checks.   Final Clinical Impression(s) / ED Diagnoses Final diagnoses:  Sepsis ([place])      This chart was dictated using voice recognition software.  Despite best efforts to proofread,  errors can occur which can change the documentation meaning.   Fatima Blank, MD 04/12/18 [phone]

## 2018-04-11 NOTE — ED Triage Notes (Signed)
Per EMS, patient is being treated for sepsis from UTI. Per daughter said pt has been declining over the past few days. Per EMS, pt had rhonchi breathing and upon arrival pt began wheezing. Pt vomited prior to arrival. But given 4mg  zofran IM. Pt still taking keflex. Per EMS vitals are temp 104, CBG 112, HR 97, BP 100/68, RR26, SO2. EMS unable to get IV access.

## 2018-04-12 ENCOUNTER — Emergency Department (HOSPITAL_COMMUNITY): Payer: Medicare Other

## 2018-04-12 DIAGNOSIS — Z7952 Long term (current) use of systemic steroids: Secondary | ICD-10-CM | POA: Diagnosis not present

## 2018-04-12 DIAGNOSIS — E1122 Type 2 diabetes mellitus with diabetic chronic kidney disease: Secondary | ICD-10-CM | POA: Diagnosis present

## 2018-04-12 DIAGNOSIS — N200 Calculus of kidney: Secondary | ICD-10-CM | POA: Diagnosis present

## 2018-04-12 DIAGNOSIS — D860 Sarcoidosis of lung: Secondary | ICD-10-CM | POA: Diagnosis present

## 2018-04-12 DIAGNOSIS — Z6841 Body Mass Index (BMI) 40.0 and over, adult: Secondary | ICD-10-CM | POA: Diagnosis not present

## 2018-04-12 DIAGNOSIS — M109 Gout, unspecified: Secondary | ICD-10-CM | POA: Diagnosis present

## 2018-04-12 DIAGNOSIS — A419 Sepsis, unspecified organism: Secondary | ICD-10-CM | POA: Diagnosis present

## 2018-04-12 DIAGNOSIS — E876 Hypokalemia: Secondary | ICD-10-CM | POA: Diagnosis not present

## 2018-04-12 DIAGNOSIS — Z1623 Resistance to quinolones and fluoroquinolones: Secondary | ICD-10-CM | POA: Diagnosis present

## 2018-04-12 DIAGNOSIS — D649 Anemia, unspecified: Secondary | ICD-10-CM | POA: Diagnosis present

## 2018-04-12 DIAGNOSIS — N184 Chronic kidney disease, stage 4 (severe): Secondary | ICD-10-CM | POA: Diagnosis present

## 2018-04-12 DIAGNOSIS — N183 Chronic kidney disease, stage 3 (moderate): Secondary | ICD-10-CM | POA: Diagnosis not present

## 2018-04-12 DIAGNOSIS — R197 Diarrhea, unspecified: Secondary | ICD-10-CM | POA: Diagnosis present

## 2018-04-12 DIAGNOSIS — E119 Type 2 diabetes mellitus without complications: Secondary | ICD-10-CM | POA: Diagnosis not present

## 2018-04-12 DIAGNOSIS — J9601 Acute respiratory failure with hypoxia: Secondary | ICD-10-CM | POA: Diagnosis not present

## 2018-04-12 DIAGNOSIS — D869 Sarcoidosis, unspecified: Secondary | ICD-10-CM | POA: Diagnosis not present

## 2018-04-12 DIAGNOSIS — J189 Pneumonia, unspecified organism: Secondary | ICD-10-CM | POA: Diagnosis present

## 2018-04-12 DIAGNOSIS — L89312 Pressure ulcer of right buttock, stage 2: Secondary | ICD-10-CM | POA: Diagnosis present

## 2018-04-12 DIAGNOSIS — J45909 Unspecified asthma, uncomplicated: Secondary | ICD-10-CM | POA: Diagnosis present

## 2018-04-12 DIAGNOSIS — E039 Hypothyroidism, unspecified: Secondary | ICD-10-CM | POA: Diagnosis present

## 2018-04-12 DIAGNOSIS — Z79899 Other long term (current) drug therapy: Secondary | ICD-10-CM | POA: Diagnosis not present

## 2018-04-12 DIAGNOSIS — E1165 Type 2 diabetes mellitus with hyperglycemia: Secondary | ICD-10-CM | POA: Diagnosis not present

## 2018-04-12 DIAGNOSIS — G9341 Metabolic encephalopathy: Secondary | ICD-10-CM | POA: Diagnosis present

## 2018-04-12 DIAGNOSIS — Z8673 Personal history of transient ischemic attack (TIA), and cerebral infarction without residual deficits: Secondary | ICD-10-CM | POA: Diagnosis not present

## 2018-04-12 DIAGNOSIS — L89322 Pressure ulcer of left buttock, stage 2: Secondary | ICD-10-CM | POA: Diagnosis present

## 2018-04-12 LAB — CBC
HCT: 31.7 % — ABNORMAL LOW (ref 36.0–46.0)
Hemoglobin: 9.1 g/dL — ABNORMAL LOW (ref 12.0–15.0)
MCH: 28.3 pg (ref 26.0–34.0)
MCHC: 28.7 g/dL — ABNORMAL LOW (ref 30.0–36.0)
MCV: 98.8 fL (ref 80.0–100.0)
Platelets: 274 10*3/uL (ref 150–400)
RBC: 3.21 MIL/uL — ABNORMAL LOW (ref 3.87–5.11)
RDW: 15.3 % (ref 11.5–15.5)
WBC: 4.4 10*3/uL (ref 4.0–10.5)
nRBC: 0 % (ref 0.0–0.2)

## 2018-04-12 LAB — COMPREHENSIVE METABOLIC PANEL
ALT: 7 U/L (ref 0–44)
AST: 20 U/L (ref 15–41)
Albumin: 3.1 g/dL — ABNORMAL LOW (ref 3.5–5.0)
Alkaline Phosphatase: 72 U/L (ref 38–126)
Anion gap: 17 — ABNORMAL HIGH (ref 5–15)
BUN: <5 mg/dL — ABNORMAL LOW (ref 8–23)
CO2: 26 mmol/L (ref 22–32)
Calcium: 8.6 mg/dL — ABNORMAL LOW (ref 8.9–10.3)
Chloride: 92 mmol/L — ABNORMAL LOW (ref 98–111)
Creatinine, Ser: 1.33 mg/dL — ABNORMAL HIGH (ref 0.44–1.00)
GFR calc non Af Amer: 41 mL/min — ABNORMAL LOW (ref >60)
GFR, EST AFRICAN AMERICAN: 48 mL/min — AB (ref >60)
Glucose, Bld: 84 mg/dL (ref 70–99)
POTASSIUM: 3.1 mmol/L — AB (ref 3.5–5.1)
Sodium: 135 mmol/L (ref 135–145)
Total Bilirubin: 2.4 mg/dL — ABNORMAL HIGH (ref 0.3–1.2)
Total Protein: 7.2 g/dL (ref 6.5–8.1)

## 2018-04-12 LAB — INFLUENZA PANEL BY PCR (TYPE A & B)
Influenza A By PCR: NEGATIVE
Influenza B By PCR: NEGATIVE

## 2018-04-12 LAB — URINALYSIS, ROUTINE W REFLEX MICROSCOPIC
BILIRUBIN URINE: NEGATIVE
Glucose, UA: NEGATIVE mg/dL
Hgb urine dipstick: NEGATIVE
Ketones, ur: 20 mg/dL — AB
LEUKOCYTE UA: NEGATIVE
NITRITE: NEGATIVE
PH: 5 (ref 5.0–8.0)
Protein, ur: 30 mg/dL — AB
Specific Gravity, Urine: 1.019 (ref 1.005–1.030)

## 2018-04-12 LAB — CBC WITH DIFFERENTIAL/PLATELET
Abs Immature Granulocytes: 0.08 10*3/uL — ABNORMAL HIGH (ref 0.00–0.07)
Basophils Absolute: 0.1 10*3/uL (ref 0.0–0.1)
Basophils Relative: 1 %
Eosinophils Absolute: 0.4 10*3/uL (ref 0.0–0.5)
Eosinophils Relative: 9 %
HEMATOCRIT: 33.2 % — AB (ref 36.0–46.0)
Hemoglobin: 10 g/dL — ABNORMAL LOW (ref 12.0–15.0)
Immature Granulocytes: 2 %
Lymphocytes Relative: 27 %
Lymphs Abs: 1.3 10*3/uL (ref 0.7–4.0)
MCH: 27.9 pg (ref 26.0–34.0)
MCHC: 30.1 g/dL (ref 30.0–36.0)
MCV: 92.5 fL (ref 80.0–100.0)
MONOS PCT: 13 %
Monocytes Absolute: 0.6 10*3/uL (ref 0.1–1.0)
Neutro Abs: 2.4 10*3/uL (ref 1.7–7.7)
Neutrophils Relative %: 48 %
Platelets: 506 10*3/uL — ABNORMAL HIGH (ref 150–400)
RBC: 3.59 MIL/uL — ABNORMAL LOW (ref 3.87–5.11)
RDW: 15.1 % (ref 11.5–15.5)
WBC: 4.8 10*3/uL (ref 4.0–10.5)
nRBC: 0 % (ref 0.0–0.2)

## 2018-04-12 LAB — BASIC METABOLIC PANEL
Anion gap: 14 (ref 5–15)
BUN: 5 mg/dL — AB (ref 8–23)
CO2: 21 mmol/L — ABNORMAL LOW (ref 22–32)
Calcium: 7.5 mg/dL — ABNORMAL LOW (ref 8.9–10.3)
Chloride: 102 mmol/L (ref 98–111)
Creatinine, Ser: 1.33 mg/dL — ABNORMAL HIGH (ref 0.44–1.00)
GFR calc Af Amer: 48 mL/min — ABNORMAL LOW (ref >60)
GFR calc non Af Amer: 41 mL/min — ABNORMAL LOW (ref >60)
Glucose, Bld: 78 mg/dL (ref 70–99)
Potassium: 3.4 mmol/L — ABNORMAL LOW (ref 3.5–5.1)
Sodium: 137 mmol/L (ref 135–145)

## 2018-04-12 LAB — GLUCOSE, CAPILLARY: Glucose-Capillary: 133 mg/dL — ABNORMAL HIGH (ref 70–99)

## 2018-04-12 LAB — LACTIC ACID, PLASMA
Lactic Acid, Venous: 1.1 mmol/L (ref 0.5–1.9)
Lactic Acid, Venous: 1.3 mmol/L (ref 0.5–1.9)

## 2018-04-12 LAB — PROCALCITONIN: Procalcitonin: <0.1 ng/mL

## 2018-04-12 LAB — GROUP A STREP BY PCR: Group A Strep by PCR: NOT DETECTED

## 2018-04-12 LAB — CBG MONITORING, ED
GLUCOSE-CAPILLARY: 141 mg/dL — AB (ref 70–99)
Glucose-Capillary: 101 mg/dL — ABNORMAL HIGH (ref 70–99)
Glucose-Capillary: 117 mg/dL — ABNORMAL HIGH (ref 70–99)

## 2018-04-12 LAB — LIPASE, BLOOD: Lipase: 25 U/L (ref 11–51)

## 2018-04-12 LAB — PROTIME-INR
INR: 1.1 (ref 0.8–1.2)
Prothrombin Time: 14.5 seconds (ref 11.4–15.2)

## 2018-04-12 MED ORDER — ONDANSETRON HCL 4 MG/2ML IJ SOLN
4.0000 mg | Freq: Once | INTRAMUSCULAR | Status: AC
  Administered 2018-04-12: 4 mg via INTRAVENOUS
  Filled 2018-04-12: qty 2

## 2018-04-12 MED ORDER — ONDANSETRON HCL 4 MG/2ML IJ SOLN
4.0000 mg | Freq: Four times a day (QID) | INTRAMUSCULAR | Status: DC | PRN
  Administered 2018-04-16 – 2018-04-17 (×3): 4 mg via INTRAVENOUS
  Filled 2018-04-12 (×3): qty 2

## 2018-04-12 MED ORDER — ONDANSETRON HCL 4 MG PO TABS: 4.0000 mg | ORAL_TABLET | Freq: Four times a day (QID) | ORAL | Status: DC | PRN

## 2018-04-12 MED ORDER — ATORVASTATIN CALCIUM 40 MG PO TABS
40.0000 mg | ORAL_TABLET | Freq: Every day | ORAL | Status: DC
  Administered 2018-04-12 – 2018-04-20 (×8): 40 mg via ORAL
  Filled 2018-04-12 (×8): qty 1

## 2018-04-12 MED ORDER — HYDROCORTISONE NA SUCCINATE PF 100 MG IJ SOLR
50.0000 mg | Freq: Three times a day (TID) | INTRAMUSCULAR | Status: DC
  Administered 2018-04-12 – 2018-04-13 (×5): 50 mg via INTRAVENOUS
  Filled 2018-04-12 (×5): qty 2

## 2018-04-12 MED ORDER — VANCOMYCIN HCL 10 G IV SOLR
1750.0000 mg | INTRAVENOUS | Status: DC
  Administered 2018-04-14: 1750 mg via INTRAVENOUS
  Filled 2018-04-12: qty 1750

## 2018-04-12 MED ORDER — ENOXAPARIN SODIUM 40 MG/0.4ML ~~LOC~~ SOLN
40.0000 mg | SUBCUTANEOUS | Status: DC
  Administered 2018-04-12 – 2018-04-20 (×9): 40 mg via SUBCUTANEOUS
  Filled 2018-04-12 (×9): qty 0.4

## 2018-04-12 MED ORDER — SODIUM CHLORIDE 0.9 % IV BOLUS
1000.0000 mL | Freq: Once | INTRAVENOUS | Status: AC
  Administered 2018-04-12: 1000 mL via INTRAVENOUS

## 2018-04-12 MED ORDER — ACETAMINOPHEN 500 MG PO TABS
1000.0000 mg | ORAL_TABLET | Freq: Once | ORAL | Status: AC
  Administered 2018-04-12: 1000 mg via ORAL
  Filled 2018-04-12: qty 2

## 2018-04-12 MED ORDER — IOHEXOL 300 MG/ML  SOLN
75.0000 mL | Freq: Once | INTRAMUSCULAR | Status: AC | PRN
  Administered 2018-04-12: 75 mL via INTRAVENOUS

## 2018-04-12 MED ORDER — ACETAMINOPHEN 650 MG RE SUPP: 650.0000 mg | Freq: Four times a day (QID) | RECTAL | Status: DC | PRN

## 2018-04-12 MED ORDER — POTASSIUM CHLORIDE 10 MEQ/100ML IV SOLN
10.0000 meq | INTRAVENOUS | Status: AC
  Administered 2018-04-12 (×4): 10 meq via INTRAVENOUS
  Filled 2018-04-12 (×4): qty 100

## 2018-04-12 MED ORDER — ALBUTEROL SULFATE (2.5 MG/3ML) 0.083% IN NEBU: 2.5000 mg | INHALATION_SOLUTION | Freq: Four times a day (QID) | RESPIRATORY_TRACT | Status: DC | PRN

## 2018-04-12 MED ORDER — SODIUM CHLORIDE 0.9 % IV SOLN
1.0000 g | Freq: Two times a day (BID) | INTRAVENOUS | Status: DC
  Administered 2018-04-12 – 2018-04-13 (×3): 1 g via INTRAVENOUS
  Filled 2018-04-12 (×4): qty 1

## 2018-04-12 MED ORDER — ACETAMINOPHEN 325 MG PO TABS: 650.0000 mg | ORAL_TABLET | Freq: Four times a day (QID) | ORAL | Status: DC | PRN

## 2018-04-12 MED ORDER — INSULIN ASPART 100 UNIT/ML ~~LOC~~ SOLN
0.0000 [IU] | Freq: Three times a day (TID) | SUBCUTANEOUS | Status: DC
  Administered 2018-04-12 – 2018-04-13 (×2): 2 [IU] via SUBCUTANEOUS

## 2018-04-12 NOTE — ED Notes (Signed)
Patient transported to CT 

## 2018-04-12 NOTE — ED Notes (Addendum)
ED TO INPATIENT HANDOFF REPORT  ED Nurse Name and Phone #:  Jenny Reichmann RN [phone]  S Name/Age/Gender Darlene Stafford 68 y.o. female Room/Bed: 025C/025C  Code Status   Code Status: Full Code  Home/SNF/Other Rehab Patient oriented to: self Is this baseline? Yes   Triage Complete: Triage complete(Simultaneous filing. User may not have seen previous data.)  Chief Complaint Sepsis  Triage Note Per EMS, patient is being treated for sepsis from UTI. Per daughter said pt has been declining over the past few days. Per EMS, pt had rhonchi breathing and upon arrival pt began wheezing. Pt vomited prior to arrival. But given 4mg  zofran IM. Pt still taking keflex. Per EMS vitals are temp 104, CBG 112, HR 97, BP 100/68, RR26, SO2. EMS unable to get IV access.    Allergies Allergies  Allergen Reactions  . Fish Allergy Anaphylaxis  . Shellfish Allergy Anaphylaxis  . Sulfa Antibiotics Hives and Itching    Level of Care/Admitting Diagnosis ED Disposition    ED Disposition Condition Ideal Hospital Area: [place] [[id number]]  Level of Care: Progressive [102]  Diagnosis: Sepsis [facility]) [[id number]]  Admitting Physician: Doreatha Massed  Attending Physician: Nita Sells [phone]  Estimated length of stay: past midnight tomorrow  Certification:: I certify this patient will need inpatient services for at least 2 midnights  PT Class (Do Not Modify): Inpatient [101]  PT Acc Code (Do Not Modify): Private [1]       B Medical/Surgery History Past Medical History:  Diagnosis Date  . Asthma   . CVA (cerebral vascular accident) ([place])   . Diabetes mellitus without complication ([place])   . Sarcoidosis of other sites    Ocular   Past Surgical History:  Procedure Laterality Date  . PARTIAL HYSTERECTOMY  1998  . REPLACEMENT TOTAL KNEE  2015     A IV Location/Drains/Wounds Patient Lines/Drains/Airways Status   Active Line/Drains/Airways    Name:   Placement  date:   Placement time:   Site:   Days:   Peripheral IV 04/11/18 Right Antecubital   04/11/18    2343    Antecubital   1   External Urinary Catheter   03/19/18    1835    -   24          Intake/Output Last 24 hours  Intake/Output Summary (Last 24 hours) at 04/12/2018 1619 Last data filed at 04/12/2018 0230 Gross per 24 hour  Intake 100 ml  Output -  Net 100 ml    Labs/Imaging Results for orders placed or performed during the hospital encounter of 04/11/18 (from the past 48 hour(s))  Blood Culture (routine x 2)     Status: None (Preliminary result)   Collection Time: 04/11/18 11:15 PM  Result Value Ref Range   Specimen Description BLOOD LEFT ARM    Special Requests      BOTTLES DRAWN AEROBIC AND ANAEROBIC Blood Culture adequate volume Performed at [facility], [place] [address], [place], [place] [address]    Culture PENDING    Report Status PENDING   Lactic acid, plasma     Status: None   Collection Time: 04/11/18 11:30 PM  Result Value Ref Range   Lactic Acid, Venous 1.3 0.5 - 1.9 mmol/L    Comment: Performed at [facility], [place] [address], [place], [place] [address]  Comprehensive metabolic panel     Status: Abnormal   Collection Time: 04/11/18 11:31 PM  Result  Value Ref Range   Sodium 135 135 - 145 mmol/L   Potassium 3.1 (L) 3.5 - 5.1 mmol/L   Chloride 92 (L) 98 - 111 mmol/L   CO2 26 22 - 32 mmol/L   Glucose, Bld 84 70 - 99 mg/dL   BUN <5 (L) 8 - 23 mg/dL   Creatinine, Ser 1.33 (H) 0.44 - 1.00 mg/dL   Calcium 8.6 (L) 8.9 - 10.3 mg/dL   Total Protein 7.2 6.5 - 8.1 g/dL   Albumin 3.1 (L) 3.5 - 5.0 g/dL   AST 20 15 - 41 U/L   ALT 7 0 - 44 U/L   Alkaline Phosphatase 72 38 - 126 U/L   Total Bilirubin 2.4 (H) 0.3 - 1.2 mg/dL   GFR calc non Af Amer 41 (L) >60 mL/min   GFR calc Af Amer 48 (L) >60 mL/min   Anion gap 17 (H) 5 - 15    Comment: Performed at [facility], [place] [address], [place], [place] [address]  CBC WITH DIFFERENTIAL     Status: Abnormal    Collection Time: 04/11/18 11:31 PM  Result Value Ref Range   WBC 4.8 4.0 - 10.5 K/uL   RBC 3.59 (L) 3.87 - 5.11 MIL/uL   Hemoglobin 10.0 (L) 12.0 - 15.0 g/dL   HCT 33.2 (L) 36.0 - 46.0 %   MCV 92.5 80.0 - 100.0 fL   MCH 27.9 26.0 - 34.0 pg   MCHC 30.1 30.0 - 36.0 g/dL   RDW 15.1 11.5 - 15.5 %   Platelets 506 (H) 150 - 400 K/uL   nRBC 0.0 0.0 - 0.2 %   Neutrophils Relative % 48 %   Neutro Abs 2.4 1.7 - 7.7 K/uL   Lymphocytes Relative 27 %   Lymphs Abs 1.3 0.7 - 4.0 K/uL   Monocytes Relative 13 %   Monocytes Absolute 0.6 0.1 - 1.0 K/uL   Eosinophils Relative 9 %   Eosinophils Absolute 0.4 0.0 - 0.5 K/uL   Basophils Relative 1 %   Basophils Absolute 0.1 0.0 - 0.1 K/uL   Immature Granulocytes 2 %   Abs Immature Granulocytes 0.08 (H) 0.00 - 0.07 K/uL    Comment: Performed at [facility] [address] [place], [place] [address]  Lipase, blood     Status: None   Collection Time: 04/11/18 11:31 PM  Result Value Ref Range   Lipase 25 11 - 51 U/L    Comment: Performed at [facility], [place] [address], [place], [place] [address]  Procalcitonin     Status: None   Collection Time: 04/11/18 11:31 PM  Result Value Ref Range   Procalcitonin <0.10 ng/mL    Comment:        Interpretation: PCT (Procalcitonin) <= 0.5 ng/mL: Systemic infection (sepsis) is not likely. Local bacterial infection is possible. (NOTE)       Sepsis PCT Algorithm           Lower Respiratory Tract                                      Infection PCT Algorithm    ----------------------------     ----------------------------         PCT < 0.25 ng/mL                PCT < 0.10 ng/mL  Strongly encourage             Strongly discourage   discontinuation of antibiotics    initiation of antibiotics    ----------------------------     -----------------------------       PCT 0.25 - 0.50 ng/mL            PCT 0.10 - 0.25 ng/mL               OR       >80% decrease in PCT            Discourage initiation  of                                            antibiotics      Encourage discontinuation           of antibiotics    ----------------------------     -----------------------------         PCT >= 0.50 ng/mL              PCT 0.26 - 0.50 ng/mL               AND        <80% decrease in PCT             Encourage initiation of                                             antibiotics       Encourage continuation           of antibiotics    ----------------------------     -----------------------------        PCT >= 0.50 ng/mL                  PCT > 0.50 ng/mL               AND         increase in PCT                  Strongly encourage                                      initiation of antibiotics    Strongly encourage escalation           of antibiotics                                     -----------------------------                                           PCT <= 0.25 ng/mL                                                 OR                                        >  80% decrease in PCT                                     Discontinue / Do not initiate                                             antibiotics Performed at [facility], [place] [address] [place], [place] [address]   Protime-INR     Status: None   Collection Time: 04/11/18 11:31 PM  Result Value Ref Range   Prothrombin Time 14.5 11.4 - 15.2 seconds   INR 1.1 0.8 - 1.2    Comment: (NOTE) INR goal varies based on device and disease states. Performed at [facility], [place] [address], [place], [place] [address]   POCT I-Stat EG7     Status: Abnormal   Collection Time: 04/11/18 11:42 PM  Result Value Ref Range   pH, Ven 7.378 7.250 - 7.430   pCO2, Ven 50.2 44.0 - 60.0 mmHg   pO2, Ven 25.0 (LL) 32.0 - 45.0 mmHg   Bicarbonate 29.5 (H) 20.0 - 28.0 mmol/L   TCO2 31 22 - 32 mmol/L   O2 Saturation 43.0 %   Acid-Base Excess 4.0 (H) 0.0 - 2.0 mmol/L   Sodium 136 135 - 145 mmol/L   Potassium 3.2 (L) 3.5 - 5.1 mmol/L    Calcium, Ion 1.01 (L) 1.15 - 1.40 mmol/L   HCT 32.0 (L) 36.0 - 46.0 %   Hemoglobin 10.9 (L) 12.0 - 15.0 g/dL   Patient temperature HIDE    Sample type VENOUS    Comment NOTIFIED PHYSICIAN   Influenza panel by PCR (type A & B)     Status: None   Collection Time: 04/12/18 12:14 AM  Result Value Ref Range   Influenza A By PCR NEGATIVE NEGATIVE   Influenza B By PCR NEGATIVE NEGATIVE    Comment: (NOTE) The Xpert Xpress Flu assay is intended as an aid in the diagnosis of  influenza and should not be used as a sole basis for treatment.  This  assay is FDA approved for nasopharyngeal swab specimens only. Nasal  washings and aspirates are unacceptable for Xpert Xpress Flu testing. Performed at [facility], [place] [address] [place], [place] [address]   Urinalysis, Routine w reflex microscopic     Status: Abnormal   Collection Time: 04/12/18 12:56 AM  Result Value Ref Range   Color, Urine AMBER (A) YELLOW    Comment: BIOCHEMICALS MAY BE AFFECTED BY COLOR   APPearance CLEAR CLEAR   Specific Gravity, Urine 1.019 1.005 - 1.030   pH 5.0 5.0 - 8.0   Glucose, UA NEGATIVE NEGATIVE mg/dL   Hgb urine dipstick NEGATIVE NEGATIVE   Bilirubin Urine NEGATIVE NEGATIVE   Ketones, ur 20 (A) NEGATIVE mg/dL   Protein, ur 30 (A) NEGATIVE mg/dL   Nitrite NEGATIVE NEGATIVE   Leukocytes,Ua NEGATIVE NEGATIVE   RBC / HPF 0-5 0 - 5 RBC/hpf   WBC, UA 0-5 0 - 5 WBC/hpf   Bacteria, UA RARE (A) NONE SEEN   Squamous Epithelial / LPF 0-5 0 - 5   Mucus PRESENT     Comment: Performed at [facility], [place] [address] [place], [place] [address]  Group A Strep by PCR  Status: None   Collection Time: 04/12/18  4:06 AM  Result Value Ref Range   Group A Strep by PCR NOT DETECTED NOT DETECTED    Comment: Performed at [facility], [address], [place], [place] [address]  Lactic acid, plasma     Status: None   Collection Time: 04/12/18  5:17 AM  Result Value Ref Range   Lactic Acid, Venous 1.1  0.5 - 1.9 mmol/L    Comment: Performed at [facility] [address], [place], [place] [address]  CBC     Status: Abnormal   Collection Time: 04/12/18  5:17 AM  Result Value Ref Range   WBC 4.4 4.0 - 10.5 K/uL   RBC 3.21 (L) 3.87 - 5.11 MIL/uL   Hemoglobin 9.1 (L) 12.0 - 15.0 g/dL   HCT 31.7 (L) 36.0 - 46.0 %   MCV 98.8 80.0 - 100.0 fL    Comment: REPEATED TO VERIFY CORRECTED ON 03/09 AT 4097: PREVIOUSLY REPORTED AS 98.8 POST TRANSFUSION SPECIMEN    MCH 28.3 26.0 - 34.0 pg   MCHC 28.7 (L) 30.0 - 36.0 g/dL   RDW 15.3 11.5 - 15.5 %   Platelets 274 150 - 400 K/uL   nRBC 0.0 0.0 - 0.2 %    Comment: Performed at [facility], [place] [address] [place], [place] [address]  Basic metabolic panel     Status: Abnormal   Collection Time: 04/12/18  5:17 AM  Result Value Ref Range   Sodium 137 135 - 145 mmol/L   Potassium 3.4 (L) 3.5 - 5.1 mmol/L   Chloride 102 98 - 111 mmol/L   CO2 21 (L) 22 - 32 mmol/L   Glucose, Bld 78 70 - 99 mg/dL   BUN 5 (L) 8 - 23 mg/dL   Creatinine, Ser 1.33 (H) 0.44 - 1.00 mg/dL   Calcium 7.5 (L) 8.9 - 10.3 mg/dL   GFR calc non Af Amer 41 (L) >60 mL/min   GFR calc Af Amer 48 (L) >60 mL/min   Anion gap 14 5 - 15    Comment: Performed at [facility] [address], [place], [place] [address]  CBG monitoring, ED     Status: Abnormal   Collection Time: 04/12/18  8:25 AM  Result Value Ref Range   Glucose-Capillary 101 (H) 70 - 99 mg/dL  CBG monitoring, ED     Status: Abnormal   Collection Time: 04/12/18 12:35 PM  Result Value Ref Range   Glucose-Capillary 117 (H) 70 - 99 mg/dL   Ct Soft Tissue Neck W Contrast  Result Date: 04/12/2018 CLINICAL DATA:  Wheezing.  History of sarcoidosis and asthma. EXAM: CT NECK WITH CONTRAST TECHNIQUE: Multidetector CT imaging of the neck was performed using the standard protocol following the bolus administration of intravenous contrast. CONTRAST:  49mL OMNIPAQUE IOHEXOL 300 MG/ML  SOLN COMPARISON:  CT chest  November 03, 2016 FINDINGS: Noisy image quality due to habitus, shoulders at the level of the upper neck. Mild motion degraded examination. PHARYNX AND LARYNX: Normal.  Widely patent airway. SALIVARY GLANDS: Normal. THYROID: Normal. LYMPH NODES: No lymphadenopathy by CT size criteria. VASCULAR: Moderate calcific atherosclerosis carotid siphon. Mild calcific atherosclerosis carotid bifurcations. LIMITED INTRACRANIAL: Normal. VISUALIZED ORBITS: Normal. MASTOIDS AND VISUALIZED PARANASAL SINUSES: Bilateral mastoid effusions without air cell coalescence. SKELETON: Nonacute.  Calcified stylohyoid ligaments. UPPER CHEST: Small RIGHT pleural effusion with dense consolidation with some component of bronchiectasis. Calcified mediastinal lymphadenopathy. OTHER: Trace retropharyngeal effusion versus  artifact. IMPRESSION: 1. Habitus limited examination. Trace retropharyngeal effusion versus artifact. Patent airway. 2. RIGHT pleural effusion with consolidation and bronchiectasis, incompletely evaluated. Calcified mediastinal lymphadenopathy. Electronically Signed   By: Elon Alas M.D.   On: 04/12/2018 02:43   Ct Chest W Contrast  Result Date: 04/12/2018 CLINICAL DATA:  Evaluate pleural effusion and SVC clot. EXAM: CT CHEST WITH CONTRAST TECHNIQUE: Multidetector CT imaging of the chest was performed during intravenous contrast administration. CONTRAST:  71mL OMNIPAQUE IOHEXOL 300 MG/ML  SOLN COMPARISON:  11/03/2016 FINDINGS: Cardiovascular: Normal heart size. Trace pericardial fluid or thickening at the apex. There is aortic and coronary atherosclerotic calcification. There was clinical concern for SVC compromise. None of the nodes compress the SVC which shows no expansion or abnormal adjacent fat stranding. Mediastinum/Nodes: Calcified mediastinal adenopathy that is stable on correlates with sarcoid. Lungs/Pleura: Honeycombing in the right upper lobe with greater opacity than on prior (thicker walled airspaces). Similar  pattern of patchy airspace opacity in the left upper lobe. Mild honeycombing along the lower left major fissure in the posterior left lung. Upper Abdomen: Chronically lobulated liver which may be from patient's sarcoidosis. Granulomatous calcifications in the spleen. Granulomatous type calcifications within deep liver drainage lymph nodes. Numerous calculi within the scarred right kidney, new. There is a 6 mm stone at the right renal pelvis without hydronephrosis. Musculoskeletal: No acute or aggressive finding Technologist called because this is a enhanced scan but there is no contrast within the vessels. The same findings are present on the prior neck CT and 150 cc of contrast has likely been extravasated into the left forearm between the 2 exams. On exam the left forearm is swollen but minimally tender. Patient is neurovascular early intact. These results were called by telephone at the time of interpretation on 04/12/2018 at 4:49 am to Dr. Addison Lank , who verbally acknowledged these results. IMPRESSION: 1. Sarcoidosis with pleural and parenchymal opacity. Increased density in the affected right upper lobe, possible superimposed pneumonia. 2. Very scarred right kidney with multiple calculi that have developed since a 2018 CT. There is a 6 mm stone at the right renal pelvis with no hydronephrosis. 3. Extravasation of contrast into the left upper extremity (likely 150 cc). The arm is swollen but neurovascular exam intact. Electronically Signed   By: Monte Fantasia M.D.   On: 04/12/2018 04:50   US Renal  Result Date: 04/12/2018 CLINICAL DATA:  Sepsis EXAM: RENAL / URINARY TRACT ULTRASOUND COMPLETE COMPARISON:  Contemporaneous chest CT.  02/03/2017 ultrasound FINDINGS: Right Kidney: Renal measurements: 5.5 x 3.4 x 3.5 cm = volume: 34 mL. Size asymmetry is related to severe upper pole scarring by CT. There also numerous calculi that are not seen on this scan. No hydronephrosis or perinephric collection. Left  Kidney: Renal measurements: 10.9 x 5 x 6 cm = volume: 173 mL. Echogenicity within normal limits. No mass or hydronephrosis visualized. Bladder: Appears normal for degree of bladder distention. IMPRESSION: Advanced right renal scarring with multiple calculi by preceding chest CT. No hydronephrosis. Electronically Signed   By: Monte Fantasia M.D.   On: 04/12/2018 05:07   Dg Chest Port 1 View  Result Date: 04/12/2018 CLINICAL DATA:  Patient being treated for sepsis. Fever. History of sarcoid. EXAM: PORTABLE CHEST 1 VIEW COMPARISON:  03/05/2018 and 03/19/2018 FINDINGS: Diffuse coarsened interstitial lung markings are again noted of the right lung and left lung base. Heart size is top normal with aortic atherosclerosis. Along the periphery of the right hemithorax is slightly more confluent  and minimally more prominent opacity that may reflect pleural small loculated effusion. Otherwise, no significant change. IMPRESSION: Slightly more prominent opacity along the periphery of the right upper thorax that may reflect a loculated pleural effusion. Coarsened interstitial lung markings involving much of the right lung and left lung base persist and likely reflect the patient's underlying history of sarcoid. Electronically Signed   By: Ashley Royalty M.D.   On: 04/12/2018 00:49    Pending Labs Unresulted Labs (From admission, onward)    Start     Ordered   04/13/18 0500  CBC with Differential/Platelet  Tomorrow morning,   R    Question:  Specimen collection method  Answer:  IV Team=IV Team collect   04/12/18 0831   04/13/18 6387  Basic metabolic panel  Tomorrow morning,   R    Question:  Specimen collection method  Answer:  IV Team=IV Team collect   04/12/18 0831   04/11/18 2324  Blood gas, venous ([facility], [facility], [facility])  ONCE - STAT,   STAT     04/11/18 2325   04/11/18 2324  Urine culture  ONCE - STAT,   STAT     04/11/18 2325   04/11/18 2323  Blood Culture (routine x 2)  BLOOD CULTURE X 2,   STAT     04/11/18 2325           Vitals/Pain Today's Vitals   04/12/18 1400 04/12/18 1430 04/12/18 1500 04/12/18 1600  BP: 124/63 (!) 114/59 (!) 116/54 (!) 130/58  Pulse: 81 80 80 80  Resp: (!) 22 (!) 26 (!) 27 (!) 29  Temp:      TempSrc:      SpO2: 98% 97% 97% 93%  Weight:      Height:      PainSc:        Isolation Precautions Droplet precaution  Medications Medications  0.9 %  sodium chloride infusion (1,000 mLs Intravenous New Bag/Given 04/12/18 1513)  metroNIDAZOLE (FLAGYL) IVPB 500 mg (0 mg Intravenous Stopped 04/12/18 1300)  hydrocortisone sodium succinate (SOLU-CORTEF) 100 MG injection 50 mg (50 mg Intravenous Given 04/12/18 1116)  acetaminophen (TYLENOL) tablet 650 mg (has no administration in time range)    Or  acetaminophen (TYLENOL) suppository 650 mg (has no administration in time range)  ondansetron (ZOFRAN) tablet 4 mg (has no administration in time range)    Or  ondansetron (ZOFRAN) injection 4 mg (has no administration in time range)  enoxaparin (LOVENOX) injection 40 mg (has no administration in time range)  insulin aspart (novoLOG) injection 0-15 Units (0 Units Subcutaneous Not Given 04/12/18 1345)  albuterol (PROVENTIL) (2.5 MG/3ML) 0.083% nebulizer solution 2.5 mg (has no administration in time range)  atorvastatin (LIPITOR) tablet 40 mg (40 mg Oral Given 04/12/18 1005)  vancomycin (VANCOCIN) 1,750 mg in sodium chloride 0.9 % 500 mL IVPB (has no administration in time range)  ceFEPIme (MAXIPIME) 1 g in sodium chloride 0.9 % 100 mL IVPB (0 g Intravenous Stopped 04/12/18 1446)  sodium chloride 0.9 % bolus 1,000 mL (0 mLs Intravenous Stopped 04/12/18 0043)    And  sodium chloride 0.9 % bolus 500 mL (0 mLs Intravenous Stopped 04/12/18 0337)  ceFEPIme (MAXIPIME) 2 g in sodium chloride 0.9 % 100 mL IVPB (0 g Intravenous Stopped 04/12/18 0026)  vancomycin (VANCOCIN) 1,500 mg in sodium chloride 0.9 % 500 mL IVPB (0 mg Intravenous Stopped 04/12/18 0513)  acetaminophen (TYLENOL) tablet 1,000 mg (1,000 mg  Oral Given 04/12/18 0026)  ondansetron (ZOFRAN) injection 4 mg (  4 mg Intravenous Given 04/12/18 0026)  sodium chloride 0.9 % bolus 1,000 mL (0 mLs Intravenous Stopped 04/12/18 0300)  iohexol (OMNIPAQUE) 300 MG/ML solution 75 mL (75 mLs Intravenous Contrast Given 04/12/18 0231)  potassium chloride 10 mEq in 100 mL IVPB (0 mEq Intravenous Stopped 04/12/18 1116)  iohexol (OMNIPAQUE) 300 MG/ML solution 75 mL (75 mLs Intravenous Contrast Given 04/12/18 0412)    Mobility walks with device Moderate fall risk   Focused Assessments Cardiac Assessment Handoff:    No results found for: CKTOTAL, CKMB, CKMBINDEX, TROPONINI No results found for: DDIMER Does the Patient currently have chest pain? No      R Recommendations: See Admitting Provider Note  Report given to:   Additional Notes:  Recent admission 2/14-2/17 with sepsis secondary to E. coli bacteria  Morbid obesity, prior stroke without deficit, HTN, DM Type 2 6 mm stone right renal pelvis  Son at bedside stating pt is currently receiving PT in the home from previous hospital stay - Pt uses walker and assistance at baseline Pt awaiting a swallowing evaluation - NPO till appropriate diet determined

## 2018-04-12 NOTE — Progress Notes (Signed)
Pharmacy Antibiotic Note  Darlene Stafford is a 68 y.o. female admitted on 04/11/2018 with sepsis.  Pharmacy has been consulted for Vancomycin/Cefepime dosing. WBC WNL. Mild bump in Scr. Being treated for UTI.   Plan: Vancomycin 1750 mg IV q48h >>Estimated AUC: 514 Cefepime 1g IV q12h Flagyl per MD Trend WBC, temp, renal function  F/U infectious work-up Drug levels as indicated   Height: 5\' 1"  (154.9 cm) Weight: 249 lb 1.9 oz (113 kg) IBW/kg (Calculated) : 47.8  Temp (24hrs), Avg:99.5 F (37.5 C), Min:98.1 F (36.7 C), Max:102.2 F (39 C)  Recent Labs  Lab 04/11/18 2330 04/11/18 2331  WBC  --  4.8  CREATININE  --  1.33*  LATICACIDVEN 1.3  --     Estimated Creatinine Clearance: 47.9 mL/min (A) (by C-G formula based on SCr of 1.33 mg/dL (H)).    Allergies  Allergen Reactions  . Fish Allergy Anaphylaxis  . Shellfish Allergy Anaphylaxis  . Sulfa Antibiotics Hives and Itching    Darlene Stafford 04/12/2018 5:19 AM

## 2018-04-12 NOTE — ED Notes (Signed)
Pt oxygen sat dropped into the 80's, placed pt back on 2L [place].

## 2018-04-12 NOTE — ED Notes (Signed)
Due to loss of IV access from previous shift, IV potassium is not being administered at scheduled times.  Pt is currently receiving the last dose (4 of 4)

## 2018-04-12 NOTE — H&P (Signed)
History and Physical    Darlene Stafford SNK:[id number] DOB: [birth year] DOA: 04/11/2018  PCP: Katherina Mires, MD  Patient coming from: Home  I have personally briefly reviewed patient's old medical records in [place]  Chief Complaint: Fever  HPI: Darlene Stafford is a 68 y.o. female with medical history significant of CVA, Sarcoidosis on chronic prednisone, DM2.  Patient was recently admitted from 2/14 to 2/17 with sepsis secondary to UTI.  Urine ultimately grew E.Coli resistant to cipro.  Treated with broad spectrum ABx during admission, stress dose steroids due to hypotension, and ultimately sent home with keflex.  Today she returns to the ED with 2 day h/o fever and fatigue.  Several episodes of NBNB emesis, sore throat and feeling of throat swelling.  Denies any Abd pain, headache, meningismus, flank or back pain, cough, SOB.  Does have mild diarrhea.  ED Course: Tm 102.2, HR 107, RR 30, initial BP 93/57.  Given 2L NS, Tylenol, cefepime, vanc, flagyl.  Influenza neg.  UA neg.  CT neck: ? Loculated pleural effusion on Right?  CXR shows a bunch of scarring in the R lung that's felt to be old (and appears on all the other chest imaging).  Vitals improved after interventions.   Review of Systems: As per HPI otherwise 10 point review of systems negative.   Past Medical History:  Diagnosis Date  . Asthma   . CVA (cerebral vascular accident) ([place])   . Diabetes mellitus without complication ([place])   . Sarcoidosis of other sites    Ocular    Past Surgical History:  Procedure Laterality Date  . PARTIAL HYSTERECTOMY  1998  . REPLACEMENT TOTAL KNEE  2015     reports that she has never smoked. She has never used smokeless tobacco. She reports that she does not drink alcohol or use drugs.  Allergies  Allergen Reactions  . Fish Allergy Anaphylaxis  . Shellfish Allergy Anaphylaxis  . Sulfa Antibiotics Hives and Itching    Family History  Problem Relation Age of Onset  .  Sarcoidosis Sister   . Breast cancer Sister   . Sarcoidosis Sister   . Sarcoidosis Sister      Prior to Admission medications   Medication Sig Start Date End Date Taking? Authorizing Provider  albuterol (PROVENTIL HFA;VENTOLIN HFA) 108 (90 Base) MCG/ACT inhaler Inhale 1-2 puffs into the lungs every 4 (four) hours as needed for shortness of breath. 01/13/18  Yes Chesley Mires, MD  albuterol (PROVENTIL) (2.5 MG/3ML) 0.083% nebulizer solution Take 3 mLs (2.5 mg total) by nebulization every 6 (six) hours as needed for wheezing or shortness of breath. 04/13/17  Yes Chesley Mires, MD  atorvastatin (LIPITOR) 40 MG tablet Take 40 mg by mouth daily.   Yes [provider]  carvedilol (COREG) 12.5 MG tablet Take 12.5 mg by mouth 2 (two) times daily with a meal.   Yes [provider]  guaiFENesin (MUCINEX) 600 MG 12 hr tablet Take 1 tablet (600 mg total) by mouth 2 (two) times daily. Patient taking differently: Take 600 mg by mouth as needed for to loosen phlegm.  04/14/15  Yes Barton Dubois, MD  montelukast (SINGULAIR) 10 MG tablet Take 1 tablet (10 mg total) by mouth at bedtime. 08/22/16  Yes Chesley Mires, MD  omeprazole (PRILOSEC) 20 MG capsule Take 20 mg by mouth daily. 02/25/18  Yes [provider]  predniSONE (DELTASONE) 2.5 MG tablet Take 1 tablet (2.5 mg total) by mouth daily with breakfast. 03/17/18  Yes Sood,  Vineet, MD    Physical Exam: Vitals:   04/12/18 0225 04/12/18 0227 04/12/18 0228 04/12/18 0245  BP:   99/62 101/66  Pulse:   82 84  Resp:  20 20   Temp: 98.1 F (36.7 C)  98.1 F (36.7 C)   TempSrc: Oral  Oral   SpO2:   100% 98%  Weight:      Height:        Constitutional: NAD, calm, comfortable Eyes: PERRL, lids and conjunctivae normal ENMT: Mucous membranes are moist. Posterior pharynx clear of any exudate or lesions.Normal dentition.  Neck: normal, supple, no masses, no thyromegaly Respiratory: clear to auscultation bilaterally, no wheezing, no  crackles. Normal respiratory effort. No accessory muscle use.  Cardiovascular: Regular rate and rhythm, no murmurs / rubs / gallops. No extremity edema. 2+ pedal pulses. No carotid bruits.  Abdomen: no tenderness, no masses palpated. No hepatosplenomegaly. Bowel sounds positive.  Musculoskeletal: no clubbing / cyanosis. No joint deformity upper and lower extremities. Good ROM, no contractures. Normal muscle tone.  Skin: no rashes, lesions, ulcers. No induration Neurologic: CN 2-12 grossly intact. Sensation intact, DTR normal. Strength 5/5 in all 4.  Psychiatric: Normal judgment and insight. Alert and oriented x 3. Normal mood.    Labs on Admission: I have personally reviewed following labs and imaging studies  CBC: Recent Labs  Lab 04/11/18 2331 04/11/18 2342  WBC 4.8  --   NEUTROABS 2.4  --   HGB 10.0* 10.9*  HCT 33.2* 32.0*  MCV 92.5  --   PLT 506*  --    Basic Metabolic Panel: Recent Labs  Lab 04/11/18 2331 04/11/18 2342  NA 135 136  K 3.1* 3.2*  CL 92*  --   CO2 26  --   GLUCOSE 84  --   BUN <5*  --   CREATININE 1.33*  --   CALCIUM 8.6*  --    GFR: Estimated Creatinine Clearance: 47.9 mL/min (A) (by C-G formula based on SCr of 1.33 mg/dL (H)). Liver Function Tests: Recent Labs  Lab 04/11/18 2331  AST 20  ALT 7  ALKPHOS 72  BILITOT 2.4*  PROT 7.2  ALBUMIN 3.1*   Recent Labs  Lab 04/11/18 2331  LIPASE 25   No results for input(s): AMMONIA in the last 168 hours. Coagulation Profile: Recent Labs  Lab 04/11/18 2331  INR 1.1   Cardiac Enzymes: No results for input(s): CKTOTAL, CKMB, CKMBINDEX, TROPONINI in the last 168 hours. BNP (last 3 results) No results for input(s): PROBNP in the last 8760 hours. HbA1C: No results for input(s): HGBA1C in the last 72 hours. CBG: No results for input(s): GLUCAP in the last 168 hours. Lipid Profile: No results for input(s): CHOL, HDL, LDLCALC, TRIG, CHOLHDL, LDLDIRECT in the last 72 hours. Thyroid Function  Tests: No results for input(s): TSH, T4TOTAL, FREET4, T3FREE, THYROIDAB in the last 72 hours. Anemia Panel: No results for input(s): VITAMINB12, FOLATE, FERRITIN, TIBC, IRON, RETICCTPCT in the last 72 hours. Urine analysis:    Component Value Date/Time   COLORURINE AMBER (A) 04/12/2018 0056   APPEARANCEUR CLEAR 04/12/2018 0056   LABSPEC 1.019 04/12/2018 0056   PHURINE 5.0 04/12/2018 0056   GLUCOSEU NEGATIVE 04/12/2018 0056   HGBUR NEGATIVE 04/12/2018 0056   BILIRUBINUR NEGATIVE 04/12/2018 0056   KETONESUR 20 (A) 04/12/2018 0056   PROTEINUR 30 (A) 04/12/2018 0056   NITRITE NEGATIVE 04/12/2018 0056   LEUKOCYTESUR NEGATIVE 04/12/2018 0056    Radiological Exams on Admission: Ct Soft Tissue Neck W  Contrast  Result Date: 04/12/2018 CLINICAL DATA:  Wheezing.  History of sarcoidosis and asthma. EXAM: CT NECK WITH CONTRAST TECHNIQUE: Multidetector CT imaging of the neck was performed using the standard protocol following the bolus administration of intravenous contrast. CONTRAST:  71mL OMNIPAQUE IOHEXOL 300 MG/ML  SOLN COMPARISON:  CT chest November 03, 2016 FINDINGS: Noisy image quality due to habitus, shoulders at the level of the upper neck. Mild motion degraded examination. PHARYNX AND LARYNX: Normal.  Widely patent airway. SALIVARY GLANDS: Normal. THYROID: Normal. LYMPH NODES: No lymphadenopathy by CT size criteria. VASCULAR: Moderate calcific atherosclerosis carotid siphon. Mild calcific atherosclerosis carotid bifurcations. LIMITED INTRACRANIAL: Normal. VISUALIZED ORBITS: Normal. MASTOIDS AND VISUALIZED PARANASAL SINUSES: Bilateral mastoid effusions without air cell coalescence. SKELETON: Nonacute.  Calcified stylohyoid ligaments. UPPER CHEST: Small RIGHT pleural effusion with dense consolidation with some component of bronchiectasis. Calcified mediastinal lymphadenopathy. OTHER: Trace retropharyngeal effusion versus artifact. IMPRESSION: 1. Habitus limited examination. Trace retropharyngeal  effusion versus artifact. Patent airway. 2. RIGHT pleural effusion with consolidation and bronchiectasis, incompletely evaluated. Calcified mediastinal lymphadenopathy. Electronically Signed   By: Elon Alas M.D.   On: 04/12/2018 02:43   Dg Chest Port 1 View  Result Date: 04/12/2018 CLINICAL DATA:  Patient being treated for sepsis. Fever. History of sarcoid. EXAM: PORTABLE CHEST 1 VIEW COMPARISON:  03/05/2018 and 03/19/2018 FINDINGS: Diffuse coarsened interstitial lung markings are again noted of the right lung and left lung base. Heart size is top normal with aortic atherosclerosis. Along the periphery of the right hemithorax is slightly more confluent and minimally more prominent opacity that may reflect pleural small loculated effusion. Otherwise, no significant change. IMPRESSION: Slightly more prominent opacity along the periphery of the right upper thorax that may reflect a loculated pleural effusion. Coarsened interstitial lung markings involving much of the right lung and left lung base persist and likely reflect the patient's underlying history of sarcoid. Electronically Signed   By: Ashley Royalty M.D.   On: 04/12/2018 00:49    EKG: Independently reviewed.  Assessment/Plan Principal Problem:   Sepsis ([place]) Active Problems:   Diabetes mellitus without complication ([place])   Sarcoidosis   CKD (chronic kidney disease), stage III ([place])    1. Sepsis - 1. Possible sources include: 1. Viral URI or strep throat 2. Obstructing stone (though no flank pain) 3. UA not impressive, but wasn't really impressive last time either with UTI. 4. Loculated pleural effusion on right? 2. Strep test 3. CT chest w contrast to eval pleural effusion, also there was apparently some concern for possible SVC syndrome from what the EDP told me about his conversation with radiologist. 4. UCx 5. BCx 6. IVF: NS at 125 cc/hr 7. Tylenol PRN fever 8. Empiric cefepime / flagyl / vanc for this patient on chronic  immunosuppressants (steroids) 9. US renal 10. Increase steroids to stress dose: Solucortef 50mg  Q8H given initial hypotension, chronic steroid use, N/V. 2. Sarcoidosis - 1. Stress dose steroids for the moment as above 3. CKD stage 3 - 1. Chronic and baseline at the moment 2. Monitor BMPs during admit 4. DM2 - 1. Appears to be diet controlled 2. Will put patient on mod scale SSI AC since we are increasing steroids to stress dose.  DVT prophylaxis: Lovenox Code Status: Full Family Communication: No family in room Disposition Plan: Home after admit Consults called: None Admission status: Place in obs - likely to convert to [place], [place] Hospitalists  How to contact the [facility] Attending or  Consulting provider [place] or covering provider during after hours [place], for this patient?  1. Check the care team in [facility] and look for a) attending/consulting [facility] provider listed and b) the [facility] team listed 2. Log into [email or web address]  Amion Physician Scheduling and messaging for groups and whole hospitals  On call and physician scheduling software for group practices, residents, hospitalists and other medical providers for call, clinic, rotation and shift schedules. OnCall [employer] is a hospital-wide system for scheduling doctors and paging doctors on call. EasyPlot is for scientific plotting and data analysis.  [email or web address]  and use [place]'s universal password to access. If you do not have the password, please contact the hospital operator.  3. Locate the [facility] provider you are looking for under [facility] and page to a number that you can be directly reached. 4. If you still have difficulty reaching the provider, please page the [facility] (Director on Call) for the Hospitalists listed on amion for assistance.  04/12/2018, 4:09 AM

## 2018-04-12 NOTE — Progress Notes (Signed)
I agree with the history and physical and plan as per Dr. Alcario Drought who saw this patient earlier this morning-in addition my thoughts are as follows  68 year old African-American female-history of sarcoid on chronic prednisone with recent wean by pulmonology 03/17/2018, asthma, CKD stage IV, gout, morbid obesity prior stroke without deficit, HTN, DM TY 2  Recent admission 2/14-2/17 with sepsis secondary to E. coli bacteremia placed on Rocephin and discharged Returns to ED  T-max 102.2 heart rate 107 BP 93/57  Treated with broad-spectrum Vanco Flagyl cefepime-chest x-ray scarring-CT showed sarcoidosis with pleural and parenchymal thickening increasing density right upper lobe possible superimposed pneumonia Very scarred right kidney with multiple calculi with a 6 mm stone right renal pelvis no hydronephrosis  BUN/creatinine at baseline 17/1.8-->currently 5/1.3 White count 4.4 Hemoglobin 9.1 platelet 274 Lactic acid 1.1 Procalcitonin 0.1  BP 113/73   Pulse 84   Temp 98.1 F (36.7 C) (Oral)   Resp 18   Ht 5\' 1"  (1.549 m)   Wt 113 kg   SpO2 93%   BMI 47.07 kg/m   On exam she is somnolent but arousable and oriented to place time person knows where she is and can tell me that she lives with her granddaughter I do not notice any focal deficit although her mucosa is dry She does not complain of any pain other than when she passes urine She is not noticed any blood No vomiting No fever at this time She still is somewhat sleepy by the end of our interaction but is arousable enough that I think she can have a diet  Plan I called patient's granddaughter on the phone [phone] although she does state that her son lives next can she lives with her granddaughter-I did not get a response   We will continue to treat for probable sources of infection although she does have a 6 mm stone-I will add some Flomax to see if this helps pass the stone if she does not have recurrent fever she will need  outpatient management once we can determine if her urine is the source which I think it might be I would not narrow antibiotics for now other than await blood and urine cultures and make a decision subsequently

## 2018-04-12 NOTE — ED Notes (Signed)
Assessed pt's left arm from contrast dye extravasation Pt arm swollen, applied ice pack, pt denies pain at this time.   Will continue to monitor.

## 2018-04-12 NOTE — ED Notes (Addendum)
Pt recommended by PCP for Home oxygen.   When pt removes her oxygen, she desats in the 80's.  Decreased oxygen to 2L [place].

## 2018-04-12 NOTE — ED Notes (Signed)
Patient refused IV teams second try at IV

## 2018-04-12 NOTE — ED Notes (Signed)
Breakfast Tray Ordered. 

## 2018-04-13 DIAGNOSIS — R410 Disorientation, unspecified: Secondary | ICD-10-CM

## 2018-04-13 DIAGNOSIS — J189 Pneumonia, unspecified organism: Secondary | ICD-10-CM

## 2018-04-13 LAB — BASIC METABOLIC PANEL
Anion gap: 15 (ref 5–15)
BUN: 7 mg/dL — ABNORMAL LOW (ref 8–23)
CO2: 17 mmol/L — ABNORMAL LOW (ref 22–32)
Calcium: 7.7 mg/dL — ABNORMAL LOW (ref 8.9–10.3)
Chloride: 105 mmol/L (ref 98–111)
Creatinine, Ser: 1.57 mg/dL — ABNORMAL HIGH (ref 0.44–1.00)
GFR calc Af Amer: 39 mL/min — ABNORMAL LOW (ref >60)
GFR, EST NON AFRICAN AMERICAN: 34 mL/min — AB (ref >60)
Glucose, Bld: 146 mg/dL — ABNORMAL HIGH (ref 70–99)
Potassium: 4.6 mmol/L (ref 3.5–5.1)
SODIUM: 137 mmol/L (ref 135–145)

## 2018-04-13 LAB — CBC WITH DIFFERENTIAL/PLATELET
Abs Immature Granulocytes: 0.06 10*3/uL (ref 0.00–0.07)
Basophils Absolute: 0 10*3/uL (ref 0.0–0.1)
Basophils Relative: 1 %
Eosinophils Absolute: 0 10*3/uL (ref 0.0–0.5)
Eosinophils Relative: 0 %
HCT: 28.1 % — ABNORMAL LOW (ref 36.0–46.0)
Hemoglobin: 8.5 g/dL — ABNORMAL LOW (ref 12.0–15.0)
Immature Granulocytes: 1 %
Lymphocytes Relative: 8 %
Lymphs Abs: 0.5 10*3/uL — ABNORMAL LOW (ref 0.7–4.0)
MCH: 28.4 pg (ref 26.0–34.0)
MCHC: 30.2 g/dL (ref 30.0–36.0)
MCV: 94 fL (ref 80.0–100.0)
Monocytes Absolute: 0.3 10*3/uL (ref 0.1–1.0)
Monocytes Relative: 4 %
Neutro Abs: 5.6 10*3/uL (ref 1.7–7.7)
Neutrophils Relative %: 86 %
Platelets: 498 10*3/uL — ABNORMAL HIGH (ref 150–400)
RBC: 2.99 MIL/uL — ABNORMAL LOW (ref 3.87–5.11)
RDW: 15.3 % (ref 11.5–15.5)
WBC: 6.4 10*3/uL (ref 4.0–10.5)
nRBC: 0 % (ref 0.0–0.2)

## 2018-04-13 LAB — URINE CULTURE: Culture: NO GROWTH

## 2018-04-13 LAB — GLUCOSE, CAPILLARY
Glucose-Capillary: 149 mg/dL — ABNORMAL HIGH (ref 70–99)
Glucose-Capillary: 151 mg/dL — ABNORMAL HIGH (ref 70–99)

## 2018-04-13 LAB — MRSA PCR SCREENING: MRSA by PCR: POSITIVE — AB

## 2018-04-13 MED ORDER — SODIUM CHLORIDE 0.9 % IV SOLN
1000.0000 mL | INTRAVENOUS | Status: DC
  Administered 2018-04-14: 1000 mL via INTRAVENOUS

## 2018-04-13 MED ORDER — SODIUM CHLORIDE 0.9 % IV SOLN
1000.0000 mL | INTRAVENOUS | Status: DC
  Administered 2018-04-13: 1000 mL via INTRAVENOUS

## 2018-04-13 MED ORDER — HYDROCORTISONE NA SUCCINATE PF 100 MG IJ SOLR
25.0000 mg | Freq: Three times a day (TID) | INTRAMUSCULAR | Status: DC
  Administered 2018-04-13 – 2018-04-14 (×3): 25 mg via INTRAVENOUS
  Filled 2018-04-13 (×3): qty 2

## 2018-04-13 MED ORDER — SODIUM CHLORIDE 0.9 % IV SOLN
2.0000 g | INTRAVENOUS | Status: DC
  Administered 2018-04-14 – 2018-04-15 (×2): 2 g via INTRAVENOUS
  Filled 2018-04-13 (×2): qty 2

## 2018-04-13 MED ORDER — MUPIROCIN 2 % EX OINT
TOPICAL_OINTMENT | Freq: Two times a day (BID) | CUTANEOUS | Status: DC
  Administered 2018-04-13 – 2018-04-17 (×8): via NASAL
  Administered 2018-04-17 – 2018-04-18 (×2): 1 via NASAL
  Administered 2018-04-18 – 2018-04-19 (×3): via NASAL
  Filled 2018-04-13 (×3): qty 22

## 2018-04-13 NOTE — Progress Notes (Signed)
Care plan had reviewed. Observed Pt last night, she was alert and oriented x 4 ,but delayed responsiveness and unclear thinking and limited cognitive level. She repeated herself that she is 69 years old.However, she cooperated well with staff. She slept well last night. No pain.  Respiration at night:  Room air SPO2: 79-81% ( mouth breather with snoring) 3 LPM of O2 NCL SPO2 96-100% with head of bed >30 degree. Auscultated: right lung had rhonchi with fine crackle, left lung had clear breathe sounds. Strong with non productive cough, minimal clear/white sputum.  EKG: sinus rhythm on monitor,  HR 80s-90s  BP 118-125/61-63 mmHg, Temp:afebrile 97.6-98.2 F RR 18-34 Stable and no acute distress had seen.   She has been NPO due to swallowing compromised in ED yesterday, awaiting for SLP evaluation this morning.Continue IV fluid 0.9% NSS 125 ml/hr for hydration.  Elimination: Continually using Purewick, 24 hours urine out put 550 ml, dark Zeller color, urine culture pending. LBM 04/12/2018, stated by Pt.  Skin integrity: Moisture associated with skin damaged under buttock area, cleaned and foam applied per protocol.   Continue to monitor. Kennyth Lose, BSN,RN,PCCN-CMC-CSC

## 2018-04-13 NOTE — Progress Notes (Signed)
Physical Therapy Evaluation Patient Details Name: Darlene Stafford MRN: [id number] DOB: [birth year] Today's Date: 04/13/2018   History of Present Illness  Patient is 68 y/o female admitted to hospital with fever and fatigue. Patient with sepsis secondary to pneumonia. Imaging also revealed R renal scarring. PMH includes DM, sarcoidosis, CVA and anemia.  Clinical Impression  Patient admitted to hospital secondary to problems above and with deficits below. Patient required modA to perform transfers with RW. No family or caregiver present during session and unsure of PLOF and home information secondary to cognitive impairments. Given functional mobility and cognitive deficits, recommending SNF level therapy following d/c. Patient will benefit from acute physical therapy to maximize independence and safety with functional mobility.     Follow Up Recommendations SNF;Supervision/Assistance - 24 hour    Equipment Recommendations  Other (comment)(TBD at next venue)    Recommendations for Other Services       Precautions / Restrictions Precautions Precautions: Fall Restrictions Weight Bearing Restrictions: No      Mobility  Bed Mobility Overal bed mobility: Needs Assistance Bed Mobility: Supine to Sit     Supine to sit: Supervision     General bed mobility comments: Patient required supervision for bed mobility. Required increased time to sit EOB. Verbal cues for usage of rails to assist sitting up.   Transfers Overall transfer level: Needs assistance Equipment used: Rolling walker (2 wheeled) Transfers: Sit to/from [facility] Sit to Stand: Mod assist Stand pivot transfers: Mod assist       General transfer comment: Patient required modA for lift assist to stand x2 with RW. Verbal cues for hand placement prior to standing when using RW. Required modA for steadying for stand pivot transfer. Required increased time during mobility. Required verbal cues for sequencing  with RW.   Ambulation/Gait                Stairs            Wheelchair Mobility    Modified Rankin (Stroke Patients Only)       Balance Overall balance assessment: Needs assistance Sitting-balance support: Feet supported;Bilateral upper extremity supported Sitting balance-Leahy Scale: Poor Sitting balance - Comments: reliant on BUE support for sitting balance   Standing balance support: Bilateral upper extremity supported Standing balance-Leahy Scale: Poor Standing balance comment: reliant on BUE support to maintain standing balance                             Pertinent Vitals/Pain Pain Assessment: Faces Faces Pain Scale: No hurt    Home Living Family/patient expects to be discharged to:: Private residence Living Arrangements: Other relatives Available Help at Discharge: Family;Available 24 hours/day Type of Home: House Home Access: Level entry     Home Layout: One level Home Equipment: Walker - 2 wheels;Wheelchair - manual Additional Comments: unsure of accuracy of home information     Prior Function Level of Independence: Needs assistance   Gait / Transfers Assistance Needed: Reports using a RW for household ambulation. States uses a w/c when out in community.   ADL's / Homemaking Assistance Needed: reports requiring assistance with ADLs  Comments: unsure of accuracy of PLOF     Hand Dominance        Extremity/Trunk Assessment   Upper Extremity Assessment Upper Extremity Assessment: Generalized weakness    Lower Extremity Assessment Lower Extremity Assessment: Generalized weakness    Cervical / Trunk Assessment Cervical / Trunk Assessment: Kyphotic  Communication   Communication: Expressive difficulties  Cognition Arousal/Alertness: Awake/alert Behavior During Therapy: Flat affect Overall Cognitive Status: No family/caregiver present to determine baseline cognitive functioning                                  General Comments: Patient only oriented to self. Required increased time and slowed processing noted when following commands. Responded to all questions with "uh huh"       General Comments General comments (skin integrity, edema, etc.): No family caregiver present during session.     Exercises     Assessment/Plan    PT Assessment Patient needs continued PT services  PT Problem List Decreased strength;Decreased range of motion;Decreased activity tolerance;Decreased balance;Decreased mobility;Decreased cognition;Decreased knowledge of use of DME       PT Treatment Interventions DME instruction;Gait training;Functional mobility training;Therapeutic activities;Therapeutic exercise;Balance training;Patient/family education;Cognitive remediation    PT Goals (Current goals can be found in the Care Plan section)  Acute Rehab PT Goals PT Goal Formulation: Patient unable to participate in goal setting Time For Goal Achievement: 04/27/18 Potential to Achieve Goals: Fair    Frequency Min 2X/week   Barriers to discharge        Co-evaluation               AM-PAC PT "6 Clicks" Mobility  Outcome Measure Help needed turning from your back to your side while in a flat bed without using bedrails?: A Little Help needed moving from lying on your back to sitting on the side of a flat bed without using bedrails?: A Little Help needed moving to and from a bed to a chair (including a wheelchair)?: A Lot Help needed standing up from a chair using your arms (e.g., wheelchair or bedside chair)?: A Lot Help needed to walk in hospital room?: Total Help needed climbing 3-5 steps with a railing? : Total 6 Click Score: 12    End of Session Equipment Utilized During Treatment: Gait belt Activity Tolerance: Patient tolerated treatment well Patient left: in chair;with call bell/phone within reach;with chair alarm set Nurse Communication: Mobility status PT Visit Diagnosis: Other abnormalities of  gait and mobility (R26.89);Muscle weakness (generalized) (M62.81);Difficulty in walking, not elsewhere classified (R26.2)    Time: 1610-9604 PT Time Calculation (min) (ACUTE ONLY): 29 min   Charges:   PT Evaluation $PT Eval Moderate Complexity: 1 Mod PT Treatments $Therapeutic Activity: 8-22 mins        Erick Blinks, SPT  Erick Blinks 04/13/2018, 4:30 PM

## 2018-04-13 NOTE — Evaluation (Signed)
Clinical/Bedside Swallow Evaluation Patient Details  Name: Darlene Stafford MRN: [id number] Date of Birth: [birth year]  Today's Date: 04/13/2018 Time: SLP Start Time (ACUTE ONLY): 0848 SLP Stop Time (ACUTE ONLY): 0906 SLP Time Calculation (min) (ACUTE ONLY): 18 min  Past Medical History:  Past Medical History:  Diagnosis Date  . Asthma   . CVA (cerebral vascular accident) ([place])   . Diabetes mellitus without complication ([place])   . Sarcoidosis of other sites    Ocular   Past Surgical History:  Past Surgical History:  Procedure Laterality Date  . PARTIAL HYSTERECTOMY  1998  . REPLACEMENT TOTAL KNEE  2015   HPI:  Pt is a 68 yo female who presents with fever, sore throat, and emesis. CT chest showed possible PNA ni the RUL. She also had recent admission (2/14-2/17) for UTI. PMH: CVA, sarcoidosis, DM2, asthma   Assessment / Plan / Recommendation Clinical Impression  Pt's swallowing appears to be largely impacted by her mentation, requiring Mod cues for sustained attention. She repeatedly tells me that she was told in the emergency room that she needs to have a tooth pulled, but cannot tell me any additional details. H&P note says normal dentition, and pt does not open her mouth enough for me to look clearly inside. Oral holding and intermittent anterior loss is observed during PO intake. She puts bites of soft solids in her mouth and moves them around with her fingers, but does not make attempts to masticate until cued to do so. Even still, mastication is prolonged and she needs multiple liquid washes to clear diffuse residuals. Frequent eructation is noted, which also raises the question of possible esophageal component. Despite the above, there are no overt signs of aspiration. Recommend starting Dys 1 diet and thin liquids. SLP to f/u for tolerance and potential for diet advancement pending improvements in mentation. SLP Visit Diagnosis: Dysphagia, unspecified (R13.10)    Aspiration Risk  Mild  aspiration risk;Moderate aspiration risk    Diet Recommendation Dysphagia 1 (Puree);Thin liquid   Liquid Administration via: Cup;Straw Medication Administration: Whole meds with puree Supervision: Patient able to self feed;Full supervision/cueing for compensatory strategies Compensations: Slow rate;Small sips/bites;Follow solids with liquid Postural Changes: Seated upright at 90 degrees;Remain upright for at least 30 minutes after po intake    Other  Recommendations Oral Care Recommendations: Oral care BID   Follow up Recommendations (tba)      Frequency and Duration min 2x/week  2 weeks       Prognosis Prognosis for Safe Diet Advancement: Good Barriers to Reach Goals: Cognitive deficits      Swallow Study   General HPI: Pt is a 68 yo female who presents with fever, sore throat, and emesis. CT chest showed possible PNA ni the RUL. She also had recent admission (2/14-2/17) for UTI. PMH: CVA, sarcoidosis, DM2, asthma Type of Study: Bedside Swallow Evaluation Previous Swallow Assessment: none in chart Diet Prior to this Study: NPO Temperature Spikes Noted: No Respiratory Status: Nasal cannula History of Recent Intubation: No Behavior/Cognition: Alert;Cooperative;Confused;Requires cueing Oral Cavity Assessment: Dry(difficult to see - reduced opening) Oral Care Completed by SLP: No Oral Cavity - Dentition: Adequate natural dentition;Missing dentition;Other (Comment)(pt reports she has a tooth that needs to be pulled) Vision: Functional for self-feeding Self-Feeding Abilities: Able to feed self Patient Positioning: Upright in bed Baseline Vocal Quality: Normal Volitional Cough: Strong Volitional Swallow: Unable to elicit    Oral/Motor/Sensory Function Overall Oral Motor/Sensory Function: Other (comment)(difficulty following commands to assess)   [employer]  chips: Impaired Presentation: Spoon Oral Phase Functional Implications: Oral holding   Thin Liquid Thin Liquid:  Impaired Presentation: Cup;Self Fed;Straw Oral Phase Impairments: Reduced labial seal Oral Phase Functional Implications: Right anterior spillage;Oral holding    Nectar Thick Nectar Thick Liquid: Not tested   Honey Thick Honey Thick Liquid: Not tested   Puree Puree: Impaired Presentation: Spoon Oral Phase Impairments: Poor awareness of bolus Oral Phase Functional Implications: Oral holding   Solid     Solid: Impaired Oral Phase Impairments: Impaired mastication;Poor awareness of bolus Oral Phase Functional Implications: Oral residue      Venita Sheffield Greidy Sherard 04/13/2018,9:17 AM  Pollyann Glen, M.A. [facility] education officer [phone] Office [phone]

## 2018-04-13 NOTE — Progress Notes (Addendum)
[facility] Progress Note  Subjective: pt confused, pleasant, pt's niece is at bedside , states she is off mentally a bit from baseline.    Vitals:   04/13/18 0322 04/13/18 0534 04/13/18 0536 04/13/18 0747  BP: 118/61   103/69  Pulse: 90 87 87 94  Resp: (!) 23 (!) 23 19 (!) 24  Temp: 98.2 F (36.8 C)   (!) 97.3 F (36.3 C)  TempSrc: Oral   Oral  SpO2: 100% (!) 81% (!) 79% 100%  Weight:      Height:        Inpatient medications: . atorvastatin  40 mg Oral Daily  . enoxaparin (LOVENOX) injection  40 mg Subcutaneous Q24H  . hydrocortisone sod succinate (SOLU-CORTEF) inj  50 mg Intravenous Q8H  . insulin aspart  0-15 Units Subcutaneous TID WC   . sodium chloride 1,000 mL (04/13/18 0845)  . [START ON 04/14/2018] ceFEPime (MAXIPIME) IV    . metronidazole 500 mg (04/13/18 0419)  . [START ON 04/14/2018] vancomycin     acetaminophen **OR** acetaminophen, albuterol, ondansetron **OR** ondansetron (ZOFRAN) IV  Exam: Constitutional: NAD, calm, comfortable, elderly AAF, drowsy but awake. Slow speech, disorientated but knows year/ president, not place.  Neck: normal, supple, no masses, no thyromegaly Respiratory: some crackles R base, o/w clear Cardiovascular: RRR no MRG.  No LE edema.  Abdomen: soft, nontender, no mass or HSM. Bowel sounds+  Musculoskeletal: no clubbing / cyanosis. No joint deformity upper and lower extremities. Good ROM, no contractures. Normal muscle tone.  Skin: no rashes, lesions, ulcers. No induration Neurologic: CN 2-12 grossly intact. Gen'd weakness.  Did not try to get up. Alert and oriented x 2. Normal mood.     Presentation Summary: Darlene Stafford is a 68 y.o. female with medical history significant of CVA, Sarcoidosis on chronic prednisone, DM2.  Patient was recently admitted from 2/14 to 2/17 with sepsis secondary to UTI.  Urine ultimately grew E.Coli resistant to cipro.  Treated with broad spectrum ABx during admission, stress dose steroids due to  hypotension, and ultimately sent home with keflex. Today she returns to the ED with 2 day h/o fever and fatigue.  Several episodes of NBNB emesis, sore throat and feeling of throat swelling.  Denies any Abd pain, headache, meningismus, flank or back pain, cough, SOB. Does have mild diarrhea.  ED Course: Tm 102.2, HR 107, RR 30, initial BP 93/57.  Given 2L NS, Tylenol, cefepime, vanc, flagyl.  Influenza neg.  UA neg.  CT neck: ? Loculated pleural effusion on Right?  CXR shows a bunch of scarring in the R lung that's felt to be old (and appears on all the other chest imaging).  Vitals improved after interventions.       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 Chest CT suggests this. Other possibilities are viral syndrome/ URI, strep throat.  - blood and urine cx's are negative - confusion mild , prob due to sepsis, improving - high fever 103 on admit, may be improving - continue IV abx for PNA (vanc/ cefepime/ flagyl) - decrease IVF 65/ hr  Sarcoidosis -  - cont IV stress dose steroids for another 24hrs then change back to po if stabilizing  CKD stage 3  - baseline creat 1.3- 1.9. At baseline - atrophic R kidney 5 cm and 10 cm L kidney , no hydro , by [place]; these results are not much changed  from march 2019 renal US - low CO2, add bicarb bid   Anemia normocytic - get anemia panel. Hb 8.5.   DM2 - appears to be diet controlled - pt states she is not diabetic and is refusing finger sticks and prn insulin >> will dc SSI  Acute on Chronic debility:  - at baseline uses walker or WC, needs help w/ ADL's, wear adult pull-ups at home. Lives w/ family - will consult CM for assist w/ dc planning   DVT prophylaxis: Lovenox Code Status: Full Family Communication: Niece in room Disposition Plan: Home but if debility worsens may need SNF Consults called: None Admission status:  OBS    Kelly Splinter / [facility] [phone] 04/13/2018, 1:20 PM   Recent Labs  Lab 04/11/18 2331 04/11/18 2342 04/12/18 0517 04/13/18 0319  NA 135 136 137 137  K 3.1* 3.2* 3.4* 4.6  CL 92*  --  102 105  CO2 26  --  21* 17*  GLUCOSE 84  --  78 146*  BUN <5*  --  5* 7*  CREATININE 1.33*  --  1.33* 1.57*  CALCIUM 8.6*  --  7.5* 7.7*   Recent Labs  Lab 04/11/18 2331  AST 20  ALT 7  ALKPHOS 72  BILITOT 2.4*  PROT 7.2  ALBUMIN 3.1*   Recent Labs  Lab 04/11/18 2331 04/11/18 2342 04/12/18 0517 04/13/18 0319  WBC 4.8  --  4.4 6.4  NEUTROABS 2.4  --   --  5.6  HGB 10.0* 10.9* 9.1* 8.5*  HCT 33.2* 32.0* 31.7* 28.1*  MCV 92.5  --  98.8 94.0  PLT 506*  --  274 498*   Iron/TIBC/Ferritin/ %Sat No results found for: IRON, TIBC, FERRITIN, IRONPCTSAT

## 2018-04-14 ENCOUNTER — Inpatient Hospital Stay (HOSPITAL_COMMUNITY): Payer: Medicare Other

## 2018-04-14 ENCOUNTER — Other Ambulatory Visit: Payer: Self-pay

## 2018-04-14 ENCOUNTER — Encounter (HOSPITAL_COMMUNITY): Payer: Self-pay | Admitting: Neurology

## 2018-04-14 DIAGNOSIS — L8990 Pressure ulcer of unspecified site, unspecified stage: Secondary | ICD-10-CM

## 2018-04-14 LAB — RETICULOCYTES
IMMATURE RETIC FRACT: 20 % — AB (ref 2.3–15.9)
RBC.: 2.57 MIL/uL — ABNORMAL LOW (ref 3.87–5.11)
Retic Count, Absolute: 125.7 10*3/uL (ref 19.0–186.0)
Retic Ct Pct: 4.9 % — ABNORMAL HIGH (ref 0.4–3.1)

## 2018-04-14 LAB — CBC
HCT: 24.6 % — ABNORMAL LOW (ref 36.0–46.0)
Hemoglobin: 7.4 g/dL — ABNORMAL LOW (ref 12.0–15.0)
MCH: 27.9 pg (ref 26.0–34.0)
MCHC: 30.1 g/dL (ref 30.0–36.0)
MCV: 92.8 fL (ref 80.0–100.0)
Platelets: 468 10*3/uL — ABNORMAL HIGH (ref 150–400)
RBC: 2.65 MIL/uL — ABNORMAL LOW (ref 3.87–5.11)
RDW: 15.4 % (ref 11.5–15.5)
WBC: 7.1 10*3/uL (ref 4.0–10.5)
nRBC: 0 % (ref 0.0–0.2)

## 2018-04-14 LAB — VITAMIN B12: Vitamin B-12: 1155 pg/mL — ABNORMAL HIGH (ref 180–914)

## 2018-04-14 LAB — IRON AND TIBC
IRON: 86 ug/dL (ref 28–170)
Saturation Ratios: 45 % — ABNORMAL HIGH (ref 10.4–31.8)
TIBC: 192 ug/dL — ABNORMAL LOW (ref 250–450)
UIBC: 106 ug/dL

## 2018-04-14 LAB — BASIC METABOLIC PANEL
Anion gap: 11 (ref 5–15)
BUN: 6 mg/dL — ABNORMAL LOW (ref 8–23)
CO2: 21 mmol/L — ABNORMAL LOW (ref 22–32)
Calcium: 7.9 mg/dL — ABNORMAL LOW (ref 8.9–10.3)
Chloride: 108 mmol/L (ref 98–111)
Creatinine, Ser: 1.27 mg/dL — ABNORMAL HIGH (ref 0.44–1.00)
GFR calc Af Amer: 51 mL/min — ABNORMAL LOW (ref >60)
GFR calc non Af Amer: 44 mL/min — ABNORMAL LOW (ref >60)
Glucose, Bld: 152 mg/dL — ABNORMAL HIGH (ref 70–99)
POTASSIUM: 4.1 mmol/L (ref 3.5–5.1)
Sodium: 140 mmol/L (ref 135–145)

## 2018-04-14 LAB — FOLATE: Folate: 5.9 ng/mL — ABNORMAL LOW (ref >5.9)

## 2018-04-14 LAB — FERRITIN: FERRITIN: 559 ng/mL — AB (ref 11–307)

## 2018-04-14 LAB — GLUCOSE, CAPILLARY: Glucose-Capillary: 146 mg/dL — ABNORMAL HIGH (ref 70–99)

## 2018-04-14 MED ORDER — MONTELUKAST SODIUM 10 MG PO TABS
10.0000 mg | ORAL_TABLET | Freq: Every day | ORAL | Status: DC
  Administered 2018-04-14 – 2018-04-19 (×6): 10 mg via ORAL
  Filled 2018-04-14 (×6): qty 1

## 2018-04-14 MED ORDER — ALBUTEROL SULFATE (2.5 MG/3ML) 0.083% IN NEBU
2.5000 mg | INHALATION_SOLUTION | Freq: Two times a day (BID) | RESPIRATORY_TRACT | Status: DC
  Administered 2018-04-14 – 2018-04-15 (×2): 2.5 mg via RESPIRATORY_TRACT
  Filled 2018-04-14 (×2): qty 3

## 2018-04-14 MED ORDER — CARVEDILOL 12.5 MG PO TABS
12.5000 mg | ORAL_TABLET | Freq: Two times a day (BID) | ORAL | Status: DC
  Administered 2018-04-14 – 2018-04-20 (×14): 12.5 mg via ORAL
  Filled 2018-04-14 (×14): qty 1

## 2018-04-14 MED ORDER — METRONIDAZOLE 500 MG PO TABS
500.0000 mg | ORAL_TABLET | Freq: Three times a day (TID) | ORAL | Status: DC
  Administered 2018-04-14 – 2018-04-17 (×9): 500 mg via ORAL
  Filled 2018-04-14 (×9): qty 1

## 2018-04-14 MED ORDER — FERROUS SULFATE 325 (65 FE) MG PO TABS
325.0000 mg | ORAL_TABLET | Freq: Two times a day (BID) | ORAL | Status: DC
  Administered 2018-04-14 – 2018-04-20 (×12): 325 mg via ORAL
  Filled 2018-04-14 (×13): qty 1

## 2018-04-14 MED ORDER — ALBUTEROL SULFATE (2.5 MG/3ML) 0.083% IN NEBU
2.5000 mg | INHALATION_SOLUTION | Freq: Three times a day (TID) | RESPIRATORY_TRACT | Status: DC
  Administered 2018-04-14: 2.5 mg via RESPIRATORY_TRACT
  Filled 2018-04-14: qty 3

## 2018-04-14 MED ORDER — FOLIC ACID 1 MG PO TABS
1.0000 mg | ORAL_TABLET | Freq: Every day | ORAL | Status: DC
  Administered 2018-04-15 – 2018-04-20 (×5): 1 mg via ORAL
  Filled 2018-04-14 (×7): qty 1

## 2018-04-14 MED ORDER — PREDNISONE 20 MG PO TABS
40.0000 mg | ORAL_TABLET | Freq: Every day | ORAL | Status: DC
  Administered 2018-04-15: 40 mg via ORAL
  Filled 2018-04-14: qty 2

## 2018-04-14 NOTE — Progress Notes (Signed)
  Speech Language Pathology Treatment: Dysphagia  Patient Details Name: Darlene Stafford MRN: [id number] DOB: 11-09-1950 Today's Date: 04/14/2018 Time: 1791-5056 SLP Time Calculation (min) (ACUTE ONLY): 20 min  Assessment / Plan / Recommendation Clinical Impression  Pt was seen for skilled ST targeting dysphagia goals.  Pt was sleeping upon therapist's arrival and was confused upon awakening.  Pt needed max to total cues to reorient to place.  She was initially very mistrusting of therapist and did not want to work with therapist; however, with gentle encouragement and reassurance pt was agreeable to participating in therapy.  Pt consumed thin liquids without overt s/s of aspiration but declined any purees or trials of solids.  Nursing reports that pt was much clearer earlier in the day and that her current presentation is vastly different than this morning.  Previous SLP notes indicate significant confusion during yesterday's evaluation.  Will continue to follow along for readiness to advance but for now would recommend that pt remain on currently prescribed diet.  Pt left in bed with nursing and son at bedside.  Continue per current plan of care.    HPI HPI: Pt is a 68 yo female who presents with fever, sore throat, and emesis. CT chest showed possible PNA ni the RUL. She also had recent admission (2/14-2/17) for UTI. PMH: CVA, sarcoidosis, DM2, asthma      SLP Plan  Continue with current plan of care       Recommendations  Diet recommendations: Dysphagia 1 (puree);Thin liquid Liquids provided via: Cup;Straw Medication Administration: Whole meds with puree Supervision: Patient able to self feed;Full supervision/cueing for compensatory strategies Compensations: Slow rate;Small sips/bites;Follow solids with liquid Postural Changes and/or Swallow Maneuvers: Seated upright 90 degrees;Upright 30-60 min after meal                Oral Care Recommendations: Oral care BID Follow up  Recommendations: Home health SLP SLP Visit Diagnosis: Dysphagia, unspecified (R13.10) Plan: Continue with current plan of care       GO                PageSelinda Orion 04/14/2018, 4:16 PM

## 2018-04-14 NOTE — Progress Notes (Signed)
Patietn more alert and orin. This a.m. Pleasant and awake

## 2018-04-14 NOTE — Progress Notes (Addendum)
PROGRESS NOTE    Darlene Stafford  EVO:[id number] DOB: 02/16/1950 DOA: 04/11/2018 PCP: Katherina Mires, MD    Brief Narrative 68 year old with past medical history significant for sarcoid, on chronic prednisone, recently weaned by pulmonologist of prednisone on 01/15/2019, asthma, chronic kidney disease stage IV, gout, morbid obesity, prior stroke diabetes recent hospitalization for sepsis secondary to E. coli bacteremia on 2/14 to 2/17.  Who presents to the emergency department with fever soft blood pressure, diagnosed with pneumonia.  Assessment & Plan:   Principal Problem:   Sepsis ([place]) Active Problems:   Diabetes mellitus without complication ([place])   Sarcoidosis   CKD (chronic kidney disease), stage III ([place])   Pressure injury of skin    Sepsis/PNA; patient presents with fever, cough.  CT with groundglass opacity on possible pneumonia. She has been getting treatment with IV vancomycin and cefepime. Day 3 antibiotics. Will sopt vancomycin and observe on cefepime.    Acute hypoxic respiratory failure: New oxygen requirement Patient oxygen saturation on room air at 85---88%. Likely related to pneumonia. Will need evaluation for home oxygen prior to discharge. After she was placed on 2 L of oxygen her oxygen sat increased to 95%  Sarcoidosis; on IV stress dose of steroid. Will transition to prednisone today.   CKD Stage III; Creatinine 1.3/1.9. Atrophic right kidney.  No hydronephrosis, multiple renal calculus Metabolic acidosis.  A started on oral bicarb Creatinine decreased to 1.2 Urine culture no growth  DM type 2; Refused blood sugar check  Acute on chronic debility;  PT OT consult.  Anemia, normocytic;  Folic acid level.  Will start supplement. Will also start iron. Repeat hemoglobin tomorrow if continues to be low might need blood transfusion. Hb  lower today at 7.4.  Checking anemia panel.  Repeat hemoglobin in the morning.  Pressure Injury stage II  buttock; Local care  Acute metabolic encephalopathy: Related to infection.  Improving. More confuse this afternoon. Will check CT head.   Pressure Injury 04/13/18 Stage II -  Partial thickness loss of dermis presenting as a shallow open ulcer with a red, pink wound bed without slough. multi small open areas on buttock pink base no drainage stage 2 (Active)  04/13/18 2030  Location: Buttocks  Location Orientation: Right;Left;Mid  Staging: Stage II -  Partial thickness loss of dermis presenting as a shallow open ulcer with a red, pink wound bed without slough.  Wound Description (Comments): multi small open areas on buttock pink base no drainage stage 2  Present on Admission:      Estimated body mass index is 40.53 kg/m as calculated from the following:   Height as of this encounter: 5\' 1"  (1.549 m).   Weight as of this encounter: 97.3 kg.   DVT prophylaxis: SCDs, Lovenox Code Status: Full code Family Communication: Care discussed with patient Disposition Plan: Need PT OT.  Continue with IV antibiotics  Consultants:   None   Procedures:   Antimicrobials:  Vancomycin 3-9----stop 3-11 Cefepime, Flagyl 3-09  Subjective: Alert oriented to place and name.  She reports that she has a history of asthma.  Denies worsening cough. Reports some shortness of breath. Objective: Vitals:   04/14/18 0450 04/14/18 0451 04/14/18 0608 04/14/18 0620  BP:    (!) 114/59  Pulse:      Resp:      Temp:   97.8 F (36.6 C)   TempSrc:   Axillary   SpO2:   96%   Weight: 97.3 kg 97.3 kg  Height:        Intake/Output Summary (Last 24 hours) at 04/14/2018 0805 Last data filed at 04/14/2018 0756 Gross per 24 hour  Intake 3563.3 ml  Output 550 ml  Net 3013.3 ml   Filed Weights   04/11/18 2250 04/14/18 0450 04/14/18 0451  Weight: 113 kg 97.3 kg 97.3 kg    Examination:  General exam: Appears calm and comfortable  Respiratory system: Clear to auscultation. Respiratory effort  normal. Cardiovascular system: S1 & S2 heard, RRR. No JVD, murmurs, rubs, gallops or clicks. No pedal edema. Gastrointestinal system: Abdomen is nondistended, soft and nontender. No organomegaly or masses felt. Normal bowel sounds heard. Central nervous system: Alert and oriented. No focal neurological deficits. Extremities: Symmetric 5 x 5 power. Skin: No rashes, lesions or ulcers Psychiatry: Judgement and insight appear normal. Mood & affect appropriate.     Data Reviewed: I have personally reviewed following labs and imaging studies  CBC: Recent Labs  Lab 04/11/18 2331 04/11/18 2342 04/12/18 0517 04/13/18 0319 04/14/18 0357  WBC 4.8  --  4.4 6.4 7.1  NEUTROABS 2.4  --   --  5.6  --   HGB 10.0* 10.9* 9.1* 8.5* 7.4*  HCT 33.2* 32.0* 31.7* 28.1* 24.6*  MCV 92.5  --  98.8 94.0 92.8  PLT 506*  --  274 498* 287*   Basic Metabolic Panel: Recent Labs  Lab 04/11/18 2331 04/11/18 2342 04/12/18 0517 04/13/18 0319 04/14/18 0357  NA 135 136 137 137 140  K 3.1* 3.2* 3.4* 4.6 4.1  CL 92*  --  102 105 108  CO2 26  --  21* 17* 21*  GLUCOSE 84  --  78 146* 152*  BUN <5*  --  5* 7* 6*  CREATININE 1.33*  --  1.33* 1.57* 1.27*  CALCIUM 8.6*  --  7.5* 7.7* 7.9*   GFR: Estimated Creatinine Clearance: 45.9 mL/min (A) (by C-G formula based on SCr of 1.27 mg/dL (H)). Liver Function Tests: Recent Labs  Lab 04/11/18 2331  AST 20  ALT 7  ALKPHOS 72  BILITOT 2.4*  PROT 7.2  ALBUMIN 3.1*   Recent Labs  Lab 04/11/18 2331  LIPASE 25   No results for input(s): AMMONIA in the last 168 hours. Coagulation Profile: Recent Labs  Lab 04/11/18 2331  INR 1.1   Cardiac Enzymes: No results for input(s): CKTOTAL, CKMB, CKMBINDEX, TROPONINI in the last 168 hours. BNP (last 3 results) No results for input(s): PROBNP in the last 8760 hours. HbA1C: No results for input(s): HGBA1C in the last 72 hours. CBG: Recent Labs  Lab 04/12/18 1732 04/12/18 2122 04/13/18 0606 04/13/18 1054  04/13/18 2353  GLUCAP 141* 133* 149* 151* 146*   Lipid Profile: No results for input(s): CHOL, HDL, LDLCALC, TRIG, CHOLHDL, LDLDIRECT in the last 72 hours. Thyroid Function Tests: No results for input(s): TSH, T4TOTAL, FREET4, T3FREE, THYROIDAB in the last 72 hours. Anemia Panel: No results for input(s): VITAMINB12, FOLATE, FERRITIN, TIBC, IRON, RETICCTPCT in the last 72 hours. Sepsis Labs: Recent Labs  Lab 04/11/18 2330 04/11/18 2331 04/12/18 0517  PROCALCITON  --  <0.10  --   LATICACIDVEN 1.3  --  1.1    Recent Results (from the past 240 hour(s))  Blood Culture (routine x 2)     Status: None (Preliminary result)   Collection Time: 04/11/18 11:15 PM  Result Value Ref Range Status   Specimen Description BLOOD LEFT ARM  Final   Special Requests   Final    BOTTLES  DRAWN AEROBIC AND ANAEROBIC Blood Culture adequate volume   Culture   Final    NO GROWTH 2 DAYS Performed at [facility], [place] [address], [place], [place] [address]    Report Status PENDING  Incomplete  Blood Culture (routine x 2)     Status: None (Preliminary result)   Collection Time: 04/11/18 11:31 PM  Result Value Ref Range Status   Specimen Description BLOOD LEFT HAND  Final   Special Requests   Final    BOTTLES DRAWN AEROBIC ONLY Blood Culture results may not be optimal due to an inadequate volume of blood received in culture bottles   Culture   Final    NO GROWTH 2 DAYS Performed at [facility], [place] [address] [place], [place] [address]    Report Status PENDING  Incomplete  Urine culture     Status: None   Collection Time: 04/12/18 12:56 AM  Result Value Ref Range Status   Specimen Description URINE, CATHETERIZED  Final   Special Requests NONE  Final   Culture   Final    NO GROWTH Performed at [facility] [address], [place], [place] [address]    Report Status 04/13/2018 FINAL  Final  Group A Strep by PCR     Status: None   Collection Time: 04/12/18  4:06 AM  Result  Value Ref Range Status   Group A Strep by PCR NOT DETECTED NOT DETECTED Final    Comment: Performed at [facility], [place] [address] [place], [place] [address]  MRSA PCR Screening     Status: Abnormal   Collection Time: 04/13/18  4:05 PM  Result Value Ref Range Status   MRSA by PCR POSITIVE (A) NEGATIVE Final    Comment:        The GeneXpert MRSA Assay (FDA approved for NASAL specimens only), is one component of a comprehensive MRSA colonization surveillance program. It is not intended to diagnose MRSA infection nor to guide or monitor treatment for MRSA infections. RESULT CALLED TO, READ BACK BY AND VERIFIED WITH: B PERSON RN [id number] 04/13/18 A BROWNING Performed at [facility], [place] [address], [place],  [address]          Radiology Studies: No results found.      Scheduled Meds: . atorvastatin  40 mg Oral Daily  . enoxaparin (LOVENOX) injection  40 mg Subcutaneous Q24H  . hydrocortisone sod succinate (SOLU-CORTEF) inj  25 mg Intravenous Q8H  . mupirocin ointment   Nasal BID   Continuous Infusions: . sodium chloride 1,000 mL (04/14/18 0157)  . ceFEPime (MAXIPIME) IV    . metronidazole 500 mg (04/14/18 0440)  . vancomycin 1,750 mg (04/14/18 0200)     LOS: 2 days    Time spent: 35 minutes.     Elmarie Shiley, MD [facility] Pager [phone] If 7PM-7AM, please contact night-coverage [email or web address] Password [facility] 04/14/2018, 8:05 AM

## 2018-04-14 NOTE — Progress Notes (Addendum)
Pharmacy Antibiotic Note  Darlene Stafford is a 68 y.o. female admitted on 04/11/2018 with sepsis.  Pharmacy has been consulted for Vancomycin/Cefepime dosing. Scr is improving, WBC WNL, afebrile, BP stable, blood cultures: no growth to date.  Plan: Vancomycin 1750 mg IV q48h >>Estimated AUC: 514 Cefepime 1g IV q12h Flagyl per MD  Consider optimizing therapy and discontinuing Flagyl, vancomycin, or both. F/U infectious work-up Drug levels as indicated   Height: 5\' 1"  (154.9 cm) Weight: 214 lb 8.1 oz (97.3 kg) IBW/kg (Calculated) : 47.8  Temp (24hrs), Avg:98.2 F (36.8 C), Min:97.8 F (36.6 C), Max:98.7 F (37.1 C)  Recent Labs  Lab 04/11/18 2330 04/11/18 2331 04/12/18 0517 04/13/18 0319 04/14/18 0357  WBC  --  4.8 4.4 6.4 7.1  CREATININE  --  1.33* 1.33* 1.57* 1.27*  LATICACIDVEN 1.3  --  1.1  --   --     Estimated Creatinine Clearance: 45.9 mL/min (A) (by C-G formula based on SCr of 1.27 mg/dL (H)).    Allergies  Allergen Reactions  . Fish Allergy Anaphylaxis  . Shellfish Allergy Anaphylaxis  . Sulfa Antibiotics Hives and Itching   Thanks, Lavena Bullion, PharmD Candidate  I discussed / reviewed the pharmacy note by Mr. Theda Sers and I agree with the resident's findings and plans as documented.  Thank you  Anette Guarneri, PharmD [phone] 04/14/2018 10:27 AM

## 2018-04-14 NOTE — Progress Notes (Signed)
Patient more alert and cooperative.Talking and more oriented.

## 2018-04-14 NOTE — Progress Notes (Addendum)
Patient sleepy hard to get her to stay awake and talk with me and she was not cooperative.and confused Skin warm and dry Using abdo. Muscles V.S. stable checked sugar 146. R.N. into assess.Cont. to monitor patient

## 2018-04-14 NOTE — TOC Initial Note (Signed)
Transition of Care (TOC) - Initial/Assessment Note  Marvetta Gibbons RN, BSN Transitions of Care Unit 4E- RN Case Manager [phone]   Patient Details  Name: Darlene Stafford MRN: [id number] Date of Birth: [birth year]  Transition of Care Operating Room Services) CM/SW Contact:    Dawayne Patricia, RN 04/14/2018, 11:49 AM  Clinical Narrative:  Pt admitted with sepsis, PTA pt lived at home with granddaughter, per conversation at bedside with pt and niece, niece stays with pt during the day, pt has all needed DME at home, will have transportation home, uses [employer]. Discussed recommendations for STSNF per PT eval, pt states she is not interested in going to [facility] for any rehab- and declines SNF stating she had [place] services with [facility] PTA and wants to continue with [facility] services (although she wants to consider another agency)- list provided to pt Per CMS guidelines from medicare.gov website with star ratings (copy placed in shadow chart) for [facility] choice- CM will f/u with pt prior to discharge- MD will need to placed [facility] orders for RN/PT/OT/aide-                  Expected Discharge Plan: [place]-- pt refusing and wants to return home with [facility]     Patient Goals and CMS Choice Patient states their goals for this hospitalization and ongoing recovery are:: "I want to go back home, no rehab" CMS Medicare.gov Compare Post Acute Care list provided to:: Patient Choice offered to / list presented to : Patient  Expected Discharge Plan and Services Expected Discharge Plan: [place] Discharge Planning Services: CM Consult Post Acute Care Choice: [place] arrangements for the past 2 months: Single Family Home                          Prior Living Arrangements/Services Living arrangements for the past 2 months: Single Family Home Lives with:: Relatives Patient language and need for interpreter reviewed:: No Do you feel safe going back to the place where you live?: Yes       Need for Family Participation in Patient Care: No (Comment) Care giver support system in place?: Yes (comment) Current home services: Home RN, Home OT, Home PT, Homehealth aide Criminal Activity/Legal Involvement Pertinent to Current Situation/Hospitalization: No - Comment as needed  Activities of Daily Living      Permission Sought/Granted Permission sought to share information with : Facility Sport and exercise psychologist, Family Supports Permission granted to share information with : Yes, Verbal Permission Granted  Share Information with NAME: Burton Apley  Permission granted to share info w AGENCY: [facility] agency  Permission granted to share info w Relationship: niece, granddaughter, son     Emotional Assessment Appearance:: Appears stated age Attitude/Demeanor/Rapport: Engaged Affect (typically observed): Accepting, Pleasant, Appropriate Orientation: : Oriented to Self, Oriented to Place, Oriented to  Time, Oriented to Situation Alcohol / Substance Use: Never Used Psych Involvement: No (comment)  Admission diagnosis:  Sepsis [facility]) [A41.9] Patient Active Problem List   Diagnosis Date Noted  . Pressure injury of skin 04/14/2018  . Acute bronchitis 04/30/2017  . Hemoptysis 04/30/2017  . Sarcoidosis 02/03/2017  . CKD (chronic kidney disease), stage III ([place]) 02/03/2017  . Morbid obesity due to excess calories ([place])   . CAP (community acquired pneumonia) 04/10/2015  . Diabetes mellitus without complication ([place]) 02/63/7858  . Asthma 04/10/2015  . Acute encephalopathy 04/10/2015  . UTI (lower urinary tract infection) 04/10/2015  . AKI (acute kidney injury) ([place])  04/10/2015  . Sepsis ([place]) 04/10/2015  . Hypotension 04/10/2015   PCP:  Katherina Mires, MD Pharmacy:   [place], [place] - [address] [place] [place] [address] Phone: [phone] Fax: [phone]     Social Determinants of Health (SDOH) Interventions     Readmission Risk Interventions 30 Day Unplanned Readmission Risk Score    18% ED to Hosp-Admission (Current) from 04/11/2018 in [facility] 4E CV SURGICAL PROGRESSIVE CARE  30 Day Unplanned Readmission Risk Score (%)  18 Filed at 04/14/2018 0801     This score is the patient's risk of an unplanned readmission within 30 days of being discharged (0 -100%). The score is based on dignosis, age, lab data, medications, orders, and past utilization.   Low:  0-14.9   Medium: 15-21.9   High: 22-29.9   Extreme: 30 and above       No flowsheet data found.

## 2018-04-14 NOTE — Progress Notes (Signed)
Patient AAOX4 this am, now completely confused. Wanting to know where her room is. Son Georgina Snell arrived. Asking him where is her house. Unable to convince patient that she has been in the hospital more than a day. Recognizes son but does not recognize this nurse at this time.  Son wanting to speak with physician and CM.

## 2018-04-15 ENCOUNTER — Inpatient Hospital Stay (HOSPITAL_COMMUNITY): Payer: Medicare Other

## 2018-04-15 LAB — CBC
HCT: 22.9 % — ABNORMAL LOW (ref 36.0–46.0)
Hemoglobin: 7.4 g/dL — ABNORMAL LOW (ref 12.0–15.0)
MCH: 28.7 pg (ref 26.0–34.0)
MCHC: 32.3 g/dL (ref 30.0–36.0)
MCV: 88.8 fL (ref 80.0–100.0)
NRBC: 0 % (ref 0.0–0.2)
Platelets: 414 10*3/uL — ABNORMAL HIGH (ref 150–400)
RBC: 2.58 MIL/uL — ABNORMAL LOW (ref 3.87–5.11)
RDW: 15.4 % (ref 11.5–15.5)
WBC: 6.6 10*3/uL (ref 4.0–10.5)

## 2018-04-15 LAB — BASIC METABOLIC PANEL
ANION GAP: 9 (ref 5–15)
BUN: 5 mg/dL — ABNORMAL LOW (ref 8–23)
CO2: 22 mmol/L (ref 22–32)
Calcium: 7.6 mg/dL — ABNORMAL LOW (ref 8.9–10.3)
Chloride: 107 mmol/L (ref 98–111)
Creatinine, Ser: 1.11 mg/dL — ABNORMAL HIGH (ref 0.44–1.00)
GFR calc Af Amer: 60 mL/min — ABNORMAL LOW (ref >60)
GFR calc non Af Amer: 51 mL/min — ABNORMAL LOW (ref >60)
Glucose, Bld: 166 mg/dL — ABNORMAL HIGH (ref 70–99)
Potassium: 3.6 mmol/L (ref 3.5–5.1)
Sodium: 138 mmol/L (ref 135–145)

## 2018-04-15 MED ORDER — METHYLPREDNISOLONE SODIUM SUCC 40 MG IJ SOLR
40.0000 mg | Freq: Three times a day (TID) | INTRAMUSCULAR | Status: DC
  Administered 2018-04-15 – 2018-04-17 (×7): 40 mg via INTRAVENOUS
  Filled 2018-04-15 (×7): qty 1

## 2018-04-15 MED ORDER — SODIUM CHLORIDE 0.9 % IV SOLN
1.0000 g | INTRAVENOUS | Status: AC
  Administered 2018-04-15 – 2018-04-18 (×4): 1 g via INTRAVENOUS
  Filled 2018-04-15 (×4): qty 10

## 2018-04-15 MED ORDER — ALBUTEROL SULFATE (2.5 MG/3ML) 0.083% IN NEBU
2.5000 mg | INHALATION_SOLUTION | Freq: Three times a day (TID) | RESPIRATORY_TRACT | Status: DC
  Administered 2018-04-15 – 2018-04-16 (×2): 2.5 mg via RESPIRATORY_TRACT
  Filled 2018-04-15 (×3): qty 3

## 2018-04-15 NOTE — Progress Notes (Signed)
  Speech Language Pathology Treatment: Dysphagia  Patient Details Name: Darlene Stafford MRN: [id number] DOB: 08-08-1950 Today's Date: 04/15/2018 Time: 1505-6979 SLP Time Calculation (min) (ACUTE ONLY): 17 min  Assessment / Plan / Recommendation Clinical Impression  Pt was seen for skilled ST targeting goals for dysphagia.  Pt much clearer today in comparison to previous session which lead to increased motivation to trial advanced solids.  Pt consumed dys 2 and dys 3 trials with evidence of oral holding which was mitigated with min cues for use of a liquid wash.  Pt's daughter and niece were at bedside and reported that pt had oral holding at baseline.  No overt s/s of aspiration were evident with purees, solids, or thin liquids.  Recommend slowly advancing pt to dys 2 textures with ongoing need for full supervision during meals due to cognition.  Pt's prognosis for advancement is good as long as her mentation continues to clear.  Pt left with family at bedside.  Continue per current plan of care.    HPI HPI: Pt is a 68 yo female who presents with fever, sore throat, and emesis. CT chest showed possible PNA ni the RUL. She also had recent admission (2/14-2/17) for UTI. PMH: CVA, sarcoidosis, DM2, asthma      SLP Plan  Continue with current plan of care       Recommendations  Diet recommendations: Dysphagia 2 (fine chop);Thin liquid Liquids provided via: Straw;Cup Medication Administration: Whole meds with puree Supervision: Patient able to self feed;Full supervision/cueing for compensatory strategies Compensations: Slow rate;Small sips/bites;Follow solids with liquid Postural Changes and/or Swallow Maneuvers: Seated upright 90 degrees;Upright 30-60 min after meal                Oral Care Recommendations: Oral care BID Follow up Recommendations: Skilled Nursing facility SLP Visit Diagnosis: Dysphagia, unspecified (R13.10) Plan: Continue with current plan of care       GO                 PageSelinda Orion 04/15/2018, 12:32 PM

## 2018-04-15 NOTE — Progress Notes (Signed)
Patient off unit to MRI at this time.   

## 2018-04-15 NOTE — NC FL2 (Signed)
[place] MEDICAID FL2 LEVEL OF CARE SCREENING TOOL     IDENTIFICATION  Patient Name: Darlene Stafford Birthdate: 13-Aug-1950 Sex: female Admission Date (Current Location): 04/11/2018  [facility] [place] Number:  Herbalist and Address:  [facility] [place] [place] [address], [place], [place] [address]      Provider Number: [id number]  Attending Physician Name and Address:  Elmarie Shiley, MD  Relative Name and Phone Number:  Langston Masker [phone]    Current Level of Care: Hospital Recommended Level of Care: [place] Prior Approval Number:    Date Approved/Denied: 03/31/12 PASRR Number: [id number]  Discharge Plan: SNF    Current Diagnoses: Patient Active Problem List   Diagnosis Date Noted  . Pressure injury of skin 04/14/2018  . Acute bronchitis 04/30/2017  . Hemoptysis 04/30/2017  . Sarcoidosis 02/03/2017  . CKD (chronic kidney disease), stage III ([name]) 02/03/2017  . Morbid obesity due to excess calories ([place])   . CAP (community acquired pneumonia) 04/10/2015  . Diabetes mellitus without complication ([place]) 81/82/9937  . Asthma 04/10/2015  . Acute encephalopathy 04/10/2015  . UTI (lower urinary tract infection) 04/10/2015  . AKI (acute kidney injury) ([place]) 04/10/2015  . Sepsis ([place]) 04/10/2015  . Hypotension 04/10/2015    Orientation RESPIRATION BLADDER Height & Weight     Self, Time, Place  Normal Continent Weight: 97.5 kg Height:  5\' 1"  (154.9 cm)  BEHAVIORAL SYMPTOMS/MOOD NEUROLOGICAL BOWEL NUTRITION STATUS      Continent Diet(Dys I- DM diet)  AMBULATORY STATUS COMMUNICATION OF NEEDS Skin   Extensive Assist Verbally Other (Comment)(Pressure Injury stage II buttock;)                       Personal Care Assistance Level of Assistance  Bathing, Feeding, Dressing Bathing Assistance: Limited assistance Feeding assistance: Limited assistance Dressing Assistance: Limited assistance     Functional  Limitations Info             SPECIAL CARE FACTORS FREQUENCY  PT (By licensed PT), OT (By licensed OT), Speech therapy     PT Frequency: 3x week OT Frequency: 3x week     Speech Therapy Frequency: 3x week      Contractures Contractures Info: Not present    Additional Factors Info  Isolation Precautions         Isolation Precautions Info: MRSA     Current Medications (04/15/2018):  This is the current hospital active medication list Current Facility-Administered Medications  Medication Dose Route Frequency Provider Last Rate Last Dose  . acetaminophen (TYLENOL) tablet 650 mg  650 mg Oral Q6H PRN Etta Quill, DO       Or  . acetaminophen (TYLENOL) suppository 650 mg  650 mg Rectal Q6H PRN Etta Quill, DO      . albuterol (PROVENTIL) (2.5 MG/3ML) 0.083% nebulizer solution 2.5 mg  2.5 mg Nebulization Q6H PRN Etta Quill, DO      . albuterol (PROVENTIL) (2.5 MG/3ML) 0.083% nebulizer solution 2.5 mg  2.5 mg Nebulization BID Regalado, Belkys A, MD   2.5 mg at 04/15/18 0858  . atorvastatin (LIPITOR) tablet 40 mg  40 mg Oral Daily Etta Quill, DO   40 mg at 04/15/18 0906  . carvedilol (COREG) tablet 12.5 mg  12.5 mg Oral BID WC Regalado, Belkys A, MD   12.5 mg at 04/15/18 0907  . ceFEPIme (MAXIPIME) 2 g in sodium chloride 0.9 % 100 mL IVPB  2  g Intravenous Q24H Roney Jaffe, MD 200 mL/hr at 04/15/18 0902 2 g at 04/15/18 0902  . enoxaparin (LOVENOX) injection 40 mg  40 mg Subcutaneous Q24H Jennette Kettle M, DO   40 mg at 04/14/18 1402  . ferrous sulfate tablet 325 mg  325 mg Oral BID WC Regalado, Belkys A, MD   325 mg at 04/15/18 0907  . folic acid (FOLVITE) tablet 1 mg  1 mg Oral Daily Regalado, Belkys A, MD   1 mg at 04/15/18 0907  . methylPREDNISolone sodium succinate (SOLU-MEDROL) 40 mg/mL injection 40 mg  40 mg Intravenous TID Regalado, Belkys A, MD      . metroNIDAZOLE (FLAGYL) tablet 500 mg  500 mg Oral Q8H Regalado, Belkys A, MD   500 mg at 04/15/18 0554   . montelukast (SINGULAIR) tablet 10 mg  10 mg Oral QHS Regalado, Belkys A, MD   10 mg at 04/14/18 2143  . mupirocin ointment (BACTROBAN) 2 %   Nasal BID Roney Jaffe, MD      . ondansetron [facility]) tablet 4 mg  4 mg Oral Q6H PRN Etta Quill, DO       Or  . ondansetron [facility]) injection 4 mg  4 mg Intravenous Q6H PRN Etta Quill, DO         Discharge Medications: Please see discharge summary for a list of discharge medications.  Relevant Imaging Results:  Relevant Lab Results:   Additional Information SS#- [id number]  Dawayne Patricia, RN

## 2018-04-15 NOTE — Progress Notes (Addendum)
PROGRESS NOTE    Darlene Stafford  OVZ:[id number] DOB: Sep 27, 1950 DOA: 04/11/2018 PCP: Katherina Mires, MD    Brief Narrative 68 year old with past medical history significant for sarcoid, on chronic prednisone, recently weaned by pulmonologist of prednisone on 01/15/2019, asthma, chronic kidney disease stage IV, gout, morbid obesity, prior stroke diabetes recent hospitalization for sepsis secondary to E. coli bacteremia on 2/14 to 2/17.  Who presents to the emergency department with fever soft blood pressure, diagnosed with pneumonia.  Assessment & Plan:   Principal Problem:   Sepsis ([place]) Active Problems:   Diabetes mellitus without complication ([place])   Sarcoidosis   CKD (chronic kidney disease), stage III ([place])   Pressure injury of skin    Sepsis/PNA; patient presents with fever, cough.  CT with groundglass opacity on possible pneumonia. She received  IV vancomycin and cefepime for 3 days. Day 4 antibiotics.  Vancomycin stopped 3-11. Report SOB; Chest x ray repeated. Infiltrates stables.   Acute hypoxic respiratory failure: New oxygen requirement, history of asthma.  Patient oxygen saturation on room air at 85---88%. Likely related to pneumonia. Will need evaluation for home oxygen prior to discharge. Worsening SOB. Change prednisone to solumedrol. Continue with nebulizer.   Sarcoidosis; on IV stress dose of steroid. She was on chronic prednisone.    CKD Stage III; Creatinine 1.3/1.9. Atrophic right kidney.  No hydronephrosis, multiple renal calculus Metabolic acidosis. On sodium bicarb.  Creatinine decreased to 1.1 Urine culture no growth  DM type 2; Refused blood sugar check  Acute on chronic debility;  PT OT consult.  Anemia, normocytic;  Folic acid level low started supplement.  Iron trial.  Hb stable at 7.4 repeat labs in am.   Pressure Injury stage II buttock; Local care  Acute metabolic encephalopathy: Related to infection Confusion intermittent, less  cooperative today, confuse.  CT head negative for acute stroke.  Will do MRI brain.  Will change antibiotics from cefepime to ceftriaxone. Granddaughter notice worsening confusion after antibiotics.    Pressure Injury 04/13/18 Stage II -  Partial thickness loss of dermis presenting as a shallow open ulcer with a red, pink wound bed without slough. multi small open areas on buttock pink base no drainage stage 2 (Active)  04/13/18 2030  Location: Buttocks  Location Orientation: Right;Left;Mid  Staging: Stage II -  Partial thickness loss of dermis presenting as a shallow open ulcer with a red, pink wound bed without slough.  Wound Description (Comments): multi small open areas on buttock pink base no drainage stage 2  Present on Admission:      Estimated body mass index is 40.61 kg/m as calculated from the following:   Height as of this encounter: 5\' 1"  (1.549 m).   Weight as of this encounter: 97.5 kg.   DVT prophylaxis: SCDs, Lovenox Code Status: Full code Family Communication: Care discussed with son Disposition Plan: Need PT OT.  Continue with IV antibiotics  Consultants:   None   Procedures:   Antimicrobials:  Vancomycin 3-9----stop 3-11 Cefepime, Flagyl 3-09  Subjective: She is confuse today, less cooperative, asking for something to drink.   Objective: Vitals:   04/15/18 0600 04/15/18 0857 04/15/18 0858 04/15/18 1156  BP:  125/60  (!) 113/56  Pulse: 89 87    Resp: (!) 28   19  Temp:  97.6 F (36.4 C)  98.3 F (36.8 C)  TempSrc:  Oral  Oral  SpO2: 96% 100% 100% 95%  Weight: 97.5 kg     Height:  Intake/Output Summary (Last 24 hours) at 04/15/2018 1227 Last data filed at 04/15/2018 0556 Gross per 24 hour  Intake 480 ml  Output --  Net 480 ml   Filed Weights   04/14/18 0451 04/15/18 0512 04/15/18 0600  Weight: 97.3 kg 98.1 kg 97.5 kg    Examination:  General exam: NAD Respiratory system: Bilateral wheezing.  Cardiovascular system: S 1, S 2  RRR Gastrointestinal system: BS present,. Soft, nt Central nervous system: alert, confuse no cooperative, didn't want to move LE, refuse to move right arm.  Extremities:  No edema   Data Reviewed: I have personally reviewed following labs and imaging studies  CBC: Recent Labs  Lab 04/11/18 2331 04/11/18 2342 04/12/18 0517 04/13/18 0319 04/14/18 0357 04/15/18 0457  WBC 4.8  --  4.4 6.4 7.1 6.6  NEUTROABS 2.4  --   --  5.6  --   --   HGB 10.0* 10.9* 9.1* 8.5* 7.4* 7.4*  HCT 33.2* 32.0* 31.7* 28.1* 24.6* 22.9*  MCV 92.5  --  98.8 94.0 92.8 88.8  PLT 506*  --  274 498* 468* 941*   Basic Metabolic Panel: Recent Labs  Lab 04/11/18 2331 04/11/18 2342 04/12/18 0517 04/13/18 0319 04/14/18 0357 04/15/18 0457  NA 135 136 137 137 140 138  K 3.1* 3.2* 3.4* 4.6 4.1 3.6  CL 92*  --  102 105 108 107  CO2 26  --  21* 17* 21* 22  GLUCOSE 84  --  78 146* 152* 166*  BUN <5*  --  5* 7* 6* 5*  CREATININE 1.33*  --  1.33* 1.57* 1.27* 1.11*  CALCIUM 8.6*  --  7.5* 7.7* 7.9* 7.6*   GFR: Estimated Creatinine Clearance: 52.6 mL/min (A) (by C-G formula based on SCr of 1.11 mg/dL (H)). Liver Function Tests: Recent Labs  Lab 04/11/18 2331  AST 20  ALT 7  ALKPHOS 72  BILITOT 2.4*  PROT 7.2  ALBUMIN 3.1*   Recent Labs  Lab 04/11/18 2331  LIPASE 25   No results for input(s): AMMONIA in the last 168 hours. Coagulation Profile: Recent Labs  Lab 04/11/18 2331  INR 1.1   Cardiac Enzymes: No results for input(s): CKTOTAL, CKMB, CKMBINDEX, TROPONINI in the last 168 hours. BNP (last 3 results) No results for input(s): PROBNP in the last 8760 hours. HbA1C: No results for input(s): HGBA1C in the last 72 hours. CBG: Recent Labs  Lab 04/12/18 1732 04/12/18 2122 04/13/18 0606 04/13/18 1054 04/13/18 2353  GLUCAP 141* 133* 149* 151* 146*   Lipid Profile: No results for input(s): CHOL, HDL, LDLCALC, TRIG, CHOLHDL, LDLDIRECT in the last 72 hours. Thyroid Function Tests: No  results for input(s): TSH, T4TOTAL, FREET4, T3FREE, THYROIDAB in the last 72 hours. Anemia Panel: Recent Labs    04/14/18 0811  VITAMINB12 1,155*  FOLATE 5.9*  FERRITIN 559*  TIBC 192*  IRON 86  RETICCTPCT 4.9*   Sepsis Labs: Recent Labs  Lab 04/11/18 2330 04/11/18 2331 04/12/18 0517  PROCALCITON  --  <0.10  --   LATICACIDVEN 1.3  --  1.1    Recent Results (from the past 240 hour(s))  Blood Culture (routine x 2)     Status: None (Preliminary result)   Collection Time: 04/11/18 11:15 PM  Result Value Ref Range Status   Specimen Description BLOOD LEFT ARM  Final   Special Requests   Final    BOTTLES DRAWN AEROBIC AND ANAEROBIC Blood Culture adequate volume   Culture   Final  NO GROWTH 3 DAYS Performed at [facility], [place] [address], [place], [place] [address]    Report Status PENDING  Incomplete  Blood Culture (routine x 2)     Status: None (Preliminary result)   Collection Time: 04/11/18 11:31 PM  Result Value Ref Range Status   Specimen Description BLOOD LEFT HAND  Final   Special Requests   Final    BOTTLES DRAWN AEROBIC ONLY Blood Culture results may not be optimal due to an inadequate volume of blood received in culture bottles   Culture   Final    NO GROWTH 3 DAYS Performed at [facility], [place] [address], [place], [place] [address]    Report Status PENDING  Incomplete  Urine culture     Status: None   Collection Time: 04/12/18 12:56 AM  Result Value Ref Range Status   Specimen Description URINE, CATHETERIZED  Final   Special Requests NONE  Final   Culture   Final    NO GROWTH Performed at [place] [address] [place], [place] [address]    Report Status 04/13/2018 FINAL  Final  Group A Strep by PCR     Status: None   Collection Time: 04/12/18  4:06 AM  Result Value Ref Range Status   Group A Strep by PCR NOT DETECTED NOT DETECTED Final    Comment: Performed at [facility], [place] [address], [place], [place] [address]   MRSA PCR Screening     Status: Abnormal   Collection Time: 04/13/18  4:05 PM  Result Value Ref Range Status   MRSA by PCR POSITIVE (A) NEGATIVE Final    Comment:        The GeneXpert MRSA Assay (FDA approved for NASAL specimens only), is one component of a comprehensive MRSA colonization surveillance program. It is not intended to diagnose MRSA infection nor to guide or monitor treatment for MRSA infections. RESULT CALLED TO, READ BACK BY AND VERIFIED WITH: B PERSON RN 1829 04/13/18 A BROWNING Performed at [facility], [place] [address] [place],  [address]          Radiology Studies: Dg Chest 2 View  Result Date: 04/15/2018 CLINICAL DATA:  Chest pain with shortness of breath. EXAM: CHEST - 2 VIEW COMPARISON:  04/12/2018 FINDINGS: Reticular opacities are identified throughout the RIGHT lung and associated with low lung volumes. Similar changes are identified at the LEFT placed to a lesser degree and the overall pattern is stable. No new consolidations or evidence for pulmonary edema. IMPRESSION: Stable appearance of chronic lung disease.  No acute abnormality. Electronically Signed   By: Nolon Nations M.D.   On: 04/15/2018 11:16   Ct Head Wo Contrast  Result Date: 04/14/2018 CLINICAL DATA:  Acute onset of confusion. EXAM: CT HEAD WITHOUT CONTRAST TECHNIQUE: Contiguous axial images were obtained from the base of the skull through the vertex without intravenous contrast. COMPARISON:  None. FINDINGS: Brain: No evidence of acute infarction, hemorrhage, hydrocephalus, extra-axial collection or mass lesion / mass effect. Apparent encephalomalacia at the right cerebellar hemisphere is thought to reflect remote infarct, though would correlate for associated symptoms. Prominence of the ventricles and sulci reflects mild cortical volume loss. Mild periventricular and subcortical white matter change likely reflects small vessel ischemic microangiopathy. The brainstem and fourth  ventricle are within normal limits. The basal ganglia are unremarkable in appearance. The cerebral hemispheres demonstrate grossly normal gray-white differentiation. No mass effect or midline shift is  seen. Vascular: No hyperdense vessel or unexpected calcification. Skull: There is no evidence of fracture; visualized osseous structures are unremarkable in appearance. Sinuses/Orbits: The orbits are within normal limits. There is opacification of the mastoid air cells bilaterally. The paranasal sinuses are well-aerated. Other: No significant soft tissue abnormalities are seen. IMPRESSION: 1. No definite acute intracranial pathology seen on CT. 2. Apparent encephalomalacia at the right cerebellar hemisphere is thought to reflect remote infarct, though would correlate for associated symptoms. 3. Mild cortical volume loss and scattered small vessel ischemic microangiopathy. 4. Opacification of the mastoid air cells bilaterally. Electronically Signed   By: Garald Balding M.D.   On: 04/14/2018 21:11        Scheduled Meds:  albuterol  2.5 mg Nebulization BID   atorvastatin  40 mg Oral Daily   carvedilol  12.5 mg Oral BID WC   enoxaparin (LOVENOX) injection  40 mg Subcutaneous Q24H   ferrous sulfate  325 mg Oral BID WC   folic acid  1 mg Oral Daily   methylPREDNISolone (SOLU-MEDROL) injection  40 mg Intravenous TID   metroNIDAZOLE  500 mg Oral Q8H   montelukast  10 mg Oral QHS   mupirocin ointment   Nasal BID   Continuous Infusions:  ceFEPime (MAXIPIME) IV 2 g (04/15/18 0902)     LOS: 3 days    Time spent: 35 minutes.     Elmarie Shiley, MD [facility] Pager [phone] If 7PM-7AM, please contact night-coverage [email or web address] Password [facility] 04/15/2018, 12:27 PM

## 2018-04-15 NOTE — Evaluation (Signed)
Occupational Therapy Evaluation Patient Details Name: Darlene Stafford MRN: [id number] DOB: 1950/06/12 Today's Date: 04/15/2018    History of Present Illness Patient is 68 y/o female admitted to hospital with fever and fatigue. Patient with sepsis secondary to pneumonia. Imaging also revealed R renal scarring. PMH includes DM, sarcoidosis, CVA and anemia.   Clinical Impression   Pt ambulated with a walker, fed, dressed and completed toileting independently prior to admission. She was assisted for bathing and dependent in housekeeping and meal prep per family who was bedside. Pt awakened for evaluation and somewhat groggy throughout session. She requires min assist for bed mobility, mod assist for transfers and up to max assist for ADL. She presents with impaired cognition, but family reports she is better than upon admission. Recommending ST rehab in SNF prior to return home. Will follow acutely.    Follow Up Recommendations  SNF;Supervision/Assistance - 24 hour    Equipment Recommendations  None recommended by OT    Recommendations for Other Services       Precautions / Restrictions Precautions Precautions: Fall Restrictions Weight Bearing Restrictions: No      Mobility Bed Mobility Overal bed mobility: Needs Assistance Bed Mobility: Supine to Sit;Sit to Supine     Supine to sit: Supervision Sit to supine: Min assist   General bed mobility comments: increased time, cues to use rail, assist for LEs back into bed  Transfers Overall transfer level: Needs assistance Equipment used: Rolling walker (2 wheeled) Transfers: Sit to/from [facility] Sit to Stand: Mod assist Stand pivot transfers: Mod assist       General transfer comment: cues for hand placement, assist to rise and steady and to advance walker    Balance Overall balance assessment: Needs assistance Sitting-balance support: Feet supported;Bilateral upper extremity supported Sitting balance-Leahy  Scale: Fair     Standing balance support: Bilateral upper extremity supported Standing balance-Leahy Scale: Poor Standing balance comment: B UE and external support for balance                           ADL either performed or assessed with clinical judgement   ADL Overall ADL's : Needs assistance/impaired Eating/Feeding: Set up;Sitting   Grooming: Wash/dry hands;Wash/dry face;Sitting;Set up   Upper Body Bathing: Moderate assistance;Sitting   Lower Body Bathing: Maximal assistance;Sit to/from stand   Upper Body Dressing : Minimal assistance;Sitting   Lower Body Dressing: Maximal assistance;Sit to/from stand   Toilet Transfer: Moderate assistance;Stand-pivot;RW;BSC   Toileting- Clothing Manipulation and Hygiene: Maximal assistance;Sit to/from stand               Vision Patient Visual Report: No change from baseline       Perception     Praxis      Pertinent Vitals/Pain Pain Assessment: Faces Pain Score: 0-No pain Faces Pain Scale: No hurt     Hand Dominance Right   Extremity/Trunk Assessment Upper Extremity Assessment Upper Extremity Assessment: Generalized weakness   Lower Extremity Assessment Lower Extremity Assessment: Defer to PT evaluation   Cervical / Trunk Assessment Cervical / Trunk Assessment: Kyphotic   Communication Communication Communication: Expressive difficulties   Cognition Arousal/Alertness: Lethargic Behavior During Therapy: Flat affect Overall Cognitive Status: Impaired/Different from baseline Area of Impairment: Following commands;Problem solving;Memory;Orientation                 Orientation Level: Disoriented to;Place;Time;Situation   Memory: Decreased short-term memory Following Commands: Follows one step commands with increased time;Follows one step commands  inconsistently     Problem Solving: Slow processing;Decreased initiation;Difficulty sequencing;Requires verbal cues;Requires tactile cues General  Comments: family reports her cognition is improved compared to admission   General Comments       Exercises     Shoulder Instructions      Home Living Family/patient expects to be discharged to:: Private residence Living Arrangements: Other relatives(niece) Available Help at Discharge: Family;Available 24 hours/day Type of Home: House Home Access: Level entry Entrance Stairs-Number of Steps: 1 threshold Entrance Stairs-Rails: None Home Layout: One level     Bathroom Shower/Tub: Teacher, early years/pre: Standard     Home Equipment: Environmental consultant - 2 wheels;Wheelchair - manual   Additional Comments: per daughter and niece      Prior Functioning/Environment Level of Independence: Needs assistance  Gait / Transfers Assistance Needed: Reports using a RW for household ambulation. States uses a w/c when out in community.  ADL's / Homemaking Assistance Needed: assist for bathing, dresses herself, family does housekeeping and meal prep            OT Problem List: Decreased strength;Decreased activity tolerance;Impaired balance (sitting and/or standing);Decreased cognition;Decreased knowledge of use of DME or AE;Decreased safety awareness;Obesity      OT Treatment/Interventions: Self-care/ADL training;DME and/or AE instruction;Therapeutic activities;Cognitive remediation/compensation;Patient/family education;Balance training    OT Goals(Current goals can be found in the care plan section) Acute Rehab OT Goals Patient Stated Goal: get stronger OT Goal Formulation: With patient Time For Goal Achievement: 04/29/18 Potential to Achieve Goals: Good ADL Goals Pt Will Perform Grooming: with min guard assist;standing Pt Will Perform Upper Body Dressing: with supervision;with set-up;sitting Pt Will Perform Lower Body Dressing: with min assist;sit to/from stand Pt Will Transfer to Toilet: with min guard assist;ambulating;bedside commode Pt Will Perform Toileting - Clothing  Manipulation and hygiene: with min assist;sit to/from stand  OT Frequency: Min 2X/week   Barriers to D/C:            Co-evaluation              AM-PAC OT "6 Clicks" Daily Activity     Outcome Measure Help from another person eating meals?: A Little Help from another person taking care of personal grooming?: A Little Help from another person toileting, which includes using toliet, bedpan, or urinal?: A Lot Help from another person bathing (including washing, rinsing, drying)?: A Lot Help from another person to put on and taking off regular upper body clothing?: A Little Help from another person to put on and taking off regular lower body clothing?: A Lot 6 Click Score: 15   End of Session Equipment Utilized During Treatment: Gait belt;Rolling walker;Oxygen  Activity Tolerance: Patient tolerated treatment well Patient left: in bed;with call bell/phone within reach;with bed alarm set  OT Visit Diagnosis: Unsteadiness on feet (R26.81);Other abnormalities of gait and mobility (R26.89);Other symptoms and signs involving cognitive function;Muscle weakness (generalized) (M62.81)                Time: 1351-1410 OT Time Calculation (min): 19 min Charges:  OT General Charges $OT Visit: 1 Visit OT Evaluation $OT Eval Moderate Complexity: 1 Mod  Nestor Lewandowsky, OTR/L Acute Rehabilitation Services Pager: [phone] Office: [phone]  Malka So 04/15/2018, 3:24 PM

## 2018-04-15 NOTE — TOC Progression Note (Signed)
Transition of Care [facility]) - Progression Note    Patient Details  Name: Darlene Stafford MRN: [id number] Date of Birth: 1950/08/07  [facility] CM/SW Contact  Dahlia Client, Romeo Rabon, RN Phone Number: 04/15/2018, 11:20 AM  Clinical Narrative:    On 04/14/18- at 53- asked to speak with son Georgina Snell at the bedside, pt has become confused and son concerned about pt returning home with [facility], wants to look at pt going to [facility], has a preference for [place] as first two choices. Pt is reluctant at this time to agree to go to rehab but agreeable to do a bed search. CM has completed FL2 and sent info to requested facilities for bed availability. Son also provided consent to do further search in [place] if needed if preferred facilities are not available or do not offer a bed. CM will continue to follow.    Expected Discharge Plan: [place]    Expected Discharge Plan and Services Expected Discharge Plan: [place] Discharge Planning Services: CM Consult Post Acute Care Choice: [place] Living arrangements for the past 2 months: [place] Agency: [place] ([facility])   Social Determinants of Health (SDOH) Interventions    Readmission Risk Interventions 30 Day Unplanned Readmission Risk Score    19% ED to Hosp-Admission (Current) from 04/11/2018 in [facility] 4E CV SURGICAL PROGRESSIVE CARE  30 Day Unplanned Readmission Risk Score (%)  19 Filed at 04/15/2018 0800     This score is the patient's risk of an unplanned readmission within 30 days of being discharged (0 -100%). The score is based on dignosis, age, lab data, medications, orders, and past utilization.   Low:  0-14.9   Medium: 15-21.9   High: 22-29.9   Extreme: 30 and above       No flowsheet data found.

## 2018-04-16 LAB — CBC
HCT: 26.9 % — ABNORMAL LOW (ref 36.0–46.0)
Hemoglobin: 8.6 g/dL — ABNORMAL LOW (ref 12.0–15.0)
MCH: 28.5 pg (ref 26.0–34.0)
MCHC: 32 g/dL (ref 30.0–36.0)
MCV: 89.1 fL (ref 80.0–100.0)
PLATELETS: 498 10*3/uL — AB (ref 150–400)
RBC: 3.02 MIL/uL — ABNORMAL LOW (ref 3.87–5.11)
RDW: 14.6 % (ref 11.5–15.5)
WBC: 4.9 10*3/uL (ref 4.0–10.5)
nRBC: 0 % (ref 0.0–0.2)

## 2018-04-16 LAB — AMMONIA: Ammonia: 37 umol/L — ABNORMAL HIGH (ref 9–35)

## 2018-04-16 LAB — BASIC METABOLIC PANEL
Anion gap: 10 (ref 5–15)
BUN: <5 mg/dL — ABNORMAL LOW (ref 8–23)
CO2: 23 mmol/L (ref 22–32)
Calcium: 7.8 mg/dL — ABNORMAL LOW (ref 8.9–10.3)
Chloride: 103 mmol/L (ref 98–111)
Creatinine, Ser: 1.09 mg/dL — ABNORMAL HIGH (ref 0.44–1.00)
GFR calc Af Amer: >60 mL/min (ref >60)
GFR calc non Af Amer: 52 mL/min — ABNORMAL LOW (ref >60)
GLUCOSE: 269 mg/dL — AB (ref 70–99)
Potassium: 3.2 mmol/L — ABNORMAL LOW (ref 3.5–5.1)
Sodium: 136 mmol/L (ref 135–145)

## 2018-04-16 LAB — T4, FREE: Free T4: 0.56 ng/dL — ABNORMAL LOW (ref 0.82–1.77)

## 2018-04-16 LAB — TSH: TSH: 0.218 u[IU]/mL — ABNORMAL LOW (ref 0.350–4.500)

## 2018-04-16 MED ORDER — POTASSIUM CHLORIDE CRYS ER 20 MEQ PO TBCR
40.0000 meq | EXTENDED_RELEASE_TABLET | Freq: Once | ORAL | Status: AC
  Administered 2018-04-16: 40 meq via ORAL
  Filled 2018-04-16: qty 2

## 2018-04-16 MED ORDER — ALBUTEROL SULFATE (2.5 MG/3ML) 0.083% IN NEBU
2.5000 mg | INHALATION_SOLUTION | Freq: Two times a day (BID) | RESPIRATORY_TRACT | Status: DC
  Administered 2018-04-16 – 2018-04-20 (×7): 2.5 mg via RESPIRATORY_TRACT
  Filled 2018-04-16 (×7): qty 3

## 2018-04-16 NOTE — Progress Notes (Signed)
Physical Therapy Treatment Patient Details Name: Darlene Stafford MRN: [id number] DOB: 12-Jun-1950 Today's Date: 04/16/2018    History of Present Illness Patient is 68 y/o female admitted to hospital with fever and fatigue. Patient with sepsis secondary to pneumonia. MRI revealed R cerebellar encephalomalcia. Imaging also revealed R renal scarring. PMH includes DM, sarcoidosis, CVA and anemia.    PT Comments    Patient progressing towards goals. Patient required min-modA to stand with RW x2 for clean up following BM. Patient able to take a few steps to recliner with close min guard and use of RW. Educated and reviewed seated HEP for BLE strengthening. Current recommendation remains appropriate. Patient will benefit from acute physical therapy to maximize independence and safety with functional mobility.    Follow Up Recommendations  SNF;Supervision/Assistance - 24 hour     Equipment Recommendations  Other (comment)(TBD at next venue)    Recommendations for Other Services       Precautions / Restrictions Precautions Precautions: Fall Restrictions Weight Bearing Restrictions: No    Mobility  Bed Mobility Overal bed mobility: Needs Assistance Bed Mobility: Supine to Sit     Supine to sit: Min assist     General bed mobility comments: Patient required minA for bed mobility. Required increased time and effort and use of bed rails to sit EOB.   Transfers Overall transfer level: Needs assistance Equipment used: Rolling walker (2 wheeled) Transfers: Sit to/from Stand Sit to Stand: Mod assist;Min assist         General transfer comment: Patient required modA to stand on first attempt with use of RW for clean up following BM. Verbal and manual cues for safe hand placement when using RW. Required minA to stand second time with RW. Patient able to maintain standing balance with use of RW and minguard-minA for safety. Improved standing tolerance noted from previous session and able to  tolerate clean up while standing.   Ambulation/Gait Ambulation/Gait assistance: Min guard Gait Distance (Feet): 2 Feet Assistive device: Rolling walker (2 wheeled) Gait Pattern/deviations: Step-to pattern;Wide base of support Gait velocity: Decreased Gait velocity interpretation: <1.8 ft/sec, indicate of risk for recurrent falls General Gait Details: Patient required close min guard to take a few steps from bed to recliner with use of RW for safety. Verbal cues for sequencing with RW.   Stairs             Wheelchair Mobility    Modified Rankin (Stroke Patients Only)       Balance Overall balance assessment: Needs assistance Sitting-balance support: Bilateral upper extremity supported;Feet supported Sitting balance-Leahy Scale: Poor     Standing balance support: Bilateral upper extremity supported Standing balance-Leahy Scale: Poor Standing balance comment: reliant on BUE support to maintain standing balance                            Cognition Arousal/Alertness: Awake/alert Behavior During Therapy: Flat affect Overall Cognitive Status: Impaired/Different from baseline                   Orientation Level: Disoriented to;Time;Situation   Memory: Decreased short-term memory Following Commands: Follows one step commands with increased time;Follows one step commands inconsistently     Problem Solving: Slow processing;Decreased initiation;Difficulty sequencing;Requires verbal cues;Requires tactile cues General Comments: Patient responding appropriately to cues during session. Still requires increased  time but much improved from first session earlier this week. Patient A&Ox2      Exercises General Exercises -  Lower Extremity Long Arc Quad: AROM;Both;10 reps;Seated Hip Flexion/Marching: AROM;Both;10 reps;Seated    General Comments General comments (skin integrity, edema, etc.): Patient brother and niece in room during session      Pertinent  Vitals/Pain Pain Assessment: Faces Faces Pain Scale: Hurts a little bit Pain Location: bottom Pain Descriptors / Indicators: Grimacing;Discomfort Pain Intervention(s): Limited activity within patient's tolerance;Monitored during session    Home Living                      Prior Function            PT Goals (current goals can now be found in the care plan section) Acute Rehab PT Goals Patient Stated Goal: "get cleaned up" PT Goal Formulation: With patient Time For Goal Achievement: 04/27/18 Potential to Achieve Goals: Fair Progress towards PT goals: Progressing toward goals    Frequency    Min 2X/week      PT Plan Current plan remains appropriate    Co-evaluation              AM-PAC PT "6 Clicks" Mobility   Outcome Measure  Help needed turning from your back to your side while in a flat bed without using bedrails?: A Little Help needed moving from lying on your back to sitting on the side of a flat bed without using bedrails?: A Little Help needed moving to and from a bed to a chair (including a wheelchair)?: A Little Help needed standing up from a chair using your arms (e.g., wheelchair or bedside chair)?: A Lot Help needed to walk in hospital room?: A Lot Help needed climbing 3-5 steps with a railing? : Total 6 Click Score: 14    End of Session Equipment Utilized During Treatment: Gait belt Activity Tolerance: Patient tolerated treatment well Patient left: in chair;with call bell/phone within reach;with chair alarm set;with family/visitor present Nurse Communication: Mobility status PT Visit Diagnosis: Other abnormalities of gait and mobility (R26.89);Muscle weakness (generalized) (M62.81);Difficulty in walking, not elsewhere classified (R26.2)     Time: 5852-7782 PT Time Calculation (min) (ACUTE ONLY): 24 min  Charges:  $Therapeutic Activity: 23-37 mins                     Erick Blinks, SPT  Erick Blinks 04/16/2018, 3:05 PM

## 2018-04-16 NOTE — Plan of Care (Signed)
  Problem: Clinical Measurements: Goal: Will remain free from infection Outcome: Progressing   Problem: Coping: Goal: Level of anxiety will decrease Outcome: Progressing   Problem: Health Behavior/Discharge Planning: Goal: Ability to manage health-related needs will improve Outcome: Not Progressing

## 2018-04-16 NOTE — Progress Notes (Signed)
Pt is on D5 abx for PNA. Ok to add stop date of 7d per Dr. Tyrell Antonio.  Onnie Boer, PharmD, BCIDP, AAHIVP, CPP Infectious Disease Pharmacist 04/16/2018 11:42 AM

## 2018-04-16 NOTE — Progress Notes (Signed)
PROGRESS NOTE    Darlene Stafford  LGX:[id number] DOB: August 03, 1950 DOA: 04/11/2018 PCP: Katherina Mires, MD    Brief Narrative 68 year old with past medical history significant for sarcoid, on chronic prednisone, recently weaned by pulmonologist of prednisone on 01/15/2019, asthma, chronic kidney disease stage IV, gout, morbid obesity, prior stroke diabetes recent hospitalization for sepsis secondary to E. coli bacteremia on 2/14 to 2/17.  Who presents to the emergency department with fever soft blood pressure, diagnosed with pneumonia.  Assessment & Plan:   Principal Problem:   Sepsis ([place]) Active Problems:   Diabetes mellitus without complication ([place])   Sarcoidosis   CKD (chronic kidney disease), stage III ([place])   Pressure injury of skin    Sepsis/PNA; patient presents with fever, cough.  CT with groundglass opacity on possible pneumonia. She received  IV vancomycin and cefepime for 3 days. Day 4 antibiotics.  Vancomycin stopped 3-11. Report SOB; Chest x ray repeated. Infiltrates stables.  Will treat for 7 days.   Acute hypoxic respiratory failure: New oxygen requirement, history of asthma.  Patient oxygen saturation on room air at 85---88%. Likely related to pneumonia. Will need evaluation for home oxygen prior to discharge. On IV solumedrol, transition to prednisone tomorrow.   Sarcoidosis; on IV stress dose of steroid. She was on chronic prednisone.    CKD Stage III; Creatinine 1.3/1.9. Atrophic right kidney.  No hydronephrosis, multiple renal calculus Metabolic acidosis. On sodium bicarb.  Creatinine decreased to 1.1 Urine culture no growth  DM type 2; Refused blood sugar check  Acute on chronic debility;  PT OT consult.  Anemia, normocytic;  Folic acid level low started supplement.  Iron trial.  Hb increase today at 8.4.   Pressure Injury stage II buttock; Local care  Acute metabolic encephalopathy: Related to infection Confusion intermittent, less  cooperative today, confuse.  CT head negative for acute stroke.  MRI negative for acute stroke, empty sell. Check TSH. Already on chronic prednisone.  Change  antibiotics from cefepime to ceftriaxone. Granddaughter notice worsening confusion after antibiotics.  Patient is more alert and less confuse today    Pressure Injury 04/13/18 Stage II -  Partial thickness loss of dermis presenting as a shallow open ulcer with a red, pink wound bed without slough. multi small open areas on buttock pink base no drainage stage 2 (Active)  04/13/18 2030  Location: Buttocks  Location Orientation: Right;Left;Mid  Staging: Stage II -  Partial thickness loss of dermis presenting as a shallow open ulcer with a red, pink wound bed without slough.  Wound Description (Comments): multi small open areas on buttock pink base no drainage stage 2  Present on Admission:      Estimated body mass index is 40.11 kg/m as calculated from the following:   Height as of this encounter: 5\' 1"  (1.549 m).   Weight as of this encounter: 96.3 kg.   DVT prophylaxis: SCDs, Lovenox Code Status: Full code Family Communication: Care discussed with granddaughter  Disposition Plan: Need PT OT.  Continue with IV antibiotics  Consultants:   None   Procedures:   Antimicrobials:  Vancomycin 3-9----stop 3-11 Cefepime, Flagyl 3-09  Subjective: She is alert this morning less confused.  She reports that she is breathing better.   Objective: Vitals:   04/16/18 1100 04/16/18 1200 04/16/18 1202 04/16/18 1204  BP:    (!) 148/79  Pulse: 79   80  Resp: 18 (!) 22 (!) 21 19  Temp:    97.7 F (36.5 C)  TempSrc:  Oral  SpO2: 100%   100%  Weight:      Height:        Intake/Output Summary (Last 24 hours) at 04/16/2018 1248 Last data filed at 04/16/2018 0900 Gross per 24 hour  Intake 220 ml  Output 4 ml  Net 216 ml   Filed Weights   04/15/18 0512 04/15/18 0600 04/16/18 0341  Weight: 98.1 kg 97.5 kg 96.3 kg     Examination:  General exam: Acute distress Respiratory system: Less wheezing Cardiovascular system: S1, S2 regular rhythm and rate Gastrointestinal system: Sounds present soft nontender nondistended Central nervous system: Alert following commands Extremities:  No edema   Data Reviewed: I have personally reviewed following labs and imaging studies  CBC: Recent Labs  Lab 04/11/18 2331  04/12/18 0517 04/13/18 0319 04/14/18 0357 04/15/18 0457 04/16/18 0321  WBC 4.8  --  4.4 6.4 7.1 6.6 4.9  NEUTROABS 2.4  --   --  5.6  --   --   --   HGB 10.0*   < > 9.1* 8.5* 7.4* 7.4* 8.6*  HCT 33.2*   < > 31.7* 28.1* 24.6* 22.9* 26.9*  MCV 92.5  --  98.8 94.0 92.8 88.8 89.1  PLT 506*  --  274 498* 468* 414* 498*   < > = values in this interval not displayed.   Basic Metabolic Panel: Recent Labs  Lab 04/12/18 0517 04/13/18 0319 04/14/18 0357 04/15/18 0457 04/16/18 0321  NA 137 137 140 138 136  K 3.4* 4.6 4.1 3.6 3.2*  CL 102 105 108 107 103  CO2 21* 17* 21* 22 23  GLUCOSE 78 146* 152* 166* 269*  BUN 5* 7* 6* 5* <5*  CREATININE 1.33* 1.57* 1.27* 1.11* 1.09*  CALCIUM 7.5* 7.7* 7.9* 7.6* 7.8*   GFR: Estimated Creatinine Clearance: 53.1 mL/min (A) (by C-G formula based on SCr of 1.09 mg/dL (H)). Liver Function Tests: Recent Labs  Lab 04/11/18 2331  AST 20  ALT 7  ALKPHOS 72  BILITOT 2.4*  PROT 7.2  ALBUMIN 3.1*   Recent Labs  Lab 04/11/18 2331  LIPASE 25   Recent Labs  Lab 04/16/18 0321  AMMONIA 37*   Coagulation Profile: Recent Labs  Lab 04/11/18 2331  INR 1.1   Cardiac Enzymes: No results for input(s): CKTOTAL, CKMB, CKMBINDEX, TROPONINI in the last 168 hours. BNP (last 3 results) No results for input(s): PROBNP in the last 8760 hours. HbA1C: No results for input(s): HGBA1C in the last 72 hours. CBG: Recent Labs  Lab 04/12/18 1732 04/12/18 2122 04/13/18 0606 04/13/18 1054 04/13/18 2353  GLUCAP 141* 133* 149* 151* 146*   Lipid Profile: No  results for input(s): CHOL, HDL, LDLCALC, TRIG, CHOLHDL, LDLDIRECT in the last 72 hours. Thyroid Function Tests: No results for input(s): TSH, T4TOTAL, FREET4, T3FREE, THYROIDAB in the last 72 hours. Anemia Panel: Recent Labs    04/14/18 0811  VITAMINB12 1,155*  FOLATE 5.9*  FERRITIN 559*  TIBC 192*  IRON 86  RETICCTPCT 4.9*   Sepsis Labs: Recent Labs  Lab 04/11/18 2330 04/11/18 2331 04/12/18 0517  PROCALCITON  --  <0.10  --   LATICACIDVEN 1.3  --  1.1    Recent Results (from the past 240 hour(s))  Blood Culture (routine x 2)     Status: None (Preliminary result)   Collection Time: 04/11/18 11:15 PM  Result Value Ref Range Status   Specimen Description BLOOD LEFT ARM  Final   Special Requests   Final  BOTTLES DRAWN AEROBIC AND ANAEROBIC Blood Culture adequate volume   Culture   Final    NO GROWTH 3 DAYS Performed at [facility], [place] [address], [place], [place] [address]    Report Status PENDING  Incomplete  Blood Culture (routine x 2)     Status: None (Preliminary result)   Collection Time: 04/11/18 11:31 PM  Result Value Ref Range Status   Specimen Description BLOOD LEFT HAND  Final   Special Requests   Final    BOTTLES DRAWN AEROBIC ONLY Blood Culture results may not be optimal due to an inadequate volume of blood received in culture bottles   Culture   Final    NO GROWTH 3 DAYS Performed at [facility], [place] [address] [place], [place] [address]    Report Status PENDING  Incomplete  Urine culture     Status: None   Collection Time: 04/12/18 12:56 AM  Result Value Ref Range Status   Specimen Description URINE, CATHETERIZED  Final   Special Requests NONE  Final   Culture   Final    NO GROWTH Performed at [place] [address] [place], [place] [address]    Report Status 04/13/2018 FINAL  Final  Group A Strep by PCR     Status: None   Collection Time: 04/12/18  4:06 AM  Result Value Ref Range Status   Group A Strep by PCR NOT  DETECTED NOT DETECTED Final    Comment: Performed at [facility], [place] [address], [place], [place] [address]  MRSA PCR Screening     Status: Abnormal   Collection Time: 04/13/18  4:05 PM  Result Value Ref Range Status   MRSA by PCR POSITIVE (A) NEGATIVE Final    Comment:        The GeneXpert MRSA Assay (FDA approved for NASAL specimens only), is one component of a comprehensive MRSA colonization surveillance program. It is not intended to diagnose MRSA infection nor to guide or monitor treatment for MRSA infections. RESULT CALLED TO, READ BACK BY AND VERIFIED WITH: B PERSON RN 1829 04/13/18 A BROWNING Performed at [facility], [place] [address] [place], [place] [address]          Radiology Studies: Dg Chest 2 View  Result Date: 04/15/2018 CLINICAL DATA:  Chest pain with shortness of breath. EXAM: CHEST - 2 VIEW COMPARISON:  04/12/2018 FINDINGS: Reticular opacities are identified throughout the RIGHT lung and associated with low lung volumes. Similar changes are identified at the LEFT placed to a lesser degree and the overall pattern is stable. No new consolidations or evidence for pulmonary edema. IMPRESSION: Stable appearance of chronic lung disease.  No acute abnormality. Electronically Signed   By: Nolon Nations M.D.   On: 04/15/2018 11:16   Ct Head Wo Contrast  Result Date: 04/14/2018 CLINICAL DATA:  Acute onset of confusion. EXAM: CT HEAD WITHOUT CONTRAST TECHNIQUE: Contiguous axial images were obtained from the base of the skull through the vertex without intravenous contrast. COMPARISON:  None. FINDINGS: Brain: No evidence of acute infarction, hemorrhage, hydrocephalus, extra-axial collection or mass lesion / mass effect. Apparent encephalomalacia at the right cerebellar hemisphere is thought to reflect remote infarct, though would correlate for associated symptoms. Prominence of the ventricles and sulci reflects mild cortical volume loss. Mild periventricular  and subcortical white matter change likely reflects small vessel ischemic microangiopathy. The brainstem and fourth ventricle are within normal limits. The basal ganglia are  unremarkable in appearance. The cerebral hemispheres demonstrate grossly normal gray-white differentiation. No mass effect or midline shift is seen. Vascular: No hyperdense vessel or unexpected calcification. Skull: There is no evidence of fracture; visualized osseous structures are unremarkable in appearance. Sinuses/Orbits: The orbits are within normal limits. There is opacification of the mastoid air cells bilaterally. The paranasal sinuses are well-aerated. Other: No significant soft tissue abnormalities are seen. IMPRESSION: 1. No definite acute intracranial pathology seen on CT. 2. Apparent encephalomalacia at the right cerebellar hemisphere is thought to reflect remote infarct, though would correlate for associated symptoms. 3. Mild cortical volume loss and scattered small vessel ischemic microangiopathy. 4. Opacification of the mastoid air cells bilaterally. Electronically Signed   By: Garald Balding M.D.   On: 04/14/2018 21:11   Mr Brain Wo Contrast  Result Date: 04/15/2018 CLINICAL DATA:  Altered level of consciousness. History of chronic kidney disease, diabetes, stroke and sarcoidosis. EXAM: MRI HEAD WITHOUT CONTRAST TECHNIQUE: Multiplanar, multiecho pulse sequences of the brain and surrounding structures were obtained without intravenous contrast. Axial T2 FLAIR sequence not obtained due to patient motion. COMPARISON:  CT HEAD April 14, 2018 FINDINGS: Moderately motion degraded examination. INTRACRANIAL CONTENTS: No reduced diffusion to suggest acute ischemia. Moderately motion degraded SWI sequence limiting sensitivity for microhemorrhage, no lobar hematoma. RIGHT cerebellar encephalomalacia. Mild supratentorial parenchymal brain volume loss. No hydrocephalus. No hydrocephalus. No suspicious parenchymal signal, masses, mass  effect. No abnormal extra-axial fluid collections. No extra-axial masses. VASCULAR: Normal major intracranial vascular flow voids present at skull base. SKULL AND UPPER CERVICAL SPINE: Expanded empty sella. No suspicious calvarial bone marrow signal. Craniocervical junction maintained. SINUSES/ORBITS: Bilateral mastoid effusions.The included ocular globes and orbital contents are non-suspicious. OTHER: None. IMPRESSION: 1. Moderately motion degraded examination. No acute intracranial process. 2. RIGHT cerebellar encephalomalacia. Mild cerebral parenchymal brain volume loss. 3. Empty sella. Electronically Signed   By: Elon Alas M.D.   On: 04/15/2018 19:20        Scheduled Meds:  albuterol  2.5 mg Nebulization TID   atorvastatin  40 mg Oral Daily   carvedilol  12.5 mg Oral BID WC   enoxaparin (LOVENOX) injection  40 mg Subcutaneous Q24H   ferrous sulfate  325 mg Oral BID WC   folic acid  1 mg Oral Daily   methylPREDNISolone (SOLU-MEDROL) injection  40 mg Intravenous TID   metroNIDAZOLE  500 mg Oral Q8H   montelukast  10 mg Oral QHS   mupirocin ointment   Nasal BID   Continuous Infusions:  cefTRIAXone (ROCEPHIN)  IV 1 g (04/15/18 2217)     LOS: 4 days    Time spent: 35 minutes.     Elmarie Shiley, MD [facility] Pager [phone] If 7PM-7AM, please contact night-coverage [email or web address] Password [facility] 04/16/2018, 12:48 PM

## 2018-04-16 NOTE — Progress Notes (Signed)
  Speech Language Pathology Treatment: Dysphagia  Patient Details Name: Darlene Stafford MRN: [id number] DOB: 08-30-1950 Today's Date: 04/16/2018 Time: 9485-4627 SLP Time Calculation (min) (ACUTE ONLY): 19 min  Assessment / Plan / Recommendation Clinical Impression  Pt was seen for skilled dysphagia treatment this morning. Pt's niece and brother were at bedside and supportive throughout session; assisted in providing verbal cues to pt for safe swallow as appropriate. Pt accepted bites of softened cereal (dysphagia 3 solid) with min verbal cues from SLP and family members to clear pocketed food. Efficient oral clearance achieved with combination of lingual sweep and liquid wash. Suspect that patient will continue to be appropriate for further solid advancement as mentation and stamina continues to improve with consistency. Recommend continue dysphagia 2 diet, thin liquids, meds whole in puree, and full supervision to cues for swallow strategies. ST will continue to follow to provide treatment with diet safety and efficiency while in acute setting.    HPI HPI: Pt is a 68 yo female who presents with fever, sore throat, and emesis. CT chest showed possible PNA ni the RUL. She also had recent admission (2/14-2/17) for UTI. PMH: CVA, sarcoidosis, DM2, asthma      SLP Plan  Continue with current plan of care       Recommendations  Diet recommendations: Dysphagia 2 (fine chop);Thin liquid Liquids provided via: Straw;Cup Medication Administration: Whole meds with puree Supervision: Patient able to self feed;Full supervision/cueing for compensatory strategies Compensations: Slow rate;Small sips/bites;Follow solids with liquid Postural Changes and/or Swallow Maneuvers: Seated upright 90 degrees;Upright 30-60 min after meal                Oral Care Recommendations: Oral care BID Follow up Recommendations: Skilled Nursing facility SLP Visit Diagnosis: Dysphagia, oral phase (R13.11) Plan: Continue  with current plan of care       Jettie Booze, Student SLP               Jettie Booze 04/16/2018, 10:53 AM

## 2018-04-17 ENCOUNTER — Inpatient Hospital Stay (HOSPITAL_COMMUNITY): Payer: Medicare Other

## 2018-04-17 LAB — CULTURE, BLOOD (ROUTINE X 2)
Culture: NO GROWTH
Culture: NO GROWTH
Special Requests: ADEQUATE

## 2018-04-17 LAB — BASIC METABOLIC PANEL
Anion gap: 13 (ref 5–15)
BUN: 5 mg/dL — ABNORMAL LOW (ref 8–23)
CO2: 26 mmol/L (ref 22–32)
Calcium: 8 mg/dL — ABNORMAL LOW (ref 8.9–10.3)
Chloride: 95 mmol/L — ABNORMAL LOW (ref 98–111)
Creatinine, Ser: 1.01 mg/dL — ABNORMAL HIGH (ref 0.44–1.00)
GFR calc Af Amer: >60 mL/min (ref >60)
GFR, EST NON AFRICAN AMERICAN: 58 mL/min — AB (ref >60)
Glucose, Bld: 351 mg/dL — ABNORMAL HIGH (ref 70–99)
POTASSIUM: 3 mmol/L — AB (ref 3.5–5.1)
SODIUM: 134 mmol/L — AB (ref 135–145)

## 2018-04-17 LAB — GLUCOSE, CAPILLARY
GLUCOSE-CAPILLARY: 266 mg/dL — AB (ref 70–99)
Glucose-Capillary: 347 mg/dL — ABNORMAL HIGH (ref 70–99)
Glucose-Capillary: 338 mg/dL — ABNORMAL HIGH (ref 70–99)
Glucose-Capillary: 293 mg/dL — ABNORMAL HIGH (ref 70–99)

## 2018-04-17 LAB — T3, FREE: T3, Free: 1.3 pg/mL — ABNORMAL LOW (ref 2.0–4.4)

## 2018-04-17 MED ORDER — AMOXICILLIN-POT CLAVULANATE 875-125 MG PO TABS: 1.0000 | ORAL_TABLET | Freq: Two times a day (BID) | ORAL | 0 refills | Status: DC

## 2018-04-17 MED ORDER — LEVOTHYROXINE SODIUM 25 MCG PO TABS: 25.0000 ug | ORAL_TABLET | Freq: Every day | ORAL | 0 refills | Status: DC

## 2018-04-17 MED ORDER — POTASSIUM CHLORIDE CRYS ER 20 MEQ PO TBCR
40.0000 meq | EXTENDED_RELEASE_TABLET | ORAL | Status: AC
  Administered 2018-04-17: 40 meq via ORAL
  Filled 2018-04-17 (×2): qty 2

## 2018-04-17 MED ORDER — SODIUM CHLORIDE 0.9 % IV SOLN
INTRAVENOUS | Status: DC
  Administered 2018-04-17 – 2018-04-18 (×2): via INTRAVENOUS

## 2018-04-17 MED ORDER — PANTOPRAZOLE SODIUM 40 MG IV SOLR
40.0000 mg | Freq: Two times a day (BID) | INTRAVENOUS | Status: DC
  Administered 2018-04-17 – 2018-04-19 (×5): 40 mg via INTRAVENOUS
  Filled 2018-04-17 (×5): qty 40

## 2018-04-17 MED ORDER — POTASSIUM CHLORIDE CRYS ER 20 MEQ PO TBCR: 20.0000 meq | EXTENDED_RELEASE_TABLET | Freq: Every day | ORAL | 0 refills | Status: DC

## 2018-04-17 MED ORDER — INSULIN ASPART 100 UNIT/ML ~~LOC~~ SOLN: 0.0000 [IU] | Freq: Three times a day (TID) | SUBCUTANEOUS | 11 refills | Status: DC

## 2018-04-17 MED ORDER — LEVOTHYROXINE SODIUM 25 MCG PO TABS
25.0000 ug | ORAL_TABLET | Freq: Every day | ORAL | Status: DC
  Administered 2018-04-17 – 2018-04-20 (×4): 25 ug via ORAL
  Filled 2018-04-17 (×4): qty 1

## 2018-04-17 MED ORDER — FERROUS SULFATE 325 (65 FE) MG PO TABS: 325.0000 mg | ORAL_TABLET | Freq: Two times a day (BID) | ORAL | 0 refills | Status: DC

## 2018-04-17 MED ORDER — INSULIN ASPART 100 UNIT/ML ~~LOC~~ SOLN
0.0000 [IU] | Freq: Three times a day (TID) | SUBCUTANEOUS | Status: DC
  Administered 2018-04-17: 5 [IU] via SUBCUTANEOUS
  Administered 2018-04-17 – 2018-04-18 (×3): 7 [IU] via SUBCUTANEOUS
  Administered 2018-04-18: 5 [IU] via SUBCUTANEOUS
  Administered 2018-04-18: 3 [IU] via SUBCUTANEOUS
  Administered 2018-04-19: 5 [IU] via SUBCUTANEOUS
  Administered 2018-04-19 (×2): 3 [IU] via SUBCUTANEOUS
  Administered 2018-04-20 (×2): 1 [IU] via SUBCUTANEOUS
  Administered 2018-04-20: 3 [IU] via SUBCUTANEOUS

## 2018-04-17 MED ORDER — PREDNISONE 20 MG PO TABS: ORAL_TABLET | ORAL | 0 refills | Status: DC

## 2018-04-17 MED ORDER — PREDNISONE 20 MG PO TABS
40.0000 mg | ORAL_TABLET | Freq: Every day | ORAL | Status: DC
  Filled 2018-04-17: qty 2

## 2018-04-17 MED ORDER — FOLIC ACID 1 MG PO TABS: 1.0000 mg | ORAL_TABLET | Freq: Every day | ORAL | 0 refills | Status: DC

## 2018-04-17 MED ORDER — ALBUTEROL SULFATE (2.5 MG/3ML) 0.083% IN NEBU: 2.5000 mg | INHALATION_SOLUTION | Freq: Two times a day (BID) | RESPIRATORY_TRACT | 12 refills | Status: DC

## 2018-04-17 NOTE — Progress Notes (Signed)
Notified on-call physician of patient with diabetes in her history, receiving solumedrol, blood sugars are  Climbing however no sliding scale orders. Will continue to monitor.

## 2018-04-17 NOTE — Discharge Summary (Addendum)
Physician Discharge Summary  Darlene Stafford OVZ:[id number] DOB: [birth year] DOA: 04/11/2018  PCP: Katherina Mires, MD  Admit date: 04/11/2018 Discharge date: 04/17/2018  Admitted From: Home  Disposition:  SNF  Recommendations for Outpatient Follow-up:  1. Follow up with PCP in 1-2 weeks 2. Please obtain BMP/CBC in one week 3. Needs repeat TSH and free T 4 and T 3.  4. follow hb due to anemia.    Discharge Condition: stable.  CODE STATUS: full code Diet recommendation:  Carb Modified   Brief/Interim Summary: year-old with past medical history significant for sarcoid, on chronic prednisone, recently weaned by pulmonologist of prednisone on 01/15/2019, asthma, chronic kidney disease stage IV, gout, morbid obesity, prior stroke diabetes recent hospitalization for sepsis secondary to E. coli bacteremia on 2/14 to 2/17.  Who presents to the emergency department with fever soft blood pressure, diagnosed with pneumonia.   Sepsis/PNA; patient presents with fever, cough.  CT with groundglass opacity on possible pneumonia. She received  IV vancomycin and cefepime for 3 days. Day 4 antibiotics.  Vancomycin stopped 3-11. Report SOB; Chest x ray repeated. Infiltrates stables.  Will treat for 7 days. Discharge on Augmentin for 3 more days.   Acute hypoxic respiratory failure: New oxygen requirement, history of asthma.  Patient oxygen saturation on room air at 85---88%. Likely related to pneumonia. Will need evaluation for home oxygen prior to discharge. Treated with IV solumedrol, taper prednisone. Over days taper to home dose.   Sarcoidosis; on IV stress dose of steroid. She was on chronic prednisone.    CKD Stage III; Creatinine 1.3/1.9. Atrophic right kidney.  No hydronephrosis, multiple renal calculus Metabolic acidosis. On sodium bicarb.  Creatinine decreased to 1.1 Urine culture no growth Replete low potassium.   DM type 2; Started SSI.  Suspect CBG will decreased with taper of  prednisone.  SSI at discharge  Acute on chronic debility;  PT OT consult.  Anemia, normocytic;  Folic acid level low started supplement.  Iron trial.  Hb increased to 8 on 3-13  Pressure Injury stage II buttock; Local care  Acute metabolic encephalopathy: Related to infection Confusion intermittent, less cooperative today, confuse.  CT head negative for acute stroke.  MRI negative for acute stroke, empty sell.  TSH low, started on supplement. Already on chronic prednisone.  Change  antibiotics from cefepime to ceftriaxone. Granddaughter notice worsening confusion after antibiotics.  She is alert, oriented , improving.   Hypothyroidism; empty sella;  She is already on prednisone.  Start synthroid. Needs repeat TSH in 4 weeks.   Hypokalemia;  Replete orally.   Discharge Diagnoses:  Principal Problem:   Sepsis ([place]) Active Problems:   Diabetes mellitus without complication ([place])   Sarcoidosis   CKD (chronic kidney disease), stage III ([facility])   Pressure injury of skin    Discharge Instructions  Discharge Instructions    Diet - low sodium heart healthy   Complete by:  As directed    Increase activity slowly   Complete by:  As directed      Allergies as of 04/17/2018      Reactions   Fish Allergy Anaphylaxis   Shellfish Allergy Anaphylaxis   Sulfa Antibiotics Hives, Itching      Medication List    TAKE these medications   albuterol 108 (90 Base) MCG/ACT inhaler Commonly known as:  PROVENTIL HFA;VENTOLIN HFA Inhale 1-2 puffs into the lungs every 4 (four) hours as needed for shortness of breath. What changed:  Another medication with the same name  was changed. Make sure you understand how and when to take each.   albuterol (2.5 MG/3ML) 0.083% nebulizer solution Commonly known as:  PROVENTIL Take 3 mLs (2.5 mg total) by nebulization 2 (two) times daily. What changed:    when to take this  reasons to take this   amoxicillin-clavulanate 875-125 MG  tablet Commonly known as:  Augmentin Take 1 tablet by mouth 2 (two) times daily for 3 days.   atorvastatin 40 MG tablet Commonly known as:  LIPITOR Take 40 mg by mouth daily.   carvedilol 12.5 MG tablet Commonly known as:  COREG Take 12.5 mg by mouth 2 (two) times daily with a meal.   ferrous sulfate 325 (65 FE) MG tablet Take 1 tablet (325 mg total) by mouth 2 (two) times daily with a meal.   folic acid 1 MG tablet Commonly known as:  FOLVITE Take 1 tablet (1 mg total) by mouth daily. Start taking on:  April 18, 2018   guaiFENesin 600 MG 12 hr tablet Commonly known as:  MUCINEX Take 1 tablet (600 mg total) by mouth 2 (two) times daily. What changed:    when to take this  reasons to take this   insulin aspart 100 UNIT/ML injection Commonly known as:  novoLOG Inject 0-9 Units into the skin 3 (three) times daily with meals. CBG < 70: implement hypoglycemia protocol CBG 70 - 120: 0 units CBG 121 - 150: 1 unit CBG 151 - 200: 2 units CBG 201 - 250: 3 units CBG 251 - 300: 5 units CBG 301 - 350: 7 units CBG 351 - 400 9 units   levothyroxine 25 MCG tablet Commonly known as:  SYNTHROID, LEVOTHROID Take 1 tablet (25 mcg total) by mouth daily at 6 (six) AM. Start taking on:  April 18, 2018   montelukast 10 MG tablet Commonly known as:  SINGULAIR Take 1 tablet (10 mg total) by mouth at bedtime.   omeprazole 20 MG capsule Commonly known as:  PRILOSEC Take 20 mg by mouth daily.   potassium chloride SA 20 MEQ tablet Commonly known as:  K-DUR,KLOR-CON Take 1 tablet (20 mEq total) by mouth daily for 2 days.   predniSONE 20 MG tablet Commonly known as:  Deltasone Take 40 mg daily for 3 days then 30 mg for 2 days then 20 mg daily for one day then 10 daily for one day then  prednisone 5 mg daily. Patient has been on chronic prednisone. What changed:    medication strength  how much to take  how to take this  when to take this  additional instructions        Allergies  Allergen Reactions  . Fish Allergy Anaphylaxis  . Shellfish Allergy Anaphylaxis  . Sulfa Antibiotics Hives and Itching    Consultations: none  Procedures/Studies: Dg Chest 2 View  Result Date: 04/15/2018 CLINICAL DATA:  Chest pain with shortness of breath. EXAM: CHEST - 2 VIEW COMPARISON:  04/12/2018 FINDINGS: Reticular opacities are identified throughout the RIGHT lung and associated with low lung volumes. Similar changes are identified at the LEFT placed to a lesser degree and the overall pattern is stable. No new consolidations or evidence for pulmonary edema. IMPRESSION: Stable appearance of chronic lung disease.  No acute abnormality. Electronically Signed   By: Nolon Nations M.D.   On: 04/15/2018 11:16   Dg Chest 2 View  Result Date: 03/19/2018 CLINICAL DATA:  Fever.  Sarcoidosis. EXAM: CHEST - 2 VIEW COMPARISON:  Chest x-rays dated 03/05/2018  and 04/30/2017 and chest CT dated 11/03/2016 FINDINGS: Heart size and pulmonary vascularity are normal. Extensive chronic changes in the right lung and to a lesser degree at the left lung base as demonstrated on prior chest x-rays and chest CT, consistent with the patient's history of sarcoidosis. No acute abnormalities. No effusions.  No bone abnormality. IMPRESSION: 1. Severe chronic lung disease. 2. No acute abnormalities. Electronically Signed   By: Lorriane Shire M.D.   On: 03/19/2018 13:30   Ct Head Wo Contrast  Result Date: 04/14/2018 CLINICAL DATA:  Acute onset of confusion. EXAM: CT HEAD WITHOUT CONTRAST TECHNIQUE: Contiguous axial images were obtained from the base of the skull through the vertex without intravenous contrast. COMPARISON:  None. FINDINGS: Brain: No evidence of acute infarction, hemorrhage, hydrocephalus, extra-axial collection or mass lesion / mass effect. Apparent encephalomalacia at the right cerebellar hemisphere is thought to reflect remote infarct, though would correlate for associated symptoms.  Prominence of the ventricles and sulci reflects mild cortical volume loss. Mild periventricular and subcortical white matter change likely reflects small vessel ischemic microangiopathy. The brainstem and fourth ventricle are within normal limits. The basal ganglia are unremarkable in appearance. The cerebral hemispheres demonstrate grossly normal gray-white differentiation. No mass effect or midline shift is seen. Vascular: No hyperdense vessel or unexpected calcification. Skull: There is no evidence of fracture; visualized osseous structures are unremarkable in appearance. Sinuses/Orbits: The orbits are within normal limits. There is opacification of the mastoid air cells bilaterally. The paranasal sinuses are well-aerated. Other: No significant soft tissue abnormalities are seen. IMPRESSION: 1. No definite acute intracranial pathology seen on CT. 2. Apparent encephalomalacia at the right cerebellar hemisphere is thought to reflect remote infarct, though would correlate for associated symptoms. 3. Mild cortical volume loss and scattered small vessel ischemic microangiopathy. 4. Opacification of the mastoid air cells bilaterally. Electronically Signed   By: Garald Balding M.D.   On: 04/14/2018 21:11   Ct Soft Tissue Neck W Contrast  Result Date: 04/12/2018 CLINICAL DATA:  Wheezing.  History of sarcoidosis and asthma. EXAM: CT NECK WITH CONTRAST TECHNIQUE: Multidetector CT imaging of the neck was performed using the standard protocol following the bolus administration of intravenous contrast. CONTRAST:  59mL OMNIPAQUE IOHEXOL 300 MG/ML  SOLN COMPARISON:  CT chest November 03, 2016 FINDINGS: Noisy image quality due to habitus, shoulders at the level of the upper neck. Mild motion degraded examination. PHARYNX AND LARYNX: Normal.  Widely patent airway. SALIVARY GLANDS: Normal. THYROID: Normal. LYMPH NODES: No lymphadenopathy by CT size criteria. VASCULAR: Moderate calcific atherosclerosis carotid siphon. Mild  calcific atherosclerosis carotid bifurcations. LIMITED INTRACRANIAL: Normal. VISUALIZED ORBITS: Normal. MASTOIDS AND VISUALIZED PARANASAL SINUSES: Bilateral mastoid effusions without air cell coalescence. SKELETON: Nonacute.  Calcified stylohyoid ligaments. UPPER CHEST: Small RIGHT pleural effusion with dense consolidation with some component of bronchiectasis. Calcified mediastinal lymphadenopathy. OTHER: Trace retropharyngeal effusion versus artifact. IMPRESSION: 1. Habitus limited examination. Trace retropharyngeal effusion versus artifact. Patent airway. 2. RIGHT pleural effusion with consolidation and bronchiectasis, incompletely evaluated. Calcified mediastinal lymphadenopathy. Electronically Signed   By: Elon Alas M.D.   On: 04/12/2018 02:43   Ct Chest W Contrast  Result Date: 04/12/2018 CLINICAL DATA:  Evaluate pleural effusion and SVC clot. EXAM: CT CHEST WITH CONTRAST TECHNIQUE: Multidetector CT imaging of the chest was performed during intravenous contrast administration. CONTRAST:  64mL OMNIPAQUE IOHEXOL 300 MG/ML  SOLN COMPARISON:  11/03/2016 FINDINGS: Cardiovascular: Normal heart size. Trace pericardial fluid or thickening at the apex. There is aortic and coronary atherosclerotic calcification.  There was clinical concern for SVC compromise. None of the nodes compress the SVC which shows no expansion or abnormal adjacent fat stranding. Mediastinum/Nodes: Calcified mediastinal adenopathy that is stable on correlates with sarcoid. Lungs/Pleura: Honeycombing in the right upper lobe with greater opacity than on prior (thicker walled airspaces). Similar pattern of patchy airspace opacity in the left upper lobe. Mild honeycombing along the lower left major fissure in the posterior left lung. Upper Abdomen: Chronically lobulated liver which may be from patient's sarcoidosis. Granulomatous calcifications in the spleen. Granulomatous type calcifications within deep liver drainage lymph nodes. Numerous  calculi within the scarred right kidney, new. There is a 6 mm stone at the right renal pelvis without hydronephrosis. Musculoskeletal: No acute or aggressive finding Technologist called because this is a enhanced scan but there is no contrast within the vessels. The same findings are present on the prior neck CT and 150 cc of contrast has likely been extravasated into the left forearm between the 2 exams. On exam the left forearm is swollen but minimally tender. Patient is neurovascular early intact. These results were called by telephone at the time of interpretation on 04/12/2018 at 4:49 am to Dr. Addison Lank , who verbally acknowledged these results. IMPRESSION: 1. Sarcoidosis with pleural and parenchymal opacity. Increased density in the affected right upper lobe, possible superimposed pneumonia. 2. Very scarred right kidney with multiple calculi that have developed since a 2018 CT. There is a 6 mm stone at the right renal pelvis with no hydronephrosis. 3. Extravasation of contrast into the left upper extremity (likely 150 cc). The arm is swollen but neurovascular exam intact. Electronically Signed   By: Monte Fantasia M.D.   On: 04/12/2018 04:50   Mr Brain Wo Contrast  Result Date: 04/15/2018 CLINICAL DATA:  Altered level of consciousness. History of chronic kidney disease, diabetes, stroke and sarcoidosis. EXAM: MRI HEAD WITHOUT CONTRAST TECHNIQUE: Multiplanar, multiecho pulse sequences of the brain and surrounding structures were obtained without intravenous contrast. Axial T2 FLAIR sequence not obtained due to patient motion. COMPARISON:  CT HEAD April 14, 2018 FINDINGS: Moderately motion degraded examination. INTRACRANIAL CONTENTS: No reduced diffusion to suggest acute ischemia. Moderately motion degraded SWI sequence limiting sensitivity for microhemorrhage, no lobar hematoma. RIGHT cerebellar encephalomalacia. Mild supratentorial parenchymal brain volume loss. No hydrocephalus. No hydrocephalus. No  suspicious parenchymal signal, masses, mass effect. No abnormal extra-axial fluid collections. No extra-axial masses. VASCULAR: Normal major intracranial vascular flow voids present at skull base. SKULL AND UPPER CERVICAL SPINE: Expanded empty sella. No suspicious calvarial bone marrow signal. Craniocervical junction maintained. SINUSES/ORBITS: Bilateral mastoid effusions.The included ocular globes and orbital contents are non-suspicious. OTHER: None. IMPRESSION: 1. Moderately motion degraded examination. No acute intracranial process. 2. RIGHT cerebellar encephalomalacia. Mild cerebral parenchymal brain volume loss. 3. Empty sella. Electronically Signed   By: Elon Alas M.D.   On: 04/15/2018 19:20   US Renal  Result Date: 04/12/2018 CLINICAL DATA:  Sepsis EXAM: RENAL / URINARY TRACT ULTRASOUND COMPLETE COMPARISON:  Contemporaneous chest CT.  02/03/2017 ultrasound FINDINGS: Right Kidney: Renal measurements: 5.5 x 3.4 x 3.5 cm = volume: 34 mL. Size asymmetry is related to severe upper pole scarring by CT. There also numerous calculi that are not seen on this scan. No hydronephrosis or perinephric collection. Left Kidney: Renal measurements: 10.9 x 5 x 6 cm = volume: 173 mL. Echogenicity within normal limits. No mass or hydronephrosis visualized. Bladder: Appears normal for degree of bladder distention. IMPRESSION: Advanced right renal scarring with multiple calculi by  preceding chest CT. No hydronephrosis. Electronically Signed   By: Monte Fantasia M.D.   On: 04/12/2018 05:07   Dg Chest Port 1 View  Result Date: 04/12/2018 CLINICAL DATA:  Patient being treated for sepsis. Fever. History of sarcoid. EXAM: PORTABLE CHEST 1 VIEW COMPARISON:  03/05/2018 and 03/19/2018 FINDINGS: Diffuse coarsened interstitial lung markings are again noted of the right lung and left lung base. Heart size is top normal with aortic atherosclerosis. Along the periphery of the right hemithorax is slightly more confluent and  minimally more prominent opacity that may reflect pleural small loculated effusion. Otherwise, no significant change. IMPRESSION: Slightly more prominent opacity along the periphery of the right upper thorax that may reflect a loculated pleural effusion. Coarsened interstitial lung markings involving much of the right lung and left lung base persist and likely reflect the patient's underlying history of sarcoid. Electronically Signed   By: Ashley Royalty M.D.   On: 04/12/2018 00:49     Subjective: Alert, oriented. Dyspnea much better   Discharge Exam: Vitals:   04/17/18 0521 04/17/18 0823  BP: 133/88 123/71  Pulse: 86 81  Resp: 17 18  Temp: 98.5 F (36.9 C) 97.6 F (36.4 C)  SpO2: 100% 97%     General: Pt is alert, awake, not in acute distress Cardiovascular: RRR, S1/S2 +, no rubs, no gallops Respiratory: CTA bilaterally, no wheezing, no rhonchi Abdominal: Soft, NT, ND, bowel sounds + Extremities: no edema, no cyanosis    The results of significant diagnostics from this hospitalization (including imaging, microbiology, ancillary and laboratory) are listed below for reference.     Microbiology: Recent Results (from the past 240 hour(s))  Blood Culture (routine x 2)     Status: None (Preliminary result)   Collection Time: 04/11/18 11:15 PM  Result Value Ref Range Status   Specimen Description BLOOD LEFT ARM  Final   Special Requests   Final    BOTTLES DRAWN AEROBIC AND ANAEROBIC Blood Culture adequate volume   Culture   Final    NO GROWTH 4 DAYS Performed at [facility], [address], [place], [place] [address]    Report Status PENDING  Incomplete  Blood Culture (routine x 2)     Status: None (Preliminary result)   Collection Time: 04/11/18 11:31 PM  Result Value Ref Range Status   Specimen Description BLOOD LEFT HAND  Final   Special Requests   Final    BOTTLES DRAWN AEROBIC ONLY Blood Culture results may not be optimal due to an inadequate volume of blood  received in culture bottles   Culture   Final    NO GROWTH 4 DAYS Performed at [facility], [place] [address] [place], [place] [address]    Report Status PENDING  Incomplete  Urine culture     Status: None   Collection Time: 04/12/18 12:56 AM  Result Value Ref Range Status   Specimen Description URINE, CATHETERIZED  Final   Special Requests NONE  Final   Culture   Final    NO GROWTH Performed at [facility] [address] [place],  [address]    Report Status 04/13/2018 FINAL  Final  Group A Strep by PCR     Status: None   Collection Time: 04/12/18  4:06 AM  Result Value Ref Range Status   Group A Strep by PCR NOT DETECTED NOT DETECTED Final    Comment: Performed at [facility], [place] [address], [place], [place]  [address]  MRSA PCR Screening     Status: Abnormal   Collection Time: 04/13/18  4:05 PM  Result Value Ref Range Status   MRSA by PCR POSITIVE (A) NEGATIVE Final    Comment:        The GeneXpert MRSA Assay (FDA approved for NASAL specimens only), is one component of a comprehensive MRSA colonization surveillance program. It is not intended to diagnose MRSA infection nor to guide or monitor treatment for MRSA infections. RESULT CALLED TO, READ BACK BY AND VERIFIED WITH: B PERSON RN [id number] 04/13/18 A BROWNING Performed at [facility], [place] [address], [place], [place] [address]      Labs: BNP (last 3 results) No results for input(s): BNP in the last 8760 hours. Basic Metabolic Panel: Recent Labs  Lab 04/13/18 0319 04/14/18 0357 04/15/18 0457 04/16/18 0321 04/17/18 0716  NA 137 140 138 136 134*  K 4.6 4.1 3.6 3.2* 3.0*  CL 105 108 107 103 95*  CO2 17* 21* 22 23 26   GLUCOSE 146* 152* 166* 269* 351*  BUN 7* 6* 5* <5* 5*  CREATININE 1.57* 1.27* 1.11* 1.09* 1.01*  CALCIUM 7.7* 7.9* 7.6* 7.8* 8.0*   Liver Function Tests: Recent Labs  Lab 04/11/18 2331  AST 20  ALT 7  ALKPHOS 72  BILITOT 2.4*  PROT 7.2  ALBUMIN 3.1*    Recent Labs  Lab 04/11/18 2331  LIPASE 25   Recent Labs  Lab 04/16/18 0321  AMMONIA 37*   CBC: Recent Labs  Lab 04/11/18 2331  04/12/18 0517 04/13/18 0319 04/14/18 0357 04/15/18 0457 04/16/18 0321  WBC 4.8  --  4.4 6.4 7.1 6.6 4.9  NEUTROABS 2.4  --   --  5.6  --   --   --   HGB 10.0*   < > 9.1* 8.5* 7.4* 7.4* 8.6*  HCT 33.2*   < > 31.7* 28.1* 24.6* 22.9* 26.9*  MCV 92.5  --  98.8 94.0 92.8 88.8 89.1  PLT 506*  --  274 498* 468* 414* 498*   < > = values in this interval not displayed.   Cardiac Enzymes: No results for input(s): CKTOTAL, CKMB, CKMBINDEX, TROPONINI in the last 168 hours. BNP: Invalid input(s): POCBNP CBG: Recent Labs  Lab 04/12/18 2122 04/13/18 0606 04/13/18 1054 04/13/18 2353 04/17/18 0845  GLUCAP 133* 149* 151* 146* 347*   D-Dimer No results for input(s): DDIMER in the last 72 hours. Hgb A1c No results for input(s): HGBA1C in the last 72 hours. Lipid Profile No results for input(s): CHOL, HDL, LDLCALC, TRIG, CHOLHDL, LDLDIRECT in the last 72 hours. Thyroid function studies Recent Labs    04/16/18 1515  TSH 0.218*  T3FREE 1.3*   Anemia work up No results for input(s): VITAMINB12, FOLATE, FERRITIN, TIBC, IRON, RETICCTPCT in the last 72 hours. Urinalysis    Component Value Date/Time   COLORURINE AMBER (A) 04/12/2018 0056   APPEARANCEUR CLEAR 04/12/2018 0056   LABSPEC 1.019 04/12/2018 0056   PHURINE 5.0 04/12/2018 0056   GLUCOSEU NEGATIVE 04/12/2018 0056   HGBUR NEGATIVE 04/12/2018 0056   BILIRUBINUR NEGATIVE 04/12/2018 0056   KETONESUR 20 (A) 04/12/2018 0056   PROTEINUR 30 (A) 04/12/2018 0056   NITRITE NEGATIVE 04/12/2018 0056   LEUKOCYTESUR NEGATIVE 04/12/2018 0056   Sepsis Labs Invalid input(s): PROCALCITONIN,  WBC,  LACTICIDVEN Microbiology Recent Results (from the past 240 hour(s))  Blood Culture (routine x 2)     Status: None (Preliminary result)   Collection Time: 04/11/18 11:15 PM  Result Value Ref Range Status    Specimen Description BLOOD LEFT ARM  Final   Special Requests   Final    BOTTLES DRAWN AEROBIC AND ANAEROBIC Blood Culture adequate volume   Culture   Final    NO GROWTH 4 DAYS Performed at [facility], [name] [place], [place] [address]    Report Status PENDING  Incomplete  Blood Culture (routine x 2)     Status: None (Preliminary result)   Collection Time: 04/11/18 11:31 PM  Result Value Ref Range Status   Specimen Description BLOOD LEFT HAND  Final   Special Requests   Final    BOTTLES DRAWN AEROBIC ONLY Blood Culture results may not be optimal due to an inadequate volume of blood received in culture bottles   Culture   Final    NO GROWTH 4 DAYS Performed at [facility], [place] [address] [place], [place] [address]    Report Status PENDING  Incomplete  Urine culture     Status: None   Collection Time: 04/12/18 12:56 AM  Result Value Ref Range Status   Specimen Description URINE, CATHETERIZED  Final   Special Requests NONE  Final   Culture   Final    NO GROWTH Performed at [facility] [address], [place], [place] [address]    Report Status 04/13/2018 FINAL  Final  Group A Strep by PCR     Status: None   Collection Time: 04/12/18  4:06 AM  Result Value Ref Range Status   Group A Strep by PCR NOT DETECTED NOT DETECTED Final    Comment: Performed at [facility], [place] [address], [place], [place] [address]  MRSA PCR Screening     Status: Abnormal   Collection Time: 04/13/18  4:05 PM  Result Value Ref Range Status   MRSA by PCR POSITIVE (A) NEGATIVE Final    Comment:        The GeneXpert MRSA Assay (FDA approved for NASAL specimens only), is one component of a comprehensive MRSA colonization surveillance program. It is not intended to diagnose MRSA infection nor to guide or monitor treatment for MRSA infections. RESULT CALLED TO, READ BACK BY AND VERIFIED WITH: B PERSON RN 1829 04/13/18 A BROWNING Performed at [facility], [place] [address] [place], [place] [address]      Time coordinating discharge: 40 minutes  SIGNED:   Elmarie Shiley, MD  [facility]

## 2018-04-17 NOTE — Progress Notes (Signed)
Patient with nausea, vomiting. Cancelled discharge for today. Check KUB. IV protonix.

## 2018-04-17 NOTE — Progress Notes (Signed)
Clinical Social Worker following patient for support and discharge needs. Patient at this time has bed at [facility] but is unable to transition to rehab until authorization is approved through [facility]. Per facility they started the auth Friday afternoon and are still awaiting for approval.   Rhea Pink, MSW,  LCSW [phone]

## 2018-04-18 LAB — CBC
HCT: 25.8 % — ABNORMAL LOW (ref 36.0–46.0)
Hemoglobin: 8.3 g/dL — ABNORMAL LOW (ref 12.0–15.0)
MCH: 27.8 pg (ref 26.0–34.0)
MCHC: 32.2 g/dL (ref 30.0–36.0)
MCV: 86.3 fL (ref 80.0–100.0)
NRBC: 0.7 % — AB (ref 0.0–0.2)
Platelets: 485 10*3/uL — ABNORMAL HIGH (ref 150–400)
RBC: 2.99 MIL/uL — ABNORMAL LOW (ref 3.87–5.11)
RDW: 14.1 % (ref 11.5–15.5)
WBC: 8.9 10*3/uL (ref 4.0–10.5)

## 2018-04-18 LAB — BASIC METABOLIC PANEL
Anion gap: 7 (ref 5–15)
BUN: 7 mg/dL — ABNORMAL LOW (ref 8–23)
CO2: 28 mmol/L (ref 22–32)
CREATININE: 0.88 mg/dL (ref 0.44–1.00)
Calcium: 7.1 mg/dL — ABNORMAL LOW (ref 8.9–10.3)
Chloride: 95 mmol/L — ABNORMAL LOW (ref 98–111)
GFR calc Af Amer: >60 mL/min (ref >60)
GFR calc non Af Amer: >60 mL/min (ref >60)
Glucose, Bld: 298 mg/dL — ABNORMAL HIGH (ref 70–99)
Potassium: 2.5 mmol/L — CL (ref 3.5–5.1)
Sodium: 130 mmol/L — ABNORMAL LOW (ref 135–145)

## 2018-04-18 LAB — GLUCOSE, CAPILLARY
Glucose-Capillary: 306 mg/dL — ABNORMAL HIGH (ref 70–99)
Glucose-Capillary: 233 mg/dL — ABNORMAL HIGH (ref 70–99)
Glucose-Capillary: 267 mg/dL — ABNORMAL HIGH (ref 70–99)
Glucose-Capillary: 214 mg/dL — ABNORMAL HIGH (ref 70–99)

## 2018-04-18 LAB — HEPATIC FUNCTION PANEL
ALK PHOS: 64 U/L (ref 38–126)
ALT: 8 U/L (ref 0–44)
AST: 14 U/L — ABNORMAL LOW (ref 15–41)
Albumin: 2.7 g/dL — ABNORMAL LOW (ref 3.5–5.0)
Bilirubin, Direct: 0.6 mg/dL — ABNORMAL HIGH (ref 0.0–0.2)
Indirect Bilirubin: 0 mg/dL — ABNORMAL LOW (ref 0.3–0.9)
Total Bilirubin: 0.6 mg/dL (ref 0.3–1.2)
Total Protein: 5.6 g/dL — ABNORMAL LOW (ref 6.5–8.1)

## 2018-04-18 LAB — MAGNESIUM: Magnesium: 1.3 mg/dL — ABNORMAL LOW (ref 1.7–2.4)

## 2018-04-18 LAB — LIPASE, BLOOD: Lipase: 32 U/L (ref 11–51)

## 2018-04-18 MED ORDER — POTASSIUM CHLORIDE IN NACL 20-0.9 MEQ/L-% IV SOLN
INTRAVENOUS | Status: DC
  Administered 2018-04-18 – 2018-04-19 (×3): via INTRAVENOUS
  Filled 2018-04-18 (×3): qty 1000

## 2018-04-18 MED ORDER — HYDROCORTISONE NA SUCCINATE PF 100 MG IJ SOLR
100.0000 mg | Freq: Three times a day (TID) | INTRAMUSCULAR | Status: DC
  Administered 2018-04-18 – 2018-04-19 (×4): 100 mg via INTRAVENOUS
  Filled 2018-04-18 (×4): qty 2

## 2018-04-18 MED ORDER — METOCLOPRAMIDE HCL 5 MG/ML IJ SOLN
5.0000 mg | Freq: Three times a day (TID) | INTRAMUSCULAR | Status: DC | PRN
  Administered 2018-04-20: 5 mg via INTRAVENOUS
  Filled 2018-04-18: qty 2

## 2018-04-18 MED ORDER — MAGNESIUM SULFATE 2 GM/50ML IV SOLN
2.0000 g | Freq: Once | INTRAVENOUS | Status: AC
  Administered 2018-04-18: 2 g via INTRAVENOUS
  Filled 2018-04-18: qty 50

## 2018-04-18 MED ORDER — POTASSIUM CHLORIDE 10 MEQ/100ML IV SOLN
10.0000 meq | INTRAVENOUS | Status: AC
  Administered 2018-04-18 (×6): 10 meq via INTRAVENOUS
  Filled 2018-04-18 (×6): qty 100

## 2018-04-18 MED ORDER — INSULIN GLARGINE 100 UNIT/ML ~~LOC~~ SOLN
10.0000 [IU] | Freq: Every day | SUBCUTANEOUS | Status: DC
  Administered 2018-04-18: 10 [IU] via SUBCUTANEOUS
  Filled 2018-04-18 (×2): qty 0.1

## 2018-04-18 NOTE — Progress Notes (Signed)
PT has critical Potassium of 2.5. MD notified.  Jerald Kief, RN

## 2018-04-18 NOTE — Progress Notes (Signed)
Chaplain provided AD education and materials.  Pt would like to list three family members as subsequent HCPOAs, primarily because brother is feeling "left out".  Chaplain explained that he could probably/reasonably be added as "Person C" since this is a Arboriculturist, but chaplain was not sure.  Chaplain left material and advised pt to contact [facility] during business hours if she wanted to complete the forms here.      04/18/18 1500  Clinical Encounter Type  Visited With Patient  Visit Type  (AD)  Consult/Referral To [facility]  Would patient like information on creating a mental health advance directive? Yes (MAU/Ambulatory/Procedural Areas - Information given)

## 2018-04-18 NOTE — Progress Notes (Addendum)
PROGRESS NOTE    Darlene Stafford  [id number]:[id number] DOB: 1950/12/06 DOA: 04/11/2018 PCP: Katherina Mires, MD    Brief Narrative 68 year old with past medical history significant for sarcoid, on chronic prednisone, recently weaned by pulmonologist of prednisone on 01/15/2019, asthma, chronic kidney disease stage IV, gout, morbid obesity, prior stroke diabetes recent hospitalization for sepsis secondary to E. coli bacteremia on 2/14 to 2/17.  Who presents to the emergency department with fever soft blood pressure, diagnosed with pneumonia.  Assessment & Plan:   Principal Problem:   Sepsis ([name]) Active Problems:   Diabetes mellitus without complication ([place])   Sarcoidosis   CKD (chronic kidney disease), stage III ([place])   Pressure injury of skin    Sepsis/PNA; patient presents with fever, cough.  CT with groundglass opacity on possible pneumonia. She received  IV vancomycin and cefepime for 3 days. Day 6 antibiotics.  Vancomycin stopped 3-11. Report SOB; Chest x ray repeated. Infiltrates stables.  Will treat for 7 days.   Acute hypoxic respiratory failure: New oxygen requirement, history of asthma.  Patient oxygen saturation on room air at 85---88%. Likely related to pneumonia. Will need evaluation for home oxygen prior to discharge. Need to transition to oral prednisone.   Hypokalemia;  Replete IV. 6 runs kcl.   Hypomagnesemia; replete mg level.   Nausea, vomiting.  She denies abdominal pain. Had BM. KUB negative for obstruction.  Check LFT, Lipase.  IV Protonix.  Change prednisone to hydrocortisone.  Treat hyperglycemia. PRN Reglan.   Sarcoidosis; on IV stress dose of steroid. She was on chronic prednisone.    CKD Stage III; Creatinine 1.3/1.9. Atrophic right kidney.  No hydronephrosis, multiple renal calculus Metabolic acidosis. On sodium bicarb.  Creatinine decreased to 1.1 Urine culture no growth  DM type 2; Refused blood sugar check  Acute on chronic debility;   PT OT consult.  Anemia, normocytic;  Folic acid level low started supplement.  Iron trial.  Hb increase today at 8.4.   Pressure Injury stage II buttock; Local care  Acute metabolic encephalopathy: Related to infection Confusion intermittent, less cooperative today, confuse.  CT head negative for acute stroke.  MRI negative for acute stroke, empty sell. TSH low, started on synthroid. . Already on chronic prednisone.  Change  antibiotics from cefepime to ceftriaxone. Granddaughter notice worsening confusion after antibiotics.  Confusion improved.   Hypothyroidism;  Suspect primary. Empty sell on MRI.  Started synthroid.    Pressure Injury 04/13/18 Stage II -  Partial thickness loss of dermis presenting as a shallow open ulcer with a red, pink wound bed without slough. multi small open areas on buttock pink base no drainage stage 2 (Active)  04/13/18 2030  Location: Buttocks  Location Orientation: Right;Left;Mid  Staging: Stage II -  Partial thickness loss of dermis presenting as a shallow open ulcer with a red, pink wound bed without slough.  Wound Description (Comments): multi small open areas on buttock pink base no drainage stage 2  Present on Admission:      Estimated body mass index is 40.19 kg/m as calculated from the following:   Height as of this encounter: 5\' 1"  (1.549 m).   Weight as of this encounter: 96.5 kg.   DVT prophylaxis: SCDs, Lovenox Code Status: Full code Family Communication: Care discussed with granddaughter  Disposition Plan: Need PT OT.  Continue with IV antibiotics  Consultants:   None   Procedures:   Antimicrobials:  Vancomycin 3-9----stop 3-11 Cefepime, Flagyl 3-09  Subjective: She is oriented, alert.  Develops nausea and vomiting yesterday. KUB negative, she had BM.  She vomited today.   Objective: Vitals:   04/17/18 2340 04/18/18 0245 04/18/18 0831 04/18/18 0851  BP: (!) 147/72 138/78 125/78 125/78  Pulse: 77 78 87 87  Resp:  (!) 26 (!) 22 18   Temp: 98.8 F (37.1 C) 99 F (37.2 C) (!) 97.5 F (36.4 C)   TempSrc: Oral Oral Oral   SpO2: 99% 100% 100%   Weight:  96.5 kg    Height:        Intake/Output Summary (Last 24 hours) at 04/18/2018 0852 Last data filed at 04/17/2018 1519 Gross per 24 hour  Intake 163.05 ml  Output 400 ml  Net -236.95 ml   Filed Weights   04/16/18 0341 04/17/18 0521 04/18/18 0245  Weight: 96.3 kg 96.5 kg 96.5 kg    Examination:  General exam: NAD Respiratory system: CTA Cardiovascular system: S 1, S 2 RRR Gastrointestinal system: BS present, soft, nt Central nervous system: alert, follows command.  Extremities: no edema   Data Reviewed: I have personally reviewed following labs and imaging studies  CBC: Recent Labs  Lab 04/11/18 2331  04/13/18 0319 04/14/18 0357 04/15/18 0457 04/16/18 0321 04/18/18 0743  WBC 4.8   < > 6.4 7.1 6.6 4.9 8.9  NEUTROABS 2.4  --  5.6  --   --   --   --   HGB 10.0*   < > 8.5* 7.4* 7.4* 8.6* 8.3*  HCT 33.2*   < > 28.1* 24.6* 22.9* 26.9* 25.8*  MCV 92.5   < > 94.0 92.8 88.8 89.1 86.3  PLT 506*   < > 498* 468* 414* 498* 485*   < > = values in this interval not displayed.   Basic Metabolic Panel: Recent Labs  Lab 04/13/18 0319 04/14/18 0357 04/15/18 0457 04/16/18 0321 04/17/18 0716  NA 137 140 138 136 134*  K 4.6 4.1 3.6 3.2* 3.0*  CL 105 108 107 103 95*  CO2 17* 21* 22 23 26   GLUCOSE 146* 152* 166* 269* 351*  BUN 7* 6* 5* <5* 5*  CREATININE 1.57* 1.27* 1.11* 1.09* 1.01*  CALCIUM 7.7* 7.9* 7.6* 7.8* 8.0*   GFR: Estimated Creatinine Clearance: 57.4 mL/min (A) (by C-G formula based on SCr of 1.01 mg/dL (H)). Liver Function Tests: Recent Labs  Lab 04/11/18 2331  AST 20  ALT 7  ALKPHOS 72  BILITOT 2.4*  PROT 7.2  ALBUMIN 3.1*   Recent Labs  Lab 04/11/18 2331  LIPASE 25   Recent Labs  Lab 04/16/18 0321  AMMONIA 37*   Coagulation Profile: Recent Labs  Lab 04/11/18 2331  INR 1.1   Cardiac Enzymes: No  results for input(s): CKTOTAL, CKMB, CKMBINDEX, TROPONINI in the last 168 hours. BNP (last 3 results) No results for input(s): PROBNP in the last 8760 hours. HbA1C: No results for input(s): HGBA1C in the last 72 hours. CBG: Recent Labs  Lab 04/17/18 0845 04/17/18 1124 04/17/18 1602 04/17/18 2127 04/18/18 0643  GLUCAP 347* 338* 293* 266* 306*   Lipid Profile: No results for input(s): CHOL, HDL, LDLCALC, TRIG, CHOLHDL, LDLDIRECT in the last 72 hours. Thyroid Function Tests: Recent Labs    04/16/18 1515  TSH 0.218*  FREET4 0.56*  T3FREE 1.3*   Anemia Panel: No results for input(s): VITAMINB12, FOLATE, FERRITIN, TIBC, IRON, RETICCTPCT in the last 72 hours. Sepsis Labs: Recent Labs  Lab 04/11/18 2330 04/11/18 2331 04/12/18 0517  PROCALCITON  --  <0.10  --  LATICACIDVEN 1.3  --  1.1    Recent Results (from the past 240 hour(s))  Blood Culture (routine x 2)     Status: None   Collection Time: 04/11/18 11:15 PM  Result Value Ref Range Status   Specimen Description BLOOD LEFT ARM  Final   Special Requests   Final    BOTTLES DRAWN AEROBIC AND ANAEROBIC Blood Culture adequate volume   Culture   Final    NO GROWTH 5 DAYS Performed at [facility], [address] [place], [place] [address]    Report Status 04/17/2018 FINAL  Final  Blood Culture (routine x 2)     Status: None   Collection Time: 04/11/18 11:31 PM  Result Value Ref Range Status   Specimen Description BLOOD LEFT HAND  Final   Special Requests   Final    BOTTLES DRAWN AEROBIC ONLY Blood Culture results may not be optimal due to an inadequate volume of blood received in culture bottles   Culture   Final    NO GROWTH 5 DAYS Performed at [facility], [place] [address], [place], [place] [address]    Report Status 04/17/2018 FINAL  Final  Urine culture     Status: None   Collection Time: 04/12/18 12:56 AM  Result Value Ref Range Status   Specimen Description URINE, CATHETERIZED  Final   Special  Requests NONE  Final   Culture   Final    NO GROWTH Performed at [facility] [address], [place], [place] [address]    Report Status 04/13/2018 FINAL  Final  Group A Strep by PCR     Status: None   Collection Time: 04/12/18  4:06 AM  Result Value Ref Range Status   Group A Strep by PCR NOT DETECTED NOT DETECTED Final    Comment: Performed at [facility], [place] [address] [place], [place] [address]  MRSA PCR Screening     Status: Abnormal   Collection Time: 04/13/18  4:05 PM  Result Value Ref Range Status   MRSA by PCR POSITIVE (A) NEGATIVE Final    Comment:        The GeneXpert MRSA Assay (FDA approved for NASAL specimens only), is one component of a comprehensive MRSA colonization surveillance program. It is not intended to diagnose MRSA infection nor to guide or monitor treatment for MRSA infections. RESULT CALLED TO, READ BACK BY AND VERIFIED WITH: B PERSON RN 1829 04/13/18 A BROWNING Performed at [facility], [place] [address], [place], [place] [address]          Radiology Studies: Dg Abd 1 View  Result Date: 04/17/2018 CLINICAL DATA:  Patient with epigastric pain for 3 days. EXAM: ABDOMEN - 1 VIEW COMPARISON:  CT abdomen pelvis 02/03/2017 FINDINGS: Fibrotic changes right lower lung. Relative paucity of bowel gas. No free intraperitoneal air. Lumbar spine degenerative changes. IMPRESSION: Nonobstructed bowel gas pattern. Electronically Signed   By: Lovey Newcomer M.D.   On: 04/17/2018 14:10        Scheduled Meds: . albuterol  2.5 mg Nebulization BID  . atorvastatin  40 mg Oral Daily  . carvedilol  12.5 mg Oral BID WC  . enoxaparin (LOVENOX) injection  40 mg Subcutaneous Q24H  . ferrous sulfate  325 mg Oral BID WC  . folic acid  1 mg Oral Daily  . hydrocortisone sod succinate (SOLU-CORTEF) inj  100 mg Intravenous Q8H  . insulin aspart  0-9 Units  Subcutaneous TID WC  . insulin glargine  10 Units Subcutaneous Daily  . levothyroxine  25 mcg  Oral Q0600  . montelukast  10 mg Oral QHS  . mupirocin ointment   Nasal BID  . pantoprazole (PROTONIX) IV  40 mg Intravenous Q12H   Continuous Infusions: . sodium chloride 50 mL/hr at 04/17/18 1519  . cefTRIAXone (ROCEPHIN)  IV 1 g (04/17/18 2141)     LOS: 6 days    Time spent: 35 minutes.     Elmarie Shiley, MD [facility] Pager [phone] If 7PM-7AM, please contact night-coverage [email or web address] Password [facility] 04/18/2018, 8:52 AM

## 2018-04-19 LAB — BASIC METABOLIC PANEL
Anion gap: 6 (ref 5–15)
BUN: 5 mg/dL — AB (ref 8–23)
CHLORIDE: 97 mmol/L — AB (ref 98–111)
CO2: 29 mmol/L (ref 22–32)
Calcium: 7.3 mg/dL — ABNORMAL LOW (ref 8.9–10.3)
Creatinine, Ser: 0.9 mg/dL (ref 0.44–1.00)
GFR calc Af Amer: >60 mL/min (ref >60)
GFR calc non Af Amer: >60 mL/min (ref >60)
Glucose, Bld: 268 mg/dL — ABNORMAL HIGH (ref 70–99)
POTASSIUM: 3.3 mmol/L — AB (ref 3.5–5.1)
Sodium: 132 mmol/L — ABNORMAL LOW (ref 135–145)

## 2018-04-19 LAB — HEMOGLOBIN A1C
Hgb A1c MFr Bld: 6.3 % — ABNORMAL HIGH (ref 4.8–5.6)
Mean Plasma Glucose: 134.11 mg/dL

## 2018-04-19 LAB — GLUCOSE, CAPILLARY
Glucose-Capillary: 252 mg/dL — ABNORMAL HIGH (ref 70–99)
Glucose-Capillary: 244 mg/dL — ABNORMAL HIGH (ref 70–99)
Glucose-Capillary: 229 mg/dL — ABNORMAL HIGH (ref 70–99)
Glucose-Capillary: 206 mg/dL — ABNORMAL HIGH (ref 70–99)

## 2018-04-19 LAB — MAGNESIUM: Magnesium: 1.3 mg/dL — ABNORMAL LOW (ref 1.7–2.4)

## 2018-04-19 MED ORDER — INSULIN GLARGINE 100 UNIT/ML ~~LOC~~ SOLN
15.0000 [IU] | Freq: Every day | SUBCUTANEOUS | Status: DC
  Administered 2018-04-19: 15 [IU] via SUBCUTANEOUS
  Filled 2018-04-19 (×2): qty 0.15

## 2018-04-19 MED ORDER — POTASSIUM CHLORIDE 10 MEQ/100ML IV SOLN
10.0000 meq | INTRAVENOUS | Status: AC
  Administered 2018-04-19 (×2): 10 meq via INTRAVENOUS
  Filled 2018-04-19: qty 100

## 2018-04-19 MED ORDER — POTASSIUM CHLORIDE 10 MEQ/100ML IV SOLN
10.0000 meq | INTRAVENOUS | Status: AC
  Administered 2018-04-19 (×2): 10 meq via INTRAVENOUS
  Filled 2018-04-19 (×2): qty 100

## 2018-04-19 MED ORDER — MAGNESIUM OXIDE 400 (241.3 MG) MG PO TABS
400.0000 mg | ORAL_TABLET | Freq: Two times a day (BID) | ORAL | Status: DC
  Administered 2018-04-19 – 2018-04-20 (×3): 400 mg via ORAL
  Filled 2018-04-19 (×3): qty 1

## 2018-04-19 MED ORDER — MAGNESIUM SULFATE 2 GM/50ML IV SOLN
2.0000 g | Freq: Once | INTRAVENOUS | Status: AC
  Administered 2018-04-19: 2 g via INTRAVENOUS
  Filled 2018-04-19: qty 50

## 2018-04-19 MED ORDER — POTASSIUM CHLORIDE CRYS ER 20 MEQ PO TBCR
20.0000 meq | EXTENDED_RELEASE_TABLET | Freq: Once | ORAL | Status: AC
  Administered 2018-04-19: 20 meq via ORAL
  Filled 2018-04-19: qty 1

## 2018-04-19 MED ORDER — PANTOPRAZOLE SODIUM 40 MG PO TBEC
40.0000 mg | DELAYED_RELEASE_TABLET | Freq: Two times a day (BID) | ORAL | Status: DC
  Administered 2018-04-19 – 2018-04-20 (×2): 40 mg via ORAL
  Filled 2018-04-19 (×2): qty 1

## 2018-04-19 MED ORDER — POTASSIUM CHLORIDE 10 MEQ/100ML IV SOLN
INTRAVENOUS | Status: AC
  Filled 2018-04-19: qty 100

## 2018-04-19 MED ORDER — PREDNISONE 20 MG PO TABS
40.0000 mg | ORAL_TABLET | Freq: Every day | ORAL | Status: DC
  Administered 2018-04-20: 40 mg via ORAL
  Filled 2018-04-19: qty 2

## 2018-04-19 NOTE — Progress Notes (Signed)
PROGRESS NOTE    Darlene Stafford  VVO:[id number] DOB: 1950-09-13 DOA: 04/11/2018 PCP: Katherina Mires, MD    Brief Narrative 68 year old with past medical history significant for sarcoid, on chronic prednisone, recently weaned by pulmonologist of prednisone on 01/15/2019, asthma, chronic kidney disease stage IV, gout, morbid obesity, prior stroke diabetes recent hospitalization for sepsis secondary to E. coli bacteremia on 2/14 to 2/17.  Who presents to the emergency department with fever soft blood pressure, diagnosed with pneumonia.  Assessment & Plan:   Principal Problem:   Sepsis ([place]) Active Problems:   Diabetes mellitus without complication ([place])   Sarcoidosis   CKD (chronic kidney disease), stage III ([place])   Pressure injury of skin    Sepsis/PNA; patient presents with fever, cough.  CT with groundglass opacity on possible pneumonia. She received  IV vancomycin and cefepime for 3 days. Day 6 antibiotics.  Vancomycin stopped 3-11. Report SOB; Chest x ray repeated. Infiltrates stables.  Will treat for 7 days.   Acute hypoxic respiratory failure: New oxygen requirement, history of asthma.  Patient oxygen saturation on room air at 85---88%. Likely related to pneumonia. Will need evaluation for home oxygen prior to discharge. Need to transition to oral prednisone.   Hypokalemia;  Received IV replacement. Continue with oral supplement.   Hypomagnesemia; replete mg level. IV and oral.   Nausea, vomiting.   She denies abdominal pain. Had BM. KUB negative for obstruction.   LFT, Lipase. Normal  IV Protonix.  Treat hyperglycemia. PRN Reglan.  Improved.   Sarcoidosis; on IV stress dose of steroid. She was on chronic prednisone.  Resume prednisone today. Stop IV hydrocortisone.   CKD Stage III; Creatinine 1.3/1.9. Atrophic right kidney.  No hydronephrosis, multiple renal calculus Metabolic acidosis. On sodium bicarb.  Creatinine decreased to 1.1 Urine culture no growth   DM type 2; Increase lantus.   Acute on chronic debility;  PT OT consult.  Anemia, normocytic;  Folic acid level low started supplement.  Iron trial.  Hb increase today at 8.4.   Pressure Injury stage II buttock; Local care  Acute metabolic encephalopathy: Related to infection Confusion intermittent, less cooperative today, confuse.  CT head negative for acute stroke.  MRI negative for acute stroke, empty sell. TSH low, started on synthroid. . Already on chronic prednisone.  Change  antibiotics from cefepime to ceftriaxone. Granddaughter notice worsening confusion after antibiotics.  Confusion improved.   Hypothyroidism;  Suspect primary. Empty sell on MRI.  Started synthroid.    Pressure Injury 04/13/18 Stage II -  Partial thickness loss of dermis presenting as a shallow open ulcer with a red, pink wound bed without slough. multi small open areas on buttock pink base no drainage stage 2 (Active)  04/13/18 2030  Location: Buttocks  Location Orientation: Right;Left;Mid  Staging: Stage II -  Partial thickness loss of dermis presenting as a shallow open ulcer with a red, pink wound bed without slough.  Wound Description (Comments): multi small open areas on buttock pink base no drainage stage 2  Present on Admission:      Estimated body mass index is 40.66 kg/m as calculated from the following:   Height as of this encounter: 5\' 1"  (1.549 m).   Weight as of this encounter: 97.6 kg.   DVT prophylaxis: SCDs, Lovenox Code Status: Full code Family Communication: Care discussed with granddaughter  Disposition Plan: Need PT OT.  Continue with IV antibiotics  Consultants:   None   Procedures:   Antimicrobials:  Vancomycin 3-9----stop 3-11  Cefepime, Flagyl 3-09  Subjective: She is feeling better, no further vomiting.   Objective: Vitals:   04/18/18 2355 04/19/18 0344 04/19/18 0838 04/19/18 1041  BP:  (!) 145/66  127/64  Pulse:  78  73  Resp:  15    Temp: 98.2 F  (36.8 C) 98.5 F (36.9 C)    TempSrc: Oral Oral    SpO2:  98% 98%   Weight:  97.6 kg    Height:        Intake/Output Summary (Last 24 hours) at 04/19/2018 1313 Last data filed at 04/19/2018 0800 Gross per 24 hour  Intake 3128.37 ml  Output 1050 ml  Net 2078.37 ml   Filed Weights   04/17/18 0521 04/18/18 0245 04/19/18 0344  Weight: 96.5 kg 96.5 kg 97.6 kg    Examination:  General exam: NAD Respiratory system: CTA Cardiovascular system: S 1, S 2 RRR Gastrointestinal system: BS present, soft, nt Central nervous system: Alert , follow command Extremities: no edema   Data Reviewed: I have personally reviewed following labs and imaging studies  CBC: Recent Labs  Lab 04/13/18 0319 04/14/18 0357 04/15/18 0457 04/16/18 0321 04/18/18 0743  WBC 6.4 7.1 6.6 4.9 8.9  NEUTROABS 5.6  --   --   --   --   HGB 8.5* 7.4* 7.4* 8.6* 8.3*  HCT 28.1* 24.6* 22.9* 26.9* 25.8*  MCV 94.0 92.8 88.8 89.1 86.3  PLT 498* 468* 414* 498* 767*   Basic Metabolic Panel: Recent Labs  Lab 04/15/18 0457 04/16/18 0321 04/17/18 0716 04/18/18 0743 04/19/18 0300  NA 138 136 134* 130* 132*  K 3.6 3.2* 3.0* 2.5* 3.3*  CL 107 103 95* 95* 97*  CO2 22 23 26 28 29   GLUCOSE 166* 269* 351* 298* 268*  BUN 5* <5* 5* 7* 5*  CREATININE 1.11* 1.09* 1.01* 0.88 0.90  CALCIUM 7.6* 7.8* 8.0* 7.1* 7.3*  MG  --   --   --  1.3* 1.3*   GFR: Estimated Creatinine Clearance: 64.8 mL/min (by C-G formula based on SCr of 0.9 mg/dL). Liver Function Tests: Recent Labs  Lab 04/18/18 0743  AST 14*  ALT 8  ALKPHOS 64  BILITOT 0.6  PROT 5.6*  ALBUMIN 2.7*   Recent Labs  Lab 04/18/18 0743  LIPASE 32   Recent Labs  Lab 04/16/18 0321  AMMONIA 37*   Coagulation Profile: No results for input(s): INR, PROTIME in the last 168 hours. Cardiac Enzymes: No results for input(s): CKTOTAL, CKMB, CKMBINDEX, TROPONINI in the last 168 hours. BNP (last 3 results) No results for input(s): PROBNP in the last 8760 hours.  HbA1C: No results for input(s): HGBA1C in the last 72 hours. CBG: Recent Labs  Lab 04/18/18 1201 04/18/18 1555 04/18/18 2158 04/19/18 0631 04/19/18 1206  GLUCAP 233* 267* 214* 252* 244*   Lipid Profile: No results for input(s): CHOL, HDL, LDLCALC, TRIG, CHOLHDL, LDLDIRECT in the last 72 hours. Thyroid Function Tests: Recent Labs    04/16/18 1515  TSH 0.218*  FREET4 0.56*  T3FREE 1.3*   Anemia Panel: No results for input(s): VITAMINB12, FOLATE, FERRITIN, TIBC, IRON, RETICCTPCT in the last 72 hours. Sepsis Labs: No results for input(s): PROCALCITON, LATICACIDVEN in the last 168 hours.  Recent Results (from the past 240 hour(s))  Blood Culture (routine x 2)     Status: None   Collection Time: 04/11/18 11:15 PM  Result Value Ref Range Status   Specimen Description BLOOD LEFT ARM  Final   Special Requests   Final  BOTTLES DRAWN AEROBIC AND ANAEROBIC Blood Culture adequate volume   Culture   Final    NO GROWTH 5 DAYS Performed at [facility], [place] [address] [place], [place] [address]    Report Status 04/17/2018 FINAL  Final  Blood Culture (routine x 2)     Status: None   Collection Time: 04/11/18 11:31 PM  Result Value Ref Range Status   Specimen Description BLOOD LEFT HAND  Final   Special Requests   Final    BOTTLES DRAWN AEROBIC ONLY Blood Culture results may not be optimal due to an inadequate volume of blood received in culture bottles   Culture   Final    NO GROWTH 5 DAYS Performed at [facility], [place] [address] [place], [place] [address]    Report Status 04/17/2018 FINAL  Final  Urine culture     Status: None   Collection Time: 04/12/18 12:56 AM  Result Value Ref Range Status   Specimen Description URINE, CATHETERIZED  Final   Special Requests NONE  Final   Culture   Final    NO GROWTH Performed at [facility] [address], [place], [place] [address]    Report Status 04/13/2018 FINAL  Final  Group A Strep by PCR     Status:  None   Collection Time: 04/12/18  4:06 AM  Result Value Ref Range Status   Group A Strep by PCR NOT DETECTED NOT DETECTED Final    Comment: Performed at [facility], [place] [address] [place], [place] [address]  MRSA PCR Screening     Status: Abnormal   Collection Time: 04/13/18  4:05 PM  Result Value Ref Range Status   MRSA by PCR POSITIVE (A) NEGATIVE Final    Comment:        The GeneXpert MRSA Assay (FDA approved for NASAL specimens only), is one component of a comprehensive MRSA colonization surveillance program. It is not intended to diagnose MRSA infection nor to guide or monitor treatment for MRSA infections. RESULT CALLED TO, READ BACK BY AND VERIFIED WITH: B PERSON RN 1829 04/13/18 A BROWNING Performed at [facility], [place] [address] [place],  [address]          Radiology Studies: Dg Abd 1 View  Result Date: 04/17/2018 CLINICAL DATA:  Patient with epigastric pain for 3 days. EXAM: ABDOMEN - 1 VIEW COMPARISON:  CT abdomen pelvis 02/03/2017 FINDINGS: Fibrotic changes right lower lung. Relative paucity of bowel gas. No free intraperitoneal air. Lumbar spine degenerative changes. IMPRESSION: Nonobstructed bowel gas pattern. Electronically Signed   By: Lovey Newcomer M.D.   On: 04/17/2018 14:10        Scheduled Meds: . albuterol  2.5 mg Nebulization BID  . atorvastatin  40 mg Oral Daily  . carvedilol  12.5 mg Oral BID WC  . enoxaparin (LOVENOX) injection  40 mg Subcutaneous Q24H  . ferrous sulfate  325 mg Oral BID WC  . folic acid  1 mg Oral Daily  . insulin aspart  0-9 Units Subcutaneous TID WC  . insulin glargine  15 Units Subcutaneous Daily  . levothyroxine  25 mcg Oral Q0600  . magnesium oxide  400 mg Oral BID  . montelukast  10 mg Oral QHS  . mupirocin ointment   Nasal BID  . pantoprazole  40 mg Oral BID  . predniSONE  40 mg Oral Q breakfast   Continuous Infusions: . 0.9 % NaCl with KCl  20 mEq / L 75 mL/hr at 04/19/18 0646  . potassium  chloride       LOS: 7 days    Time spent: 35 minutes.     Elmarie Shiley, MD [facility] Pager [phone] If 7PM-7AM, please contact night-coverage [email or web address] Password [email or web address] 04/19/2018, 1:13 PM

## 2018-04-19 NOTE — Progress Notes (Signed)
  Speech Language Pathology Treatment: Dysphagia  Patient Details Name: Darlene Stafford MRN: [id number] DOB: 1950-12-16 Today's Date: 04/19/2018 Time: 1050-1059 SLP Time Calculation (min) (ACUTE ONLY): 9 min  Assessment / Plan / Recommendation Clinical Impression  Pt was agreeable to minimal amounts of advanced soft solid trials. Her sustained attention is improved from initial evaluation, but she continues to have prolonged oral preparation. Although oral holding was not observed, she did have significant amounts of oral residue remaining post-swallow, primarily in her anterior and left sulci. She needed Min-Mod cues to effectively clear oral residue using liquid washes and lingual sweeps, and declined further soft solids. Thin liquids were consumed without overt difficulty. Would maintain current diet for now with additional SLP f/u indicated to facilitate diet advancement.   HPI HPI: Pt is a 68 yo female who presents with fever, sore throat, and emesis. CT chest showed possible PNA ni the RUL. She also had recent admission (2/14-2/17) for UTI. PMH: CVA, sarcoidosis, DM2, asthma      SLP Plan  Continue with current plan of care       Recommendations  Diet recommendations: Dysphagia 2 (fine chop);Thin liquid Liquids provided via: Straw;Cup Medication Administration: Whole meds with puree Supervision: Patient able to self feed;Full supervision/cueing for compensatory strategies Compensations: Slow rate;Small sips/bites;Follow solids with liquid Postural Changes and/or Swallow Maneuvers: Seated upright 90 degrees;Upright 30-60 min after meal                Oral Care Recommendations: Oral care BID Follow up Recommendations: Skilled Nursing facility SLP Visit Diagnosis: Dysphagia, oral phase (R13.11) Plan: Continue with current plan of care       GO                Venita [place] Quinton Voth 04/19/2018, 11:24 AM  Pollyann Glen, M.A. [facility] Acute Environmental education officer  [phone] Office [phone]

## 2018-04-19 NOTE — Progress Notes (Signed)
Inpatient Diabetes Program Recommendations  AACE/ADA: New Consensus Statement on Inpatient Glycemic Control (2015)  Target Ranges:  Prepandial:   less than 140 mg/dL      Peak postprandial:   less than 180 mg/dL (1-2 hours)      Critically ill patients:  140 - 180 mg/dL   Lab Results  Component Value Date   GLUCAP 252 (H) 04/19/2018   HGBA1C 5.8 (H) 04/11/2015    Review of Glycemic Control  Diabetes history: DM2 Outpatient Diabetes medications: None Current orders for Inpatient glycemic control: Lantus 10 units QD, Novolog 0-9 units tidwc  HgbA1C - 5.8%  Inpatient Diabetes Program Recommendations:     Increase Lantus to 15 units QD Add Novolog 3 units tidwc for meal coverage insulin while on steroids.  Will continue to follow.   Thank you. Lorenda Peck, RD, LDN, CDE Inpatient Diabetes Coordinator [phone]

## 2018-04-20 LAB — GLUCOSE, CAPILLARY
Glucose-Capillary: 141 mg/dL — ABNORMAL HIGH (ref 70–99)
Glucose-Capillary: 147 mg/dL — ABNORMAL HIGH (ref 70–99)
Glucose-Capillary: 231 mg/dL — ABNORMAL HIGH (ref 70–99)

## 2018-04-20 LAB — BASIC METABOLIC PANEL
Anion gap: 10 (ref 5–15)
BUN: 6 mg/dL — ABNORMAL LOW (ref 8–23)
CO2: 31 mmol/L (ref 22–32)
Calcium: 7.7 mg/dL — ABNORMAL LOW (ref 8.9–10.3)
Chloride: 92 mmol/L — ABNORMAL LOW (ref 98–111)
Creatinine, Ser: 0.84 mg/dL (ref 0.44–1.00)
GFR calc Af Amer: >60 mL/min (ref >60)
GFR calc non Af Amer: >60 mL/min (ref >60)
GLUCOSE: 136 mg/dL — AB (ref 70–99)
Potassium: 3.2 mmol/L — ABNORMAL LOW (ref 3.5–5.1)
Sodium: 133 mmol/L — ABNORMAL LOW (ref 135–145)

## 2018-04-20 MED ORDER — INSULIN GLARGINE 100 UNIT/ML ~~LOC~~ SOLN
10.0000 [IU] | Freq: Every day | SUBCUTANEOUS | Status: DC
  Administered 2018-04-20: 10 [IU] via SUBCUTANEOUS
  Filled 2018-04-20: qty 0.1

## 2018-04-20 MED ORDER — POTASSIUM CHLORIDE 10 MEQ/100ML IV SOLN
10.0000 meq | INTRAVENOUS | Status: AC
  Administered 2018-04-20 (×3): 10 meq via INTRAVENOUS
  Filled 2018-04-20 (×3): qty 100

## 2018-04-20 MED ORDER — INSULIN GLARGINE 100 UNIT/ML ~~LOC~~ SOLN: 10.0000 [IU] | Freq: Every day | SUBCUTANEOUS | 11 refills | Status: DC

## 2018-04-20 MED ORDER — MAGNESIUM OXIDE 400 (241.3 MG) MG PO TABS: 400.0000 mg | ORAL_TABLET | Freq: Two times a day (BID) | ORAL | 0 refills | Status: DC

## 2018-04-20 NOTE — Discharge Summary (Addendum)
Physician Discharge Summary  Darlene Stafford XVQ:[id number] DOB: 10/18/1950 DOA: 04/11/2018  PCP: Katherina Mires, MD  Admit date: 04/11/2018 Discharge date: 04/20/2018  Admitted From: Home  Disposition:  SNF  Recommendations for Outpatient Follow-up:  1. Follow up with PCP in 1-2 weeks 2. Please obtain BMP/CBC in one week 3. Monitor cbg, as prednisone is taper off, please adjust insulin regimen.  4. Repeat TSH in 4 weeks.     Discharge Condition:Stable.  CODE STATUS: full code Diet recommendation: Heart Healthy / Carb Modified  Brief/Interim Summary:  Sepsis/PNA; patient presents with fever, cough.  CT with groundglass opacity on possible pneumonia. She received  IV vancomycin and cefepime for 3 days. Subsequently antibiotics was change to ceftriaxone. She has completed 7 days of treatment.  Chest x ray repeated. Infiltrates stables.   Acute hypoxic respiratory failure: New oxygen requirement, history of asthma.  Patient oxygen saturation on room air at 85---88%. Likely related to pneumonia. Will need evaluation for home oxygen prior to discharge. On oral prednisone. Treated for PNA>   Hypokalemia;  Received IV replacement. Continue with oral supplement.  Labs pending   Hypomagnesemia; replete mg level. IV and oral.   Nausea, vomiting.   She denies abdominal pain. Had BM. KUB negative for obstruction.   LFT, Lipase. Normal  IV Protonix.  Treat hyperglycemia. PRN Reglan.  Resolved.   Sarcoidosis; on IV stress dose of steroid. She was on chronic prednisone.  Resume prednisone . On taper dose. Prior home dose 2.5 mg daily.   CKD Stage III; Creatinine 1.3/1.9. Atrophic right kidney.  No hydronephrosis, multiple renal calculus Metabolic acidosis. On sodium bicarb.  Creatinine decreased to 1.1 Urine culture no growth  Pre-Diabetes; Hyperglycemia in setting of Steroids.  She received Lantus and SSI. CBG better controlled.  As she is taper off prednisone will likely  need to stop lantus.   Acute on chronic debility;  PT OT consult. Needs SNF  Anemia, normocytic;  Folic acid level low started supplement.  Iron trial.  Hb increase today at 8.4.   Pressure Injury stage II buttock; Local care  Acute metabolic encephalopathy: Related to infection Confusion intermittent, less cooperative today, confuse.  CT head negative for acute stroke.  MRI negative for acute stroke, empty sell. TSH low, started on synthroid. . Already on chronic prednisone.  Change  antibiotics from cefepime to ceftriaxone. Granddaughter notice worsening confusion after antibiotics.  Confusion improved.   Hypothyroidism;  Suspect primary. Empty sell on MRI.  Started synthroid.    Discharge Diagnoses:  Principal Problem:   Sepsis ([place]) Active Problems:   Diabetes mellitus without complication ([place])   Sarcoidosis   CKD (chronic kidney disease), stage III ([facility])   Pressure injury of skin    Discharge Instructions  Discharge Instructions    Diet - low sodium heart healthy   Complete by:  As directed    Diet - low sodium heart healthy   Complete by:  As directed    Diet - low sodium heart healthy   Complete by:  As directed    Increase activity slowly   Complete by:  As directed    Increase activity slowly   Complete by:  As directed    Increase activity slowly   Complete by:  As directed      Allergies as of 04/20/2018      Reactions   Fish Allergy Anaphylaxis   Shellfish Allergy Anaphylaxis   Sulfa Antibiotics Hives, Itching      Medication List  TAKE these medications   albuterol 108 (90 Base) MCG/ACT inhaler Commonly known as:  PROVENTIL HFA;VENTOLIN HFA Inhale 1-2 puffs into the lungs every 4 (four) hours as needed for shortness of breath. What changed:  Another medication with the same name was changed. Make sure you understand how and when to take each.   albuterol (2.5 MG/3ML) 0.083% nebulizer solution Commonly known as:  PROVENTIL Take  3 mLs (2.5 mg total) by nebulization 2 (two) times daily. What changed:    when to take this  reasons to take this   atorvastatin 40 MG tablet Commonly known as:  LIPITOR Take 40 mg by mouth daily.   carvedilol 12.5 MG tablet Commonly known as:  COREG Take 12.5 mg by mouth 2 (two) times daily with a meal.   ferrous sulfate 325 (65 FE) MG tablet Take 1 tablet (325 mg total) by mouth 2 (two) times daily with a meal.   folic acid 1 MG tablet Commonly known as:  FOLVITE Take 1 tablet (1 mg total) by mouth daily.   guaiFENesin 600 MG 12 hr tablet Commonly known as:  MUCINEX Take 1 tablet (600 mg total) by mouth 2 (two) times daily. What changed:    when to take this  reasons to take this   insulin glargine 100 UNIT/ML injection Commonly known as:  LANTUS Inject 0.1 mLs (10 Units total) into the skin daily. Use lantus while patient is on high dose prednisone only   levothyroxine 25 MCG tablet Commonly known as:  SYNTHROID, LEVOTHROID Take 1 tablet (25 mcg total) by mouth daily at 6 (six) AM.   magnesium oxide 400 (241.3 Mg) MG tablet Commonly known as:  MAG-OX Take 1 tablet (400 mg total) by mouth 2 (two) times daily.   montelukast 10 MG tablet Commonly known as:  SINGULAIR Take 1 tablet (10 mg total) by mouth at bedtime.   omeprazole 20 MG capsule Commonly known as:  PRILOSEC Take 20 mg by mouth daily.   potassium chloride SA 20 MEQ tablet Commonly known as:  K-DUR,KLOR-CON Take 1 tablet (20 mEq total) by mouth daily for 2 days.   predniSONE 20 MG tablet Commonly known as:  Deltasone Take 40 mg daily for 3 days then 30 mg for 2 days then 20 mg daily for one day then 10 daily for one day then  prednisone 5 mg daily. Patient has been on chronic prednisone. What changed:    medication strength  how much to take  how to take this  when to take this  additional instructions       Allergies  Allergen Reactions  . Fish Allergy Anaphylaxis  . Shellfish  Allergy Anaphylaxis  . Sulfa Antibiotics Hives and Itching    Consultations:  none   Procedures/Studies: Dg Chest 2 View  Result Date: 04/15/2018 CLINICAL DATA:  Chest pain with shortness of breath. EXAM: CHEST - 2 VIEW COMPARISON:  04/12/2018 FINDINGS: Reticular opacities are identified throughout the RIGHT lung and associated with low lung volumes. Similar changes are identified at the LEFT placed to a lesser degree and the overall pattern is stable. No new consolidations or evidence for pulmonary edema. IMPRESSION: Stable appearance of chronic lung disease.  No acute abnormality. Electronically Signed   By: Nolon Nations M.D.   On: 04/15/2018 11:16   Dg Abd 1 View  Result Date: 04/17/2018 CLINICAL DATA:  Patient with epigastric pain for 3 days. EXAM: ABDOMEN - 1 VIEW COMPARISON:  CT abdomen pelvis 02/03/2017 FINDINGS: Fibrotic  changes right lower lung. Relative paucity of bowel gas. No free intraperitoneal air. Lumbar spine degenerative changes. IMPRESSION: Nonobstructed bowel gas pattern. Electronically Signed   By: Lovey Newcomer M.D.   On: 04/17/2018 14:10   Ct Head Wo Contrast  Result Date: 04/14/2018 CLINICAL DATA:  Acute onset of confusion. EXAM: CT HEAD WITHOUT CONTRAST TECHNIQUE: Contiguous axial images were obtained from the base of the skull through the vertex without intravenous contrast. COMPARISON:  None. FINDINGS: Brain: No evidence of acute infarction, hemorrhage, hydrocephalus, extra-axial collection or mass lesion / mass effect. Apparent encephalomalacia at the right cerebellar hemisphere is thought to reflect remote infarct, though would correlate for associated symptoms. Prominence of the ventricles and sulci reflects mild cortical volume loss. Mild periventricular and subcortical white matter change likely reflects small vessel ischemic microangiopathy. The brainstem and fourth ventricle are within normal limits. The basal ganglia are unremarkable in appearance. The  cerebral hemispheres demonstrate grossly normal gray-white differentiation. No mass effect or midline shift is seen. Vascular: No hyperdense vessel or unexpected calcification. Skull: There is no evidence of fracture; visualized osseous structures are unremarkable in appearance. Sinuses/Orbits: The orbits are within normal limits. There is opacification of the mastoid air cells bilaterally. The paranasal sinuses are well-aerated. Other: No significant soft tissue abnormalities are seen. IMPRESSION: 1. No definite acute intracranial pathology seen on CT. 2. Apparent encephalomalacia at the right cerebellar hemisphere is thought to reflect remote infarct, though would correlate for associated symptoms. 3. Mild cortical volume loss and scattered small vessel ischemic microangiopathy. 4. Opacification of the mastoid air cells bilaterally. Electronically Signed   By: Garald Balding M.D.   On: 04/14/2018 21:11   Ct Soft Tissue Neck W Contrast  Result Date: 04/12/2018 CLINICAL DATA:  Wheezing.  History of sarcoidosis and asthma. EXAM: CT NECK WITH CONTRAST TECHNIQUE: Multidetector CT imaging of the neck was performed using the standard protocol following the bolus administration of intravenous contrast. CONTRAST:  29mL OMNIPAQUE IOHEXOL 300 MG/ML  SOLN COMPARISON:  CT chest November 03, 2016 FINDINGS: Noisy image quality due to habitus, shoulders at the level of the upper neck. Mild motion degraded examination. PHARYNX AND LARYNX: Normal.  Widely patent airway. SALIVARY GLANDS: Normal. THYROID: Normal. LYMPH NODES: No lymphadenopathy by CT size criteria. VASCULAR: Moderate calcific atherosclerosis carotid siphon. Mild calcific atherosclerosis carotid bifurcations. LIMITED INTRACRANIAL: Normal. VISUALIZED ORBITS: Normal. MASTOIDS AND VISUALIZED PARANASAL SINUSES: Bilateral mastoid effusions without air cell coalescence. SKELETON: Nonacute.  Calcified stylohyoid ligaments. UPPER CHEST: Small RIGHT pleural effusion with  dense consolidation with some component of bronchiectasis. Calcified mediastinal lymphadenopathy. OTHER: Trace retropharyngeal effusion versus artifact. IMPRESSION: 1. Habitus limited examination. Trace retropharyngeal effusion versus artifact. Patent airway. 2. RIGHT pleural effusion with consolidation and bronchiectasis, incompletely evaluated. Calcified mediastinal lymphadenopathy. Electronically Signed   By: Elon Alas M.D.   On: 04/12/2018 02:43   Ct Chest W Contrast  Result Date: 04/12/2018 CLINICAL DATA:  Evaluate pleural effusion and SVC clot. EXAM: CT CHEST WITH CONTRAST TECHNIQUE: Multidetector CT imaging of the chest was performed during intravenous contrast administration. CONTRAST:  44mL OMNIPAQUE IOHEXOL 300 MG/ML  SOLN COMPARISON:  11/03/2016 FINDINGS: Cardiovascular: Normal heart size. Trace pericardial fluid or thickening at the apex. There is aortic and coronary atherosclerotic calcification. There was clinical concern for SVC compromise. None of the nodes compress the SVC which shows no expansion or abnormal adjacent fat stranding. Mediastinum/Nodes: Calcified mediastinal adenopathy that is stable on correlates with sarcoid. Lungs/Pleura: Honeycombing in the right upper lobe with greater opacity than  on prior (thicker walled airspaces). Similar pattern of patchy airspace opacity in the left upper lobe. Mild honeycombing along the lower left major fissure in the posterior left lung. Upper Abdomen: Chronically lobulated liver which may be from patient's sarcoidosis. Granulomatous calcifications in the spleen. Granulomatous type calcifications within deep liver drainage lymph nodes. Numerous calculi within the scarred right kidney, new. There is a 6 mm stone at the right renal pelvis without hydronephrosis. Musculoskeletal: No acute or aggressive finding Technologist called because this is a enhanced scan but there is no contrast within the vessels. The same findings are present on the  prior neck CT and 150 cc of contrast has likely been extravasated into the left forearm between the 2 exams. On exam the left forearm is swollen but minimally tender. Patient is neurovascular early intact. These results were called by telephone at the time of interpretation on 04/12/2018 at 4:49 am to Dr. Addison Lank , who verbally acknowledged these results. IMPRESSION: 1. Sarcoidosis with pleural and parenchymal opacity. Increased density in the affected right upper lobe, possible superimposed pneumonia. 2. Very scarred right kidney with multiple calculi that have developed since a 2018 CT. There is a 6 mm stone at the right renal pelvis with no hydronephrosis. 3. Extravasation of contrast into the left upper extremity (likely 150 cc). The arm is swollen but neurovascular exam intact. Electronically Signed   By: Monte Fantasia M.D.   On: 04/12/2018 04:50   Mr Brain Wo Contrast  Result Date: 04/15/2018 CLINICAL DATA:  Altered level of consciousness. History of chronic kidney disease, diabetes, stroke and sarcoidosis. EXAM: MRI HEAD WITHOUT CONTRAST TECHNIQUE: Multiplanar, multiecho pulse sequences of the brain and surrounding structures were obtained without intravenous contrast. Axial T2 FLAIR sequence not obtained due to patient motion. COMPARISON:  CT HEAD April 14, 2018 FINDINGS: Moderately motion degraded examination. INTRACRANIAL CONTENTS: No reduced diffusion to suggest acute ischemia. Moderately motion degraded SWI sequence limiting sensitivity for microhemorrhage, no lobar hematoma. RIGHT cerebellar encephalomalacia. Mild supratentorial parenchymal brain volume loss. No hydrocephalus. No hydrocephalus. No suspicious parenchymal signal, masses, mass effect. No abnormal extra-axial fluid collections. No extra-axial masses. VASCULAR: Normal major intracranial vascular flow voids present at skull base. SKULL AND UPPER CERVICAL SPINE: Expanded empty sella. No suspicious calvarial bone marrow signal.  Craniocervical junction maintained. SINUSES/ORBITS: Bilateral mastoid effusions.The included ocular globes and orbital contents are non-suspicious. OTHER: None. IMPRESSION: 1. Moderately motion degraded examination. No acute intracranial process. 2. RIGHT cerebellar encephalomalacia. Mild cerebral parenchymal brain volume loss. 3. Empty sella. Electronically Signed   By: Elon Alas M.D.   On: 04/15/2018 19:20   US Renal  Result Date: 04/12/2018 CLINICAL DATA:  Sepsis EXAM: RENAL / URINARY TRACT ULTRASOUND COMPLETE COMPARISON:  Contemporaneous chest CT.  02/03/2017 ultrasound FINDINGS: Right Kidney: Renal measurements: 5.5 x 3.4 x 3.5 cm = volume: 34 mL. Size asymmetry is related to severe upper pole scarring by CT. There also numerous calculi that are not seen on this scan. No hydronephrosis or perinephric collection. Left Kidney: Renal measurements: 10.9 x 5 x 6 cm = volume: 173 mL. Echogenicity within normal limits. No mass or hydronephrosis visualized. Bladder: Appears normal for degree of bladder distention. IMPRESSION: Advanced right renal scarring with multiple calculi by preceding chest CT. No hydronephrosis. Electronically Signed   By: Monte Fantasia M.D.   On: 04/12/2018 05:07   Dg Chest Port 1 View  Result Date: 04/12/2018 CLINICAL DATA:  Patient being treated for sepsis. Fever. History of sarcoid. EXAM: PORTABLE CHEST  1 VIEW COMPARISON:  03/05/2018 and 03/19/2018 FINDINGS: Diffuse coarsened interstitial lung markings are again noted of the right lung and left lung base. Heart size is top normal with aortic atherosclerosis. Along the periphery of the right hemithorax is slightly more confluent and minimally more prominent opacity that may reflect pleural small loculated effusion. Otherwise, no significant change. IMPRESSION: Slightly more prominent opacity along the periphery of the right upper thorax that may reflect a loculated pleural effusion. Coarsened interstitial lung markings  involving much of the right lung and left lung base persist and likely reflect the patient's underlying history of sarcoid. Electronically Signed   By: Ashley Royalty M.D.   On: 04/12/2018 00:49    Subjective: She is alert and oriented. No further vomiting. Feeling well.   Discharge Exam: Vitals:   04/20/18 0955 04/20/18 1445  BP: 114/74 137/74  Pulse: 68 80  Resp:  16  Temp:  97.8 F (36.6 C)  SpO2:  97%     General: Pt is alert, awake, not in acute distress Cardiovascular: RRR, S1/S2 +, no rubs, no gallops Respiratory: CTA bilaterally, no wheezing, no rhonchi Abdominal: Soft, NT, ND, bowel sounds + Extremities: no edema, no cyanosis    The results of significant diagnostics from this hospitalization (including imaging, microbiology, ancillary and laboratory) are listed below for reference.     Microbiology: Recent Results (from the past 240 hour(s))  Blood Culture (routine x 2)     Status: None   Collection Time: 04/11/18 11:15 PM  Result Value Ref Range Status   Specimen Description BLOOD LEFT ARM  Final   Special Requests   Final    BOTTLES DRAWN AEROBIC AND ANAEROBIC Blood Culture adequate volume   Culture   Final    NO GROWTH 5 DAYS Performed at [facility], [name], [place], [place] [address]    Report Status 04/17/2018 FINAL  Final  Blood Culture (routine x 2)     Status: None   Collection Time: 04/11/18 11:31 PM  Result Value Ref Range Status   Specimen Description BLOOD LEFT HAND  Final   Special Requests   Final    BOTTLES DRAWN AEROBIC ONLY Blood Culture results may not be optimal due to an inadequate volume of blood received in culture bottles   Culture   Final    NO GROWTH 5 DAYS Performed at [facility], [place] [address], [place], [place] [address]    Report Status 04/17/2018 FINAL  Final  Urine culture     Status: None   Collection Time: 04/12/18 12:56 AM  Result Value Ref Range Status   Specimen Description URINE, CATHETERIZED   Final   Special Requests NONE  Final   Culture   Final    NO GROWTH Performed at [facility] [address], [place],  [address]    Report Status 04/13/2018 FINAL  Final  Group A Strep by PCR     Status: None   Collection Time: 04/12/18  4:06 AM  Result Value Ref Range Status   Group A Strep by PCR NOT DETECTED NOT DETECTED Final    Comment: Performed at [facility], [place] [address], [place],  [address]  MRSA PCR Screening     Status: Abnormal   Collection Time: 04/13/18  4:05 PM  Result Value Ref Range Status   MRSA by PCR POSITIVE (A) NEGATIVE Final    Comment:        The  GeneXpert MRSA Assay (FDA approved for NASAL specimens only), is one component of a comprehensive MRSA colonization surveillance program. It is not intended to diagnose MRSA infection nor to guide or monitor treatment for MRSA infections. RESULT CALLED TO, READ BACK BY AND VERIFIED WITH: B PERSON RN [id number] 04/13/18 A BROWNING Performed at [facility], [place] [address], [place], [place] [address]      Labs: BNP (last 3 results) No results for input(s): BNP in the last 8760 hours. Basic Metabolic Panel: Recent Labs  Lab 04/16/18 0321 04/17/18 0716 04/18/18 0743 04/19/18 0300 04/20/18 0756  NA 136 134* 130* 132* 133*  K 3.2* 3.0* 2.5* 3.3* 3.2*  CL 103 95* 95* 97* 92*  CO2 23 26 28 29 31   GLUCOSE 269* 351* 298* 268* 136*  BUN <5* 5* 7* 5* 6*  CREATININE 1.09* 1.01* 0.88 0.90 0.84  CALCIUM 7.8* 8.0* 7.1* 7.3* 7.7*  MG  --   --  1.3* 1.3*  --    Liver Function Tests: Recent Labs  Lab 04/18/18 0743  AST 14*  ALT 8  ALKPHOS 64  BILITOT 0.6  PROT 5.6*  ALBUMIN 2.7*   Recent Labs  Lab 04/18/18 0743  LIPASE 32   Recent Labs  Lab 04/16/18 0321  AMMONIA 37*   CBC: Recent Labs  Lab 04/14/18 0357 04/15/18 0457 04/16/18 0321 04/18/18 0743  WBC 7.1 6.6 4.9 8.9  HGB 7.4* 7.4* 8.6* 8.3*  HCT 24.6* 22.9* 26.9* 25.8*  MCV 92.8 88.8 89.1 86.3  PLT 468*  414* 498* 485*   Cardiac Enzymes: No results for input(s): CKTOTAL, CKMB, CKMBINDEX, TROPONINI in the last 168 hours. BNP: Invalid input(s): POCBNP CBG: Recent Labs  Lab 04/19/18 1206 04/19/18 1708 04/19/18 2115 04/20/18 0630 04/20/18 1133  GLUCAP 244* 229* 206* 141* 147*   D-Dimer No results for input(s): DDIMER in the last 72 hours. Hgb A1c Recent Labs    04/19/18 1410  HGBA1C 6.3*   Lipid Profile No results for input(s): CHOL, HDL, LDLCALC, TRIG, CHOLHDL, LDLDIRECT in the last 72 hours. Thyroid function studies No results for input(s): TSH, T4TOTAL, T3FREE, THYROIDAB in the last 72 hours.  Invalid input(s): FREET3 Anemia work up No results for input(s): VITAMINB12, FOLATE, FERRITIN, TIBC, IRON, RETICCTPCT in the last 72 hours. Urinalysis    Component Value Date/Time   COLORURINE AMBER (A) 04/12/2018 0056   APPEARANCEUR CLEAR 04/12/2018 0056   LABSPEC 1.019 04/12/2018 0056   PHURINE 5.0 04/12/2018 0056   GLUCOSEU NEGATIVE 04/12/2018 0056   HGBUR NEGATIVE 04/12/2018 0056   BILIRUBINUR NEGATIVE 04/12/2018 0056   KETONESUR 20 (A) 04/12/2018 0056   PROTEINUR 30 (A) 04/12/2018 0056   NITRITE NEGATIVE 04/12/2018 0056   LEUKOCYTESUR NEGATIVE 04/12/2018 0056   Sepsis Labs Invalid input(s): PROCALCITONIN,  WBC,  LACTICIDVEN Microbiology Recent Results (from the past 240 hour(s))  Blood Culture (routine x 2)     Status: None   Collection Time: 04/11/18 11:15 PM  Result Value Ref Range Status   Specimen Description BLOOD LEFT ARM  Final   Special Requests   Final    BOTTLES DRAWN AEROBIC AND ANAEROBIC Blood Culture adequate volume   Culture   Final    NO GROWTH 5 DAYS Performed at [facility], [address], [place], [place] [address]    Report Status 04/17/2018 FINAL  Final  Blood Culture (routine x 2)     Status: None   Collection Time: 04/11/18 11:31 PM  Result Value Ref Range  Status   Specimen Description BLOOD LEFT HAND  Final   Special Requests    Final    BOTTLES DRAWN AEROBIC ONLY Blood Culture results may not be optimal due to an inadequate volume of blood received in culture bottles   Culture   Final    NO GROWTH 5 DAYS Performed at [facility], [place] [address], [place], [place] [address]    Report Status 04/17/2018 FINAL  Final  Urine culture     Status: None   Collection Time: 04/12/18 12:56 AM  Result Value Ref Range Status   Specimen Description URINE, CATHETERIZED  Final   Special Requests NONE  Final   Culture   Final    NO GROWTH Performed at [facility] [address] [place], [place] [address]    Report Status 04/13/2018 FINAL  Final  Group A Strep by PCR     Status: None   Collection Time: 04/12/18  4:06 AM  Result Value Ref Range Status   Group A Strep by PCR NOT DETECTED NOT DETECTED Final    Comment: Performed at [facility], [place] [address], [place], [place] [address]  MRSA PCR Screening     Status: Abnormal   Collection Time: 04/13/18  4:05 PM  Result Value Ref Range Status   MRSA by PCR POSITIVE (A) NEGATIVE Final    Comment:        The GeneXpert MRSA Assay (FDA approved for NASAL specimens only), is one component of a comprehensive MRSA colonization surveillance program. It is not intended to diagnose MRSA infection nor to guide or monitor treatment for MRSA infections. RESULT CALLED TO, READ BACK BY AND VERIFIED WITH: B PERSON RN 1829 04/13/18 A BROWNING Performed at [facility], [place] [address], [place],  [address]      Time coordinating discharge: 40 minutes  SIGNED:   Elmarie Shiley, MD  [facility]

## 2018-04-20 NOTE — Progress Notes (Signed)
Occupational Therapy Treatment Patient Details Name: Darlene Stafford MRN: [id number] DOB: 20-Dec-1950 Today's Date: 04/20/2018    History of present illness Patient is 68 y/o female admitted to hospital with fever and fatigue. Patient with sepsis secondary to pneumonia. MRI revealed R cerebellar encephalomalcia. Imaging also revealed R renal scarring. PMH includes DM, sarcoidosis, CVA and anemia.   OT comments  Pt making steady progress toward goals. Continue to recommend rehab at SNF to maximize functional level of independence and safe DC home.   Follow Up Recommendations  SNF;Supervision/Assistance - 24 hour    Equipment Recommendations  None recommended by OT    Recommendations for Other Services      Precautions / Restrictions Precautions Precautions: Fall Restrictions Weight Bearing Restrictions: No       Mobility Bed Mobility   General bed mobility comments: OOB in chair  Transfers Overall transfer level: Needs assistance Equipment used: Rolling walker (2 wheeled) Transfers: Sit to/from [facility] Sit to Stand: Min assist Stand pivot transfers: Min assist       General transfer comment: Took @ 5 steps to [name]. VC for safe hand placement    Balance Overall balance assessment: Needs assistance Sitting-balance support: Bilateral upper extremity supported;Feet supported Sitting balance-Leahy Scale: Fair    Standing balance support: Bilateral upper extremity supported Standing balance-Leahy Scale: Poor Standing balance comment: reliant on BUE support to maintain standing balance                           ADL either performed or assessed with clinical judgement   ADL Overall ADL's : Needs assistance/impaired                         Toilet Transfer: Minimal assistance;RW;Stand-pivot;BSC;Ambulation   Toileting- Clothing Manipulation and Hygiene: Minimal assistance;Sit to/from stand       Functional mobility during ADLs:  Minimal assistance;Rolling walker;Cueing for safety       Vision       Perception     Praxis      Cognition Arousal/Alertness: Awake/alert Behavior During Therapy: Flat affect Overall Cognitive Status: History of cognitive impairments - at baseline                   General Comments: per niece        Exercises Exercises: Other exercises Other Exercises Other Exercises: encouraged BUE AROM exercises while in chair/bed. Pt verbalized understanding.  Other Exercises: `   Shoulder Instructions       General Comments      Pertinent Vitals/ Pain       Pain Assessment: No/denies pain Faces Pain Scale: No hurt Pain Location: Complaint of nausea but no pain  Home Living                                          Prior Functioning/Environment              Frequency  Min 2X/week        Progress Toward Goals  OT Goals(current goals can now be found in the care plan section)  Progress towards OT goals: Progressing toward goals  Acute Rehab OT Goals Patient Stated Goal: to get stronger OT Goal Formulation: With patient Time For Goal Achievement: 04/29/18 Potential to Achieve Goals: Good ADL Goals Pt Will Perform Grooming: with  min guard assist;standing Pt Will Perform Upper Body Dressing: with supervision;with set-up;sitting Pt Will Perform Lower Body Dressing: with min assist;sit to/from stand Pt Will Transfer to Toilet: with min guard assist;ambulating;bedside commode Pt Will Perform Toileting - Clothing Manipulation and hygiene: with min assist;sit to/from stand  Plan Discharge plan remains appropriate    Co-evaluation                 AM-PAC OT "6 Clicks" Daily Activity     Outcome Measure   Help from another person eating meals?: A Little Help from another person taking care of personal grooming?: A Little Help from another person toileting, which includes using toliet, bedpan, or urinal?: A Little Help from another  person bathing (including washing, rinsing, drying)?: A Little Help from another person to put on and taking off regular upper body clothing?: A Little Help from another person to put on and taking off regular lower body clothing?: A Lot 6 Click Score: 17    End of Session Equipment Utilized During Treatment: Gait belt;Oxygen(2L)  OT Visit Diagnosis: Unsteadiness on feet (R26.81);Other abnormalities of gait and mobility (R26.89);Other symptoms and signs involving cognitive function;Muscle weakness (generalized) (M62.81)   Activity Tolerance Patient tolerated treatment well   Patient Left in chair;with call bell/phone within reach;with chair alarm set;with family/visitor present   Nurse Communication Mobility status        Time: 0388-8280 OT Time Calculation (min): 16 min  Charges: OT General Charges $OT Visit: 1 Visit OT Treatments $Self Care/Home Management : 8-22 mins  Maurie Boettcher, OT/L   Acute OT Clinical Specialist [place] Pager [phone] Office [phone]    [facility] 04/20/2018, 3:27 PM

## 2018-04-20 NOTE — TOC Transition Note (Signed)
Transition of Care [facility]) - CM/SW Discharge Note   Patient Details  Name: Darlene Stafford MRN: [id number] Date of Birth: [birth year]  Transition of Care [facility] CM/SW Contact:  Vinie Sill, [place] Phone Number: 04/20/2018, 12:52 PM   Clinical Narrative:    Patient will DC to: [place] Date: 04/20/2018 Family Notified: Georgina Snell, son Transport By: [facility] @ 4pm  RN, patient, and facility notified of DC. Discharge Summary sent to facility. RN given number for [phone], room 907-P. Ambulance transport requested for patient.   Clinical Social Worker signing off. Thurmond Butts, MSW, [facility] Clinical Social Worker [phone]   Final next level of care: Skilled Nursing Facility Barriers to Discharge: No Barriers Identified   Patient Goals and CMS Choice Patient states their goals for this hospitalization and ongoing recovery are:: "I want to go back home, no rehab" CMS Medicare.gov Compare Post Acute Care list provided to:: Patient Choice offered to / list presented to : Patient  Discharge Placement PASRR number recieved: 04/15/18            Patient chooses bed at: [facility] Patient to be transferred to facility by: [place] Name of family member notified: Georgina Snell, son Patient and family notified of of transfer: 04/20/18  Discharge Plan and Services Discharge Planning Services: CM Consult Post Acute Care Choice: [place] Agency: [place] ([place])   Social Determinants of Health (SDOH) Interventions     Readmission Risk Interventions No flowsheet data found.

## 2018-04-20 NOTE — Progress Notes (Signed)
I discussed plan of care with pt and niece/caregiver.  Pt aware that she needs IV potassium and to make sure she tolerates her lunch without nausea or vomiting. Pt ate lunch and sat in recliner for approximately 2 hours. Assisted pt to bedside commode and returned to bed. Pt getting last bag of IV potassium will call CSW and MD to make aware that pt will be ready for discharge. Pt resting with call bell within reach.  Will continue to monitor.

## 2018-04-20 NOTE — Progress Notes (Signed)
Physical Therapy Treatment Patient Details Name: Darlene Stafford MRN: [id number] DOB: [birth year] Today's Date: 04/20/2018    History of Present Illness Patient is 68 y/o female admitted to hospital with fever and fatigue. Patient with sepsis secondary to pneumonia. MRI revealed R cerebellar encephalomalcia. Imaging also revealed R renal scarring. PMH includes DM, sarcoidosis, CVA and anemia.    PT Comments    Pt progressing slowly towards their physical therapy goals. Requiring moderate assistance for most functional mobility. Able to perform stand pivot transfer from bed to chair using walker, but unable to ambulate. Continues with decreased awareness, attention, and difficulty sequencing/problem solving. SNF remains appropriate.    Follow Up Recommendations  SNF;Supervision/Assistance - 24 hour     Equipment Recommendations  Other (comment)(TBD at next venue)    Recommendations for Other Services       Precautions / Restrictions Precautions Precautions: Fall Restrictions Weight Bearing Restrictions: No    Mobility  Bed Mobility Overal bed mobility: Needs Assistance Bed Mobility: Supine to Sit     Supine to sit: Mod assist     General bed mobility comments: Mod assist for trunk elevation and use of bed pad to scoot hips to edge of bed  Transfers Overall transfer level: Needs assistance Equipment used: Rolling walker (2 wheeled) Transfers: Sit to/from [facility] Sit to Stand: Mod assist Stand pivot transfers: Min assist       General transfer comment: ModA to stand and minA for stand pivot from bed to chair. Needs cues for hand placement and upright posture   Ambulation/Gait                 Stairs             Wheelchair Mobility    Modified Rankin (Stroke Patients Only)       Balance Overall balance assessment: Needs assistance Sitting-balance support: Bilateral upper extremity supported;Feet supported Sitting balance-Leahy  Scale: Poor Sitting balance - Comments: Requiring close min guard due to one episode of LOB onto side while in bed   Standing balance support: Bilateral upper extremity supported Standing balance-Leahy Scale: Poor Standing balance comment: reliant on BUE support to maintain standing balance                            Cognition Arousal/Alertness: Awake/alert Behavior During Therapy: Flat affect Overall Cognitive Status: Impaired/Different from baseline                   Orientation Level: Disoriented to;Time;Situation   Memory: Decreased short-term memory Following Commands: Follows one step commands with increased time;Follows one step commands inconsistently     Problem Solving: Slow processing;Decreased initiation;Difficulty sequencing;Requires verbal cues;Requires tactile cues General Comments: Multimodal cues for sequencing with frequent redirection to task      Exercises      General Comments  VSS on 1L O2      Pertinent Vitals/Pain Pain Assessment: Faces Faces Pain Scale: No hurt Pain Location: Complaint of nausea but no pain    Home Living                      Prior Function            PT Goals (current goals can now be found in the care plan section) Acute Rehab PT Goals Patient Stated Goal: "get cleaned up" PT Goal Formulation: With patient Time For Goal Achievement: 04/27/18 Potential to Achieve Goals: Fair Progress  towards PT goals: Progressing toward goals    Frequency    Min 2X/week      PT Plan Current plan remains appropriate    Co-evaluation              AM-PAC PT "6 Clicks" Mobility   Outcome Measure  Help needed turning from your back to your side while in a flat bed without using bedrails?: A Little Help needed moving from lying on your back to sitting on the side of a flat bed without using bedrails?: A Little Help needed moving to and from a bed to a chair (including a wheelchair)?: A Little Help  needed standing up from a chair using your arms (e.g., wheelchair or bedside chair)?: A Lot Help needed to walk in hospital room?: A Lot Help needed climbing 3-5 steps with a railing? : Total 6 Click Score: 14    End of Session Equipment Utilized During Treatment: Oxygen Activity Tolerance: Patient tolerated treatment well Patient left: in chair;with call bell/phone within reach;with chair alarm set;with family/visitor present Nurse Communication: Mobility status PT Visit Diagnosis: Other abnormalities of gait and mobility (R26.89);Muscle weakness (generalized) (M62.81);Difficulty in walking, not elsewhere classified (R26.2)     Time: 6270-3500 PT Time Calculation (min) (ACUTE ONLY): 24 min  Charges:  $Therapeutic Activity: 23-37 mins                    Ellamae Sia, [place], DPT Acute Rehabilitation Services Pager [phone] Office [phone]    Willy Eddy 04/20/2018, 1:19 PM

## 2018-04-20 NOTE — Progress Notes (Signed)
[facility] and gave report to RN Otila Kluver. All questions answered. Receiving RN unable to take report but has call back number here if any questions. [facility] has transported patient to facility. Payton Emerald, RN

## 2018-05-07 ENCOUNTER — Encounter: Payer: Self-pay | Admitting: Internal Medicine

## 2018-07-05 ENCOUNTER — Ambulatory Visit: Payer: Medicare Other | Admitting: Adult Health

## 2018-07-05 ENCOUNTER — Ambulatory Visit (INDEPENDENT_AMBULATORY_CARE_PROVIDER_SITE_OTHER): Payer: Medicare Other | Admitting: Adult Health

## 2018-07-05 ENCOUNTER — Other Ambulatory Visit: Payer: Self-pay

## 2018-07-05 DIAGNOSIS — J189 Pneumonia, unspecified organism: Secondary | ICD-10-CM

## 2018-07-05 DIAGNOSIS — D869 Sarcoidosis, unspecified: Secondary | ICD-10-CM

## 2018-07-05 MED ORDER — ALBUTEROL SULFATE HFA 108 (90 BASE) MCG/ACT IN AERS: 1.0000 | INHALATION_SPRAY | RESPIRATORY_TRACT | 5 refills | Status: DC | PRN

## 2018-07-05 MED ORDER — PREDNISONE 5 MG PO TABS: 2.5000 mg | ORAL_TABLET | Freq: Every day | ORAL | 3 refills | Status: DC

## 2018-07-05 NOTE — Patient Instructions (Addendum)
Continue on Prednisone 2.5mg  daily .  Advance activity as tolerated.  Continue with home exercises as tolerated  Continue on Albuterol Nebs Twice daily  .  Follow with Dr. Halford Chessman  In 3 months and As needed   .Please contact office for sooner follow up if symptoms do not improve or worsen or seek emergency care

## 2018-07-05 NOTE — Progress Notes (Signed)
@Patient  ID: Darlene Stafford, female    DOB: 12/28/1950, 68 y.o.   MRN: [id number]  No chief complaint on file.   Referring provider: Katherina Mires, MD  HPI: 68 year old female never smoker followed for sarcoidosis on chronic prednisone, allergic asthma and upper airway cough syndrome  TEST/EVENTS :  RAST 09/30/16 >> multiple allergens, IgE 3120 HRCT chest 11/03/16 >> patchy septal thickening, honeycombing, and bronchiectasis PFT 11/10/16 >> FEV1 1.13 (73%), FEV1% 92, TLC 3.12 (71%), DLCO 76%, no BD  07/05/2018 Follow up : Sarcoid , [facility] follow up  Patient returns for a 13-month follow-up.  Patient has underlying sarcoidosis on chronic steroids with prednisone at 2.5 mg daily.  She has allergic asthma and upper airway cough syndrome.  She says overall her breathing is doing She has had a very stressful time over the last few months.. Patient was admitted twice earlier this year in February and in March. March admission admitted  with sepsis, pneumonia and acute hypoxic respiratory failure.  Last admission in March patient was treated with antibiotics and discharged on oxygen to SNF . [facility]  Admission in February was urosepsis and E. coli bacteremia. Treated with antibiotics. She says since then she is feeling better, it has taken a while . She is now back home . Trying to get stronger.  Doing home exercises .  Chest x-ray April 15, 2018 showed stable appearance of chronic lung disease.. she was discharged on oxygen to rehab but did not need it when she went home.  Today's oxygen level walking was on room air 93% .    Allergies  Allergen Reactions  . Fish Allergy Anaphylaxis  . Shellfish Allergy Anaphylaxis  . Sulfa Antibiotics Hives and Itching    Immunization History  Administered Date(s) Administered  . Influenza, High Dose Seasonal PF 02/04/2017, 10/19/2017  . Influenza, Seasonal, Injecte, Preservative Fre 02/08/2014  . Influenza,inj,Quad PF,6+ Mos 12/02/2011, 12/02/2012,  12/27/2014, 11/30/2015  . Pneumococcal Conjugate-13 02/08/2014  . Pneumococcal Polysaccharide-23 11/30/2015    Past Medical History:  Diagnosis Date  . Asthma   . CVA (cerebral vascular accident) ([place])   . Diabetes mellitus without complication ([place])   . Sarcoidosis of other sites    Ocular    Tobacco History: Social History   Tobacco Use  Smoking Status Never Smoker  Smokeless Tobacco Never Used   Counseling given: Not Answered   Outpatient Medications Prior to Visit  Medication Sig Dispense Refill  . albuterol (PROVENTIL HFA;VENTOLIN HFA) 108 (90 Base) MCG/ACT inhaler Inhale 1-2 puffs into the lungs every 4 (four) hours as needed for shortness of breath. 1 Inhaler 0  . albuterol (PROVENTIL) (2.5 MG/3ML) 0.083% nebulizer solution Take 3 mLs (2.5 mg total) by nebulization 2 (two) times daily. 75 mL 12  . atorvastatin (LIPITOR) 40 MG tablet Take 40 mg by mouth daily.    . carvedilol (COREG) 12.5 MG tablet Take 12.5 mg by mouth 2 (two) times daily with a meal.    . ferrous sulfate 325 (65 FE) MG tablet Take 1 tablet (325 mg total) by mouth 2 (two) times daily with a meal. 30 tablet 0  . folic acid (FOLVITE) 1 MG tablet Take 1 tablet (1 mg total) by mouth daily. 30 tablet 0  . guaiFENesin (MUCINEX) 600 MG 12 hr tablet Take 1 tablet (600 mg total) by mouth 2 (two) times daily. (Patient taking differently: Take 600 mg by mouth as needed for to loosen phlegm. ) 40 tablet 0  . levothyroxine (SYNTHROID,  LEVOTHROID) 25 MCG tablet Take 1 tablet (25 mcg total) by mouth daily at 6 (six) AM. 30 tablet 0  . magnesium oxide (MAG-OX) 400 (241.3 Mg) MG tablet Take 1 tablet (400 mg total) by mouth 2 (two) times daily. 30 tablet 0  . montelukast (SINGULAIR) 10 MG tablet Take 1 tablet (10 mg total) by mouth at bedtime. 30 tablet 5  . omeprazole (PRILOSEC) 20 MG capsule Take 20 mg by mouth daily.    . predniSONE (DELTASONE) 20 MG tablet Take 40 mg daily for 3 days then 30 mg for 2 days then 20 mg  daily for one day then 10 daily for one day then  prednisone 5 mg daily. Patient has been on chronic prednisone. 30 tablet 0  . insulin glargine (LANTUS) 100 UNIT/ML injection Inject 0.1 mLs (10 Units total) into the skin daily. Use lantus while patient is on high dose prednisone only (Patient not taking: Reported on 07/05/2018) 10 mL 11  . potassium chloride SA (K-DUR,KLOR-CON) 20 MEQ tablet Take 1 tablet (20 mEq total) by mouth daily for 2 days. 2 tablet 0   No facility-administered medications prior to visit.      Review of Systems:   Constitutional:   No  weight loss, night sweats,  Fevers, chills,  +fatigue, or  lassitude.  HEENT:   No headaches,  Difficulty swallowing,  Tooth/dental problems, or  Sore throat,                No sneezing, itching, ear ache, nasal congestion, post nasal drip,   CV:  No chest pain,  Orthopnea, PND, +swelling in lower extremities,  No anasarca, dizziness, palpitations, syncope.   GI  No heartburn, indigestion, abdominal pain, nausea, vomiting, diarrhea, change in bowel habits, loss of appetite, bloody stools.   Resp:  No excess mucus, no productive cough,  No non-productive cough,  No coughing up of blood.  No change in color of mucus.  No wheezing.  No chest wall deformity  Skin: no rash or lesions.  GU: no dysuria, change in color of urine, no urgency or frequency.  No flank pain, no hematuria   MS:  No joint pain or swelling.  No decreased range of motion.  No back pain.    Physical Exam  BP 136/66 (BP Location: Left Arm, Cuff Size: Normal)   Pulse 63   SpO2 100%   GEN: A/Ox3; pleasant , NAD, elderly    HEENT:  /AT,  EACs-clear, TMs-wnl, NOSE-clear, THROAT-clear, no lesions, no postnasal drip or exudate noted.   NECK:  Supple w/ fair ROM; no JVD; normal carotid impulses w/o bruits; no thyromegaly or nodules palpated; no lymphadenopathy.    RESP  Clear  P & A; w/o, wheezes/ rales/ or rhonchi. no accessory muscle use, no dullness to  percussion  CARD:  RRR, no m/r/g, tr  peripheral edema, pulses intact, no cyanosis or clubbing.  GI:   Soft & nt; nml bowel sounds; no organomegaly or masses detected.   Musco: Warm bil, no deformities or joint swelling noted.   Neuro: alert, no focal deficits noted.    Skin: Warm, no lesions or rashes    Lab Results:  CBC  BMET  ProBNP No results found for: PROBNP  Imaging: No results found.    PFT Results Latest Ref Rng & Units 11/10/2016  FVC-Pre L 1.26  FVC-Predicted Pre % 62  FVC-Post L 1.23  FVC-Predicted Post % 61  Pre FEV1/FVC % % 89  Post FEV1/FCV % %  92  FEV1-Pre L 1.12  FEV1-Predicted Pre % 72  FEV1-Post L 1.13  DLCO UNC% % 76  DLCO COR %Predicted % 113  TLC L 3.12  TLC % Predicted % 71  RV % Predicted % 91    No results found for: NITRICOXIDE      Assessment & Plan:   No problem-specific Assessment & Plan notes found for this encounter.     Rexene Edison, NP 07/05/2018

## 2018-07-06 NOTE — Assessment & Plan Note (Signed)
Clinically improved . Follow up CXR on 3/12 showed no acute process , stable chronic lung dz   Plan  Patient Instructions  Continue on Prednisone 2.5mg  daily .  Advance activity as tolerated.  Continue with home exercises as tolerated  Continue on Albuterol Nebs Twice daily  .  Follow with Dr. Halford Chessman  In 3 months and As needed   .Please contact office for sooner follow up if symptoms do not improve or worsen or seek emergency care

## 2018-07-06 NOTE — Assessment & Plan Note (Signed)
Currently stable   Plan  Patient Instructions  Continue on Prednisone 2.5mg  daily .  Advance activity as tolerated.  Continue with home exercises as tolerated  Continue on Albuterol Nebs Twice daily  .  Follow with Dr. Halford Chessman  In 3 months and As needed   .Please contact office for sooner follow up if symptoms do not improve or worsen or seek emergency care

## 2018-07-16 ENCOUNTER — Ambulatory Visit: Payer: Medicare Other | Admitting: Pulmonary Disease

## 2018-10-01 ENCOUNTER — Other Ambulatory Visit: Payer: Self-pay

## 2018-10-01 ENCOUNTER — Encounter: Payer: Self-pay | Admitting: Pulmonary Disease

## 2018-10-01 ENCOUNTER — Telehealth: Payer: Self-pay | Admitting: Pulmonary Disease

## 2018-10-01 ENCOUNTER — Ambulatory Visit (INDEPENDENT_AMBULATORY_CARE_PROVIDER_SITE_OTHER): Payer: Medicare Other | Admitting: Pulmonary Disease

## 2018-10-01 DIAGNOSIS — R058 Other specified cough: Secondary | ICD-10-CM

## 2018-10-01 DIAGNOSIS — J4550 Severe persistent asthma, uncomplicated: Secondary | ICD-10-CM

## 2018-10-01 DIAGNOSIS — R05 Cough: Secondary | ICD-10-CM

## 2018-10-01 DIAGNOSIS — Z7189 Other specified counseling: Secondary | ICD-10-CM | POA: Diagnosis not present

## 2018-10-01 DIAGNOSIS — D869 Sarcoidosis, unspecified: Secondary | ICD-10-CM | POA: Diagnosis not present

## 2018-10-01 MED ORDER — ALBUTEROL SULFATE (2.5 MG/3ML) 0.083% IN NEBU: 2.5000 mg | INHALATION_SOLUTION | Freq: Two times a day (BID) | RESPIRATORY_TRACT | 2 refills | Status: DC

## 2018-10-01 MED ORDER — PREDNISONE 1 MG PO TABS: ORAL_TABLET | ORAL | 0 refills | Status: AC

## 2018-10-01 NOTE — Progress Notes (Signed)
[facility]  Chief Complaint  Patient presents with  . Follow-up    F/U re: Sarcoid. Currenlty on 2.5mg  of prednisone daily. No voiced concerns with breathing.     Constitutional:  There were no vitals taken for this visit.  Deferred.  Past Medical History:  DM, CVA, CKD 3, Gout  Brief Summary:  Darlene Stafford is a 68 y.o. female with sarcoidosis on chronic prednisone, allergic asthma, and upper airway cough syndrome.  Virtual Visit via Telephone Note  I connected with [name] on 10/01/18 at  9:45 AM EDT by telephone and verified that I am speaking with the correct person using two identifiers.  Location: Patient: home Provider: medical office   I discussed the limitations, risks, security and privacy concerns of performing an evaluation and management service by telephone and the availability of in person appointments. I also discussed with the patient that there may be a patient responsible charge related to this service. The patient expressed understanding and agreed to proceed.   She was in hospital in February with UTI and March with pneumonia.  Doing better since then.  Started getting some sinus congestion over past week from allergies.  Had tele visit with PCP earlier this month and resumed signulair and cetirizine.  Doing better.  Remains on 2.5 mg prednisone.  Has been using albuterol bid > she thoughts this was how it was prescribed, but doesn't feel like she needs to use this much.  Not having chest pain, wheeze, hemoptysis, skin rash.  Hasn't seen eye doctor recently > concerned about going out during pandemic.  Family is trying to set up beach trip in September > she is very nervous about this.  Physical Exam:  Deferred.  Assessment/Plan:   Systemic sarcoidosis. - will continue to gradually wean off prednisone - change to 2 mg daily for 4 weeks, then 1 mg daily for 4 weeks, then stop prednisone - f/u with  ophthalmology  Allergic asthma with elevated IgE. - continue singulair - discussed indications when to use albuterol - continue cetirizine - might need to restart ICS as she weans off prednisone  Upper airway cough syndrome with post nasal drip. - singulair, cetirizine  Advice given about COVID 19. - advised against her going to beach trip - explained she would be a significant risk for complications including death if she contracted COVID infection   Patient Instructions  Change prednisone to 2 mg daily for 28 days, then 1 mg daily for 28 days, then stop prednisone  Follow up in 4 months     I discussed the assessment and treatment plan with the patient. The patient was provided an opportunity to ask questions and all were answered. The patient agreed with the plan and demonstrated an understanding of the instructions.   The patient was advised to call back or seek an in-person evaluation if the symptoms worsen or if the condition fails to improve as anticipated.  I provided 24 minutes of non-face-to-face time during this encounter.   Chesley Mires, MD   Chesley Mires, MD [facility] Pager: [phone] 10/01/2018, 10:29 AM  Flow Sheet     Pulmonary tests:  RAST 09/30/16 >> multiple allergens, IgE 3120 PFT 11/10/16 >> FEV1 1.13 (73%), FEV1% 92, TLC 3.12 (71%), DLCO 76%, no BD  Chest imaging:  HRCT chest 11/03/16 >> patchy septal thickening, honeycombing, and bronchiectasis CT chest 04/12/18 >> atherosclerosis, calcified mediastinal LN, honeycombing RUL, patchy LUL ASD, honeycombing posterior Lt lung,  calcified granuloma in spleen, renal stones (reviewed by me)  Cardiac testing:  Echo 03/20/18 >> EF greater than 65%, normal RV systolic function  Medications:   Allergies as of 10/01/2018      Reactions   Fish Allergy Anaphylaxis   Shellfish Allergy Anaphylaxis   Sulfa Antibiotics Hives, Itching      Medication List       Accurate as of  October 01, 2018 10:29 AM. If you have any questions, ask your nurse or doctor.        albuterol (2.5 MG/3ML) 0.083% nebulizer solution Commonly known as: PROVENTIL Take 3 mLs (2.5 mg total) by nebulization 2 (two) times daily.   albuterol 108 (90 Base) MCG/ACT inhaler Commonly known as: VENTOLIN HFA Inhale 1-2 puffs into the lungs every 4 (four) hours as needed for shortness of breath.   atorvastatin 40 MG tablet Commonly known as: LIPITOR Take 40 mg by mouth daily.   carvedilol 12.5 MG tablet Commonly known as: COREG Take 12.5 mg by mouth 2 (two) times daily with a meal.   ferrous sulfate 325 (65 FE) MG tablet Take 1 tablet (325 mg total) by mouth 2 (two) times daily with a meal.   folic acid 1 MG tablet Commonly known as: FOLVITE Take 1 tablet (1 mg total) by mouth daily.   guaiFENesin 600 MG 12 hr tablet Commonly known as: MUCINEX Take 1 tablet (600 mg total) by mouth 2 (two) times daily. What changed:   when to take this  reasons to take this   insulin glargine 100 UNIT/ML injection Commonly known as: LANTUS Inject 0.1 mLs (10 Units total) into the skin daily. Use lantus while patient is on high dose prednisone only   levothyroxine 25 MCG tablet Commonly known as: SYNTHROID Take 1 tablet (25 mcg total) by mouth daily at 6 (six) AM.   magnesium oxide 400 (241.3 Mg) MG tablet Commonly known as: MAG-OX Take 1 tablet (400 mg total) by mouth 2 (two) times daily.   montelukast 10 MG tablet Commonly known as: SINGULAIR Take 1 tablet (10 mg total) by mouth at bedtime.   omeprazole 20 MG capsule Commonly known as: PRILOSEC Take 20 mg by mouth daily.   potassium chloride SA 20 MEQ tablet Commonly known as: K-DUR Take 1 tablet (20 mEq total) by mouth daily for 2 days.   predniSONE 1 MG tablet Commonly known as: DELTASONE Take 2 tablets (2 mg total) by mouth daily with breakfast for 28 days, THEN 1 tablet (1 mg total) daily with breakfast for 28 days. Start  taking on: October 01, 2018 What changed:   medication strength  See the new instructions. Changed by: Chesley Mires, MD       Past Surgical History:  She  has a past surgical history that includes Replacement total knee (2015) and Partial hysterectomy (1998).  Family History:  Her family history includes Breast cancer in her sister; Sarcoidosis in her sister, sister, and sister.  Social History:  She  reports that she has never smoked. She has never used smokeless tobacco. She reports that she does not drink alcohol or use drugs.

## 2018-10-01 NOTE — Telephone Encounter (Signed)
Spoke with the pt and notified I have refilled her albuterol nebs  Nothing further needed

## 2018-10-01 NOTE — Patient Instructions (Signed)
Change prednisone to 2 mg daily for 28 days, then 1 mg daily for 28 days, then stop prednisone  Follow up in 4 months

## 2018-10-05 ENCOUNTER — Other Ambulatory Visit: Payer: Self-pay

## 2018-10-05 ENCOUNTER — Emergency Department (HOSPITAL_COMMUNITY)
Admission: EM | Admit: 2018-10-05 | Discharge: 2018-10-05 | Disposition: A | Discharge status: Home / Self Care | Payer: Medicare Other | Attending: Emergency Medicine | Admitting: Emergency Medicine

## 2018-10-05 DIAGNOSIS — N183 Chronic kidney disease, stage 3 (moderate): Secondary | ICD-10-CM | POA: Insufficient documentation

## 2018-10-05 DIAGNOSIS — J45909 Unspecified asthma, uncomplicated: Secondary | ICD-10-CM | POA: Diagnosis not present

## 2018-10-05 DIAGNOSIS — N179 Acute kidney failure, unspecified: Secondary | ICD-10-CM

## 2018-10-05 DIAGNOSIS — E1122 Type 2 diabetes mellitus with diabetic chronic kidney disease: Secondary | ICD-10-CM | POA: Diagnosis not present

## 2018-10-05 DIAGNOSIS — Z794 Long term (current) use of insulin: Secondary | ICD-10-CM | POA: Insufficient documentation

## 2018-10-05 DIAGNOSIS — N3000 Acute cystitis without hematuria: Secondary | ICD-10-CM | POA: Diagnosis not present

## 2018-10-05 DIAGNOSIS — Z79899 Other long term (current) drug therapy: Secondary | ICD-10-CM | POA: Insufficient documentation

## 2018-10-05 DIAGNOSIS — R55 Syncope and collapse: Secondary | ICD-10-CM | POA: Diagnosis not present

## 2018-10-05 LAB — URINALYSIS, ROUTINE W REFLEX MICROSCOPIC
Bilirubin Urine: NEGATIVE
Glucose, UA: NEGATIVE mg/dL
Ketones, ur: NEGATIVE mg/dL
Nitrite: NEGATIVE
Protein, ur: 30 mg/dL — AB
Specific Gravity, Urine: 1.015 (ref 1.005–1.030)
pH: 6 (ref 5.0–8.0)

## 2018-10-05 LAB — CBC WITH DIFFERENTIAL/PLATELET
Abs Immature Granulocytes: 0.29 10*3/uL — ABNORMAL HIGH (ref 0.00–0.07)
Basophils Absolute: 0.1 10*3/uL (ref 0.0–0.1)
Basophils Relative: 1 %
Eosinophils Absolute: 0.4 10*3/uL (ref 0.0–0.5)
Eosinophils Relative: 6 %
HCT: 43.2 % (ref 36.0–46.0)
Hemoglobin: 13.7 g/dL (ref 12.0–15.0)
Immature Granulocytes: 4 %
Lymphocytes Relative: 20 %
Lymphs Abs: 1.3 10*3/uL (ref 0.7–4.0)
MCH: 28.6 pg (ref 26.0–34.0)
MCHC: 31.7 g/dL (ref 30.0–36.0)
MCV: 90.2 fL (ref 80.0–100.0)
Monocytes Absolute: 0.6 10*3/uL (ref 0.1–1.0)
Monocytes Relative: 9 %
Neutro Abs: 4 10*3/uL (ref 1.7–7.7)
Neutrophils Relative %: 60 %
Platelets: 308 10*3/uL (ref 150–400)
RBC: 4.79 MIL/uL (ref 3.87–5.11)
RDW: 13.3 % (ref 11.5–15.5)
WBC: 6.6 10*3/uL (ref 4.0–10.5)
nRBC: 0 % (ref 0.0–0.2)

## 2018-10-05 LAB — COMPREHENSIVE METABOLIC PANEL
ALT: 11 U/L (ref 0–44)
AST: 17 U/L (ref 15–41)
Albumin: 4 g/dL (ref 3.5–5.0)
Alkaline Phosphatase: 78 U/L (ref 38–126)
Anion gap: 12 (ref 5–15)
BUN: 17 mg/dL (ref 8–23)
CO2: 26 mmol/L (ref 22–32)
Calcium: 8.8 mg/dL — ABNORMAL LOW (ref 8.9–10.3)
Chloride: 102 mmol/L (ref 98–111)
Creatinine, Ser: 1.43 mg/dL — ABNORMAL HIGH (ref 0.44–1.00)
GFR calc Af Amer: 44 mL/min — ABNORMAL LOW (ref >60)
GFR calc non Af Amer: 38 mL/min — ABNORMAL LOW (ref >60)
Glucose, Bld: 127 mg/dL — ABNORMAL HIGH (ref 70–99)
Potassium: 3.4 mmol/L — ABNORMAL LOW (ref 3.5–5.1)
Sodium: 140 mmol/L (ref 135–145)
Total Bilirubin: 0.8 mg/dL (ref 0.3–1.2)
Total Protein: 7.7 g/dL (ref 6.5–8.1)

## 2018-10-05 MED ORDER — CEPHALEXIN 500 MG PO CAPS: 500.0000 mg | ORAL_CAPSULE | Freq: Three times a day (TID) | ORAL | 0 refills | Status: DC

## 2018-10-05 MED ORDER — SODIUM CHLORIDE 0.9 % IV BOLUS
1000.0000 mL | Freq: Once | INTRAVENOUS | Status: AC
  Administered 2018-10-05: 1000 mL via INTRAVENOUS

## 2018-10-05 NOTE — Discharge Instructions (Addendum)
Take the abx as prescribed, follow up with your primary care doctor as instructed

## 2018-10-05 NOTE — ED Notes (Signed)
Patient ambulated to bathroom with walker. Medium Bowel movement noted.

## 2018-10-05 NOTE — ED Notes (Signed)
Pure wick has been placed. Suction set to 45mmHg.  

## 2018-10-05 NOTE — ED Provider Notes (Signed)
Patient was initially seen by Dr. Rex Kras.  Please see her note.  Patient's urinalysis is consistent with a urinary tract infection.  She has had no symptoms.  Plan on discharge home with a course of oral antibiotics   Dorie Rank, MD 10/05/18 [phone]

## 2018-10-05 NOTE — ED Provider Notes (Signed)
[facility] DEPT Provider Note   CSN: [id number] Arrival date & time: 10/05/18  1039     History   Chief Complaint Chief Complaint  Patient presents with  . Near Syncope    HPI Darlene Stafford is a 68 y.o. female.     68 year old female with past medical history including sarcoidosis on chronic steroids, type 2 diabetes mellitus, CKD, asthma, CVA who p/w near syncope. Just PTA, pt was sitting at doctor's office when she began feeling lightheaded and had near syncopal episode.  She states she recalls the entire event and did not lose consciousness.  A similar episode has happened one time previously.  She denies any preceding symptoms such as chest pain, palpitations, shortness of breath, nausea, or diaphoresis.  Currently she feels fine and denies any complaints.  She has not been sick recently.  She states that she has not had anything to eat or drink today because she was rushing around trying to get to her doctor's appointment on time. She denies any recent illness, fever, cough, or urinary symptoms.  The history is provided by the patient.  Near Syncope    Past Medical History:  Diagnosis Date  . Asthma   . CVA (cerebral vascular accident) ([place])   . Diabetes mellitus without complication ([place])   . Sarcoidosis of other sites    Ocular    Patient Active Problem List   Diagnosis Date Noted  . Pressure injury of skin 04/14/2018  . Acute bronchitis 04/30/2017  . Hemoptysis 04/30/2017  . Sarcoidosis 02/03/2017  . CKD (chronic kidney disease), stage III ([place]) 02/03/2017  . Morbid obesity due to excess calories ([place])   . CAP (community acquired pneumonia) 04/10/2015  . Diabetes mellitus without complication ([place]) [id number]  . Asthma 04/10/2015  . Acute encephalopathy 04/10/2015  . UTI (lower urinary tract infection) 04/10/2015  . AKI (acute kidney injury) ([place]) 04/10/2015  . Sepsis ([place]) 04/10/2015  . Hypotension 04/10/2015    Past Surgical  History:  Procedure Laterality Date  . PARTIAL HYSTERECTOMY  1998  . REPLACEMENT TOTAL KNEE  2015     OB History   No obstetric history on file.      Home Medications    Prior to Admission medications   Medication Sig Start Date End Date Taking? Authorizing Provider  albuterol (PROVENTIL) (2.5 MG/3ML) 0.083% nebulizer solution Take 3 mLs (2.5 mg total) by nebulization 2 (two) times daily. 10/01/18   Chesley Mires, MD  albuterol (VENTOLIN HFA) 108 (90 Base) MCG/ACT inhaler Inhale 1-2 puffs into the lungs every 4 (four) hours as needed for shortness of breath. 07/05/18   Parrett, Fonnie Mu, NP  atorvastatin (LIPITOR) 40 MG tablet Take 40 mg by mouth daily.    [provider]  carvedilol (COREG) 12.5 MG tablet Take 12.5 mg by mouth 2 (two) times daily with a meal.    [provider]  ferrous sulfate 325 (65 FE) MG tablet Take 1 tablet (325 mg total) by mouth 2 (two) times daily with a meal. 04/17/18   Regalado, Belkys A, MD  folic acid (FOLVITE) 1 MG tablet Take 1 tablet (1 mg total) by mouth daily. 04/18/18   Regalado, Belkys A, MD  guaiFENesin (MUCINEX) 600 MG 12 hr tablet Take 1 tablet (600 mg total) by mouth 2 (two) times daily. Patient taking differently: Take 600 mg by mouth as needed for to loosen phlegm.  04/14/15   Barton Dubois, MD  insulin glargine (LANTUS) 100 UNIT/ML injection Inject  0.1 mLs (10 Units total) into the skin daily. Use lantus while patient is on high dose prednisone only 04/20/18   Regalado, Belkys A, MD  levothyroxine (SYNTHROID, LEVOTHROID) 25 MCG tablet Take 1 tablet (25 mcg total) by mouth daily at 6 (six) AM. 04/18/18   Regalado, Belkys A, MD  magnesium oxide (MAG-OX) 400 (241.3 Mg) MG tablet Take 1 tablet (400 mg total) by mouth 2 (two) times daily. 04/20/18   Regalado, Belkys A, MD  montelukast (SINGULAIR) 10 MG tablet Take 1 tablet (10 mg total) by mouth at bedtime. 08/22/16   Chesley Mires, MD  omeprazole (PRILOSEC) 20 MG capsule Take 20 mg by mouth  daily. 02/25/18   [provider]  potassium chloride SA (K-DUR,KLOR-CON) 20 MEQ tablet Take 1 tablet (20 mEq total) by mouth daily for 2 days. 04/17/18 04/19/18  Regalado, Belkys A, MD  predniSONE (DELTASONE) 1 MG tablet Take 2 tablets (2 mg total) by mouth daily with breakfast for 28 days, THEN 1 tablet (1 mg total) daily with breakfast for 28 days. 10/01/18 11/26/18  Chesley Mires, MD    Family History Family History  Problem Relation Age of Onset  . Sarcoidosis Sister   . Breast cancer Sister   . Sarcoidosis Sister   . Sarcoidosis Sister     Social History Social History   Tobacco Use  . Smoking status: Never Smoker  . Smokeless tobacco: Never Used  Substance Use Topics  . Alcohol use: No  . Drug use: No     Allergies   Fish allergy, Shellfish allergy, and Sulfa antibiotics   Review of Systems Review of Systems  Cardiovascular: Positive for near-syncope.   All other systems reviewed and are negative except that which was mentioned in HPI   Physical Exam Updated Vital Signs BP 137/79 (BP Location: Right Arm)   Pulse 66   Temp (!) 97.5 F (36.4 C) (Oral)   Resp 16   Ht 5' (1.524 m)   Wt 89.8 kg   SpO2 97%   BMI 38.67 kg/m   Physical Exam Vitals signs and nursing note reviewed.  Constitutional:      General: She is not in acute distress.    Appearance: She is well-developed.  HENT:     Head: Normocephalic and atraumatic.  Eyes:     Conjunctiva/sclera: Conjunctivae normal.  Neck:     Musculoskeletal: Neck supple.  Cardiovascular:     Rate and Rhythm: Normal rate and regular rhythm.     Heart sounds: Normal heart sounds. No murmur.  Pulmonary:     Effort: Pulmonary effort is normal.     Breath sounds: Normal breath sounds.  Abdominal:     General: Bowel sounds are normal. There is no distension.     Palpations: Abdomen is soft.     Tenderness: There is no abdominal tenderness.  Skin:    General: Skin is warm and dry.  Neurological:      Mental Status: She is alert and oriented to person, place, and time.     Comments: Fluent speech  Psychiatric:        Judgment: Judgment normal.      ED Treatments / Results  Labs (all labs ordered are listed, but only abnormal results are displayed) Labs Reviewed  COMPREHENSIVE METABOLIC PANEL - Abnormal; Notable for the following components:      Result Value   Potassium 3.4 (*)    Glucose, Bld 127 (*)    Creatinine, Ser 1.43 (*)  Calcium 8.8 (*)    GFR calc non Af Amer 38 (*)    GFR calc Af Amer 44 (*)    All other components within normal limits  CBC WITH DIFFERENTIAL/PLATELET - Abnormal; Notable for the following components:   Abs Immature Granulocytes 0.29 (*)    All other components within normal limits  URINALYSIS, ROUTINE W REFLEX MICROSCOPIC    EKG EKG Interpretation  Date/Time:  Tuesday October 05 2018 11:03:37 EDT Ventricular Rate:  64 PR Interval:    QRS Duration: 136 QT Interval:  481 QTC Calculation: 497 R Axis:   -65 Text Interpretation:  Sinus rhythm RBBB and LAFB Minimal ST elevation, lateral leads since previous tracing, rate slower Confirmed by Theotis Burrow [phone]) on 10/05/2018 11:18:24 AM   Radiology No results found.  Procedures Procedures (including critical care time)  Medications Ordered in ED Medications  sodium chloride 0.9 % bolus 1,000 mL (1,000 mLs Intravenous New Bag/Given 10/05/18 1223)     Initial Impression / Assessment and Plan / ED Course  I have reviewed the triage vital signs and the nursing notes.  Pertinent labs  that were available during my care of the patient were reviewed by me and considered in my medical decision making (see chart for details).       PT comfortable on exam, reassuring VS. No complaints. Labs show normal CBC, Cr 1.4 which may be 2/2 to no PO intake today. Gave IVF Bolus. She's had no infectious symptoms and denies any sx to suggest cardiac cause.  She has been ambulatory here without problems  and blood pressure has remained stable.  I have ordered a UA because patient reported that a similar episode happened in the past in the setting of kidney infection.  Her work-up has otherwise been reassuring and I anticipate discharge home.  Signing out to the oncoming provider pending urinalysis results.  Final Clinical Impressions(s) / ED Diagnoses   Final diagnoses:  None    ED Discharge Orders    None       Sebastin Perlmutter, Wenda Overland, MD 10/05/18 1443

## 2018-10-05 NOTE — ED Triage Notes (Signed)
Per EMS, patient was at PCP and blood pressure began to drop. Patient had near syncopal episode. Patient was unresponsive to PCP staff. Patient reports this has happened before and was r/t kidney infection. Patient given 517ml NS via EMS. Patient A/O.

## 2018-10-08 LAB — URINE CULTURE: Culture: >=100000 — AB

## 2018-10-09 ENCOUNTER — Telehealth: Payer: Self-pay

## 2018-10-09 NOTE — Telephone Encounter (Signed)
Post ED Visit - Positive Culture Follow-up  Culture report reviewed by antimicrobial stewardship pharmacist: [facility] Team []  Elenor Quinones, Pharm.D. []  Heide Guile, Pharm.D., BCPS AQ-ID []  Parks Neptune, Pharm.D., BCPS []  Alycia Rossetti, Pharm.D., BCPS []  [place], [place].D., BCPS, AAHIVP []  Legrand Como, Pharm.D., BCPS, AAHIVP []  Salome Arnt, PharmD, BCPS []  Johnnette Gourd, PharmD, BCPS []  Hughes Better, PharmD, BCPS []  Leeroy Cha, PharmD []  Laqueta Linden, PharmD, BCPS []  Albertina Parr, PharmD  [place] Team [x]  Leodis Sias, PharmD []  Lindell Spar, PharmD []  Royetta Asal, PharmD []  Graylin Shiver, Rph []  Rema Fendt) Glennon Mac, PharmD []  Arlyn Dunning, PharmD []  Netta Cedars, PharmD []  Dia Sitter, PharmD []  Leone Haven, PharmD []  Gretta Arab, PharmD []  Theodis Shove, PharmD []  Peggyann Juba, PharmD []  Reuel Boom, PharmD    Positive urine culture Treated with Cephalexin, organism sensitive to the same and no further patient follow-up is required at this time.  Genia Del 10/09/2018, 7:47 AM

## 2018-11-01 ENCOUNTER — Telehealth: Payer: Self-pay | Admitting: Pulmonary Disease

## 2018-11-01 NOTE — Telephone Encounter (Signed)
Pt returning call.  [phone].

## 2018-11-01 NOTE — Telephone Encounter (Signed)
Spoke with pt, states that she's been down to 1mg  prednisone X1 month, but has had increased wheezing and nonprod cough since coming down to this dosage.  Pt notes that she felt stable at 2.5mg  daily, wants to know if she can go back to this dosage. [facility]: [facility] [address].    VS please advise on recs. Thanks!

## 2018-11-01 NOTE — Telephone Encounter (Signed)
LMTCB

## 2018-11-02 MED ORDER — PREDNISONE 5 MG PO TABS: 2.5000 mg | ORAL_TABLET | Freq: Every day | ORAL | 4 refills | Status: AC

## 2018-11-02 NOTE — Telephone Encounter (Signed)
Call returned to patient, made aware VS is okay with her staying on the prednisone 2.5mg . Voiced understanding. Pharmacy confirmed, refill sent. 4 month f/u appt made. Nothing further needed at this time.

## 2018-11-02 NOTE — Telephone Encounter (Signed)
Okay to change prednisone dose back to 2.5 mg daily.

## 2018-12-17 ENCOUNTER — Other Ambulatory Visit: Payer: Self-pay | Admitting: Pulmonary Disease

## 2018-12-17 ENCOUNTER — Telehealth: Payer: Self-pay | Admitting: Pulmonary Disease

## 2018-12-17 NOTE — Telephone Encounter (Signed)
Called and spoke with Patient.  Patient requested a refill of ALbuterol nebs to be sent to [facility].  Requested prescription sent to pharmacy.  Nothing further at this time.

## 2019-01-17 ENCOUNTER — Ambulatory Visit (INDEPENDENT_AMBULATORY_CARE_PROVIDER_SITE_OTHER): Payer: Medicare Other | Admitting: Pulmonary Disease

## 2019-01-17 ENCOUNTER — Other Ambulatory Visit: Payer: Self-pay

## 2019-01-17 ENCOUNTER — Encounter: Payer: Self-pay | Admitting: Pulmonary Disease

## 2019-01-17 DIAGNOSIS — R05 Cough: Secondary | ICD-10-CM

## 2019-01-17 DIAGNOSIS — J4550 Severe persistent asthma, uncomplicated: Secondary | ICD-10-CM

## 2019-01-17 DIAGNOSIS — R058 Other specified cough: Secondary | ICD-10-CM

## 2019-01-17 DIAGNOSIS — D869 Sarcoidosis, unspecified: Secondary | ICD-10-CM | POA: Diagnosis not present

## 2019-01-17 MED ORDER — PREDNISONE 1 MG PO TABS: ORAL_TABLET | ORAL | 0 refills | Status: DC

## 2019-01-17 MED ORDER — ARNUITY ELLIPTA 200 MCG/ACT IN AEPB: 1.0000 | INHALATION_SPRAY | Freq: Every day | RESPIRATORY_TRACT | 5 refills | Status: DC

## 2019-01-17 NOTE — Patient Instructions (Signed)
Prednisone 2 mg daily for 4 weeks, then 1 mg daily for 4 weeks, then stop prednisone  Arnuity one puff daily, and rinse mouth after each use  Singulair 10 mg pill nightly  Albuterol two puffs every 6 hours as needed for cough, wheeze, or chest congestion  Follow up in 2 months

## 2019-01-17 NOTE — Progress Notes (Signed)
Darlene Stafford, Darlene Stafford, Darlene Stafford  Chief Complaint  Patient presents with  . Follow-up    Patient is taking 2.5 prednisone. She was on 5 Darlene went down. Patient has had some wheezing.    Constitutional:  There were no vitals taken for this visit.  Deferred.  Past Medical History:  DM, CVA, CKD 3, Gout  Brief Summary:  Darlene Stafford is a 68 y.o. female with sarcoidosis on chronic prednisone, allergic asthma, Darlene upper airway cough syndrome.  Virtual Visit via Telephone Note  I connected with Darlene Stafford on 01/17/19 at  2:30 PM EST by telephone Darlene verified that I am speaking with the correct person using two identifiers.  Location: Patient: home Provider: medical office   I discussed the limitations, risks, security Darlene privacy concerns of performing an evaluation Darlene management service by telephone Darlene the availability of in person appointments. I also discussed with the patient that there may be a patient responsible charge related to this service. The patient expressed understanding Darlene agreed to proceed.  She tried going down below 2.5 mg prednisone.  Started getting more wheeze.  Has intermittent cough with clear sputum.  Not having fever, hemoptysis, chest pain, or skin rash.     Physical Exam:  Deferred.  Assessment/Plan:   Systemic sarcoidosis. - will resume plan to gradually wean off prednisone as tolerated - change to 2 mg daily for 4 weeks, then 1 mg daily for 4 weeks, then stop prednisone  Allergic asthma with elevated IgE. - will add arnuity 200 mg daily while she is weaned off prednisone - continue singulair - prn albuterol  Upper airway cough syndrome with post nasal drip. - continue singulair, cetirizine   Patient Instructions  Prednisone 2 mg daily for 4 weeks, then 1 mg daily for 4 weeks, then stop prednisone  Arnuity one puff daily, Darlene rinse mouth after each use  Singulair 10 mg pill nightly  Albuterol two puffs every 6  hours as needed for cough, wheeze, or chest congestion  Follow up in 2 months      Chesley Mires, MD [place] Stafford/Darlene Stafford Pager: [phone] 01/17/2019, 2:38 PM  Flow Sheet     [facility] tests:  RAST 09/30/16 >> multiple allergens, IgE 3120 PFT 11/10/16 >> FEV1 1.13 (73%), FEV1% 92, TLC 3.12 (71%), DLCO 76%, no BD  Chest imaging:  HRCT chest 11/03/16 >> patchy septal thickening, honeycombing, Darlene bronchiectasis CT chest 04/12/18 >> atherosclerosis, calcified mediastinal LN, honeycombing RUL, patchy LUL ASD, honeycombing posterior Lt lung, calcified granuloma in spleen, renal stones  Cardiac testing:  Echo 03/20/18 >> EF greater than 65%, normal RV systolic function  Medications:   Allergies as of 01/17/2019      Reactions   Fish Allergy Anaphylaxis   Shellfish Allergy Anaphylaxis   Sulfa Antibiotics Hives, Itching      Medication List       Accurate as of January 17, 2019  2:38 PM. If you have any questions, ask your nurse or doctor.        STOP taking these medications   cephALEXin 500 MG capsule Commonly known as: KEFLEX Stopped by: Chesley Mires, MD   folic acid 1 MG tablet Commonly known as: FOLVITE Stopped by: Chesley Mires, MD   insulin glargine 100 UNIT/ML injection Commonly known as: LANTUS Stopped by: Chesley Mires, MD     TAKE these medications   albuterol 108 (90 Base) MCG/ACT inhaler Commonly known as: VENTOLIN HFA Inhale 1-2 puffs into the lungs every  4 (four) hours as needed for shortness of breath.   albuterol (2.5 MG/3ML) 0.083% nebulizer solution Commonly known as: PROVENTIL USE 1 VIAL IN NEBULIZER TWICE DAILY   Arnuity Ellipta 200 MCG/ACT Aepb Generic drug: Fluticasone Furoate Inhale 1 puff into the lungs daily. Started by: Chesley Mires, MD   atorvastatin 40 MG tablet Commonly known as: LIPITOR Take 40 mg by mouth daily.   carvedilol 12.5 MG tablet Commonly known as: COREG Take 12.5 mg by mouth 2 (two) times daily with a  meal.   ferrous sulfate 325 (65 FE) MG tablet Take 1 tablet (325 mg total) by mouth 2 (two) times daily with a meal.   guaiFENesin 600 MG 12 hr tablet Commonly known as: MUCINEX Take 1 tablet (600 mg total) by mouth 2 (two) times daily. What changed:   when to take this  reasons to take this   levothyroxine 25 MCG tablet Commonly known as: SYNTHROID Take 1 tablet (25 mcg total) by mouth daily at 6 (six) AM.   magnesium oxide 400 (241.3 Mg) MG tablet Commonly known as: MAG-OX Take 1 tablet (400 mg total) by mouth 2 (two) times daily.   montelukast 10 MG tablet Commonly known as: SINGULAIR Take 1 tablet (10 mg total) by mouth at bedtime.   omeprazole 20 MG capsule Commonly known as: PRILOSEC Take 20 mg by mouth daily.   potassium chloride SA 20 MEQ tablet Commonly known as: KLOR-CON Take 1 tablet (20 mEq total) by mouth daily for 2 days.   predniSONE 1 MG tablet Commonly known as: DELTASONE Take 2 tablets (2 mg total) by mouth daily with breakfast for 28 days, THEN 1 tablet (1 mg total) daily with breakfast for 28 days. Start taking on: January 17, 2019 Started by: Chesley Mires, MD       Past Surgical History:  She  has a past surgical history that includes Replacement total knee (2015) Darlene Partial hysterectomy (1998).  Family History:  Her family history includes Breast cancer in her sister; Sarcoidosis in her sister, sister, Darlene sister.  Social History:  She  reports that she has never smoked. She has never used smokeless tobacco. She reports that she does not drink alcohol or use drugs.

## 2019-02-09 ENCOUNTER — Telehealth: Payer: Self-pay | Admitting: Pulmonary Disease

## 2019-02-09 NOTE — Telephone Encounter (Signed)
Spoke with patient. She was asking if our office would be giving out the COVID vaccines. Advised her that we would not be giving these vaccines. She verbalized understanding. Nothing further needed at time of call.

## 2019-03-21 ENCOUNTER — Inpatient Hospital Stay
Admission: EM | Admit: 2019-03-21 | Discharge: 2019-03-25 | DRG: 871 | Disposition: A | Discharge status: Home Health | Payer: Medicare Other | Attending: Family Medicine | Admitting: Family Medicine

## 2019-03-21 ENCOUNTER — Other Ambulatory Visit: Payer: Self-pay

## 2019-03-21 ENCOUNTER — Emergency Department: Payer: Medicare Other

## 2019-03-21 DIAGNOSIS — E860 Dehydration: Secondary | ICD-10-CM | POA: Diagnosis present

## 2019-03-21 DIAGNOSIS — Z882 Allergy status to sulfonamides status: Secondary | ICD-10-CM

## 2019-03-21 DIAGNOSIS — E1165 Type 2 diabetes mellitus with hyperglycemia: Secondary | ICD-10-CM | POA: Diagnosis present

## 2019-03-21 DIAGNOSIS — G9341 Metabolic encephalopathy: Secondary | ICD-10-CM | POA: Diagnosis present

## 2019-03-21 DIAGNOSIS — R531 Weakness: Secondary | ICD-10-CM | POA: Diagnosis not present

## 2019-03-21 DIAGNOSIS — D8689 Sarcoidosis of other sites: Secondary | ICD-10-CM | POA: Diagnosis present

## 2019-03-21 DIAGNOSIS — N179 Acute kidney failure, unspecified: Secondary | ICD-10-CM

## 2019-03-21 DIAGNOSIS — E876 Hypokalemia: Secondary | ICD-10-CM | POA: Diagnosis not present

## 2019-03-21 DIAGNOSIS — Z713 Dietary counseling and surveillance: Secondary | ICD-10-CM

## 2019-03-21 DIAGNOSIS — Z7952 Long term (current) use of systemic steroids: Secondary | ICD-10-CM

## 2019-03-21 DIAGNOSIS — Z90710 Acquired absence of both cervix and uterus: Secondary | ICD-10-CM

## 2019-03-21 DIAGNOSIS — R197 Diarrhea, unspecified: Secondary | ICD-10-CM

## 2019-03-21 DIAGNOSIS — Z79890 Hormone replacement therapy (postmenopausal): Secondary | ICD-10-CM

## 2019-03-21 DIAGNOSIS — Z7951 Long term (current) use of inhaled steroids: Secondary | ICD-10-CM

## 2019-03-21 DIAGNOSIS — T380X5A Adverse effect of glucocorticoids and synthetic analogues, initial encounter: Secondary | ICD-10-CM | POA: Diagnosis present

## 2019-03-21 DIAGNOSIS — Z6837 Body mass index (BMI) 37.0-37.9, adult: Secondary | ICD-10-CM

## 2019-03-21 DIAGNOSIS — N183 Chronic kidney disease, stage 3 unspecified: Secondary | ICD-10-CM | POA: Diagnosis present

## 2019-03-21 DIAGNOSIS — J45901 Unspecified asthma with (acute) exacerbation: Secondary | ICD-10-CM | POA: Diagnosis present

## 2019-03-21 DIAGNOSIS — Z20822 Contact with and (suspected) exposure to covid-19: Secondary | ICD-10-CM | POA: Diagnosis present

## 2019-03-21 DIAGNOSIS — E119 Type 2 diabetes mellitus without complications: Secondary | ICD-10-CM

## 2019-03-21 DIAGNOSIS — A419 Sepsis, unspecified organism: Secondary | ICD-10-CM | POA: Diagnosis present

## 2019-03-21 DIAGNOSIS — D869 Sarcoidosis, unspecified: Secondary | ICD-10-CM | POA: Diagnosis present

## 2019-03-21 DIAGNOSIS — Z79899 Other long term (current) drug therapy: Secondary | ICD-10-CM

## 2019-03-21 DIAGNOSIS — A414 Sepsis due to anaerobes: Principal | ICD-10-CM | POA: Diagnosis present

## 2019-03-21 DIAGNOSIS — E039 Hypothyroidism, unspecified: Secondary | ICD-10-CM

## 2019-03-21 DIAGNOSIS — I10 Essential (primary) hypertension: Secondary | ICD-10-CM | POA: Diagnosis present

## 2019-03-21 DIAGNOSIS — Z8673 Personal history of transient ischemic attack (TIA), and cerebral infarction without residual deficits: Secondary | ICD-10-CM

## 2019-03-21 DIAGNOSIS — K219 Gastro-esophageal reflux disease without esophagitis: Secondary | ICD-10-CM

## 2019-03-21 DIAGNOSIS — R451 Restlessness and agitation: Secondary | ICD-10-CM | POA: Diagnosis not present

## 2019-03-21 DIAGNOSIS — Z91013 Allergy to seafood: Secondary | ICD-10-CM

## 2019-03-21 DIAGNOSIS — A498 Other bacterial infections of unspecified site: Secondary | ICD-10-CM | POA: Diagnosis present

## 2019-03-21 DIAGNOSIS — A0472 Enterocolitis due to Clostridium difficile, not specified as recurrent: Secondary | ICD-10-CM | POA: Diagnosis present

## 2019-03-21 LAB — CBC
HCT: 38.6 % (ref 36.0–46.0)
Hemoglobin: 13.2 g/dL (ref 12.0–15.0)
MCH: 29.5 pg (ref 26.0–34.0)
MCHC: 34.2 g/dL (ref 30.0–36.0)
MCV: 86.2 fL (ref 80.0–100.0)
Platelets: 612 10*3/uL — ABNORMAL HIGH (ref 150–400)
RBC: 4.48 MIL/uL (ref 3.87–5.11)
RDW: 12.9 % (ref 11.5–15.5)
WBC: 9.3 10*3/uL (ref 4.0–10.5)
nRBC: 0 % (ref 0.0–0.2)

## 2019-03-21 LAB — HEPATIC FUNCTION PANEL
ALT: 7 U/L (ref 0–44)
AST: 26 U/L (ref 15–41)
Albumin: 3.4 g/dL — ABNORMAL LOW (ref 3.5–5.0)
Alkaline Phosphatase: 49 U/L (ref 38–126)
Bilirubin, Direct: 0.7 mg/dL — ABNORMAL HIGH (ref 0.0–0.2)
Indirect Bilirubin: 1.6 mg/dL — ABNORMAL HIGH (ref 0.3–0.9)
Total Bilirubin: 2.3 mg/dL — ABNORMAL HIGH (ref 0.3–1.2)
Total Protein: 7.3 g/dL (ref 6.5–8.1)

## 2019-03-21 LAB — BASIC METABOLIC PANEL
Anion gap: 15 (ref 5–15)
BUN: 8 mg/dL (ref 8–23)
CO2: 30 mmol/L (ref 22–32)
Calcium: 9.1 mg/dL (ref 8.9–10.3)
Chloride: 90 mmol/L — ABNORMAL LOW (ref 98–111)
Creatinine, Ser: 1.47 mg/dL — ABNORMAL HIGH (ref 0.44–1.00)
GFR calc Af Amer: 42 mL/min — ABNORMAL LOW (ref >60)
GFR calc non Af Amer: 36 mL/min — ABNORMAL LOW (ref >60)
Glucose, Bld: 129 mg/dL — ABNORMAL HIGH (ref 70–99)
Potassium: 2.9 mmol/L — ABNORMAL LOW (ref 3.5–5.1)
Sodium: 135 mmol/L (ref 135–145)

## 2019-03-21 LAB — TROPONIN I (HIGH SENSITIVITY)
Troponin I (High Sensitivity): 32 ng/L — ABNORMAL HIGH (ref <18)
Troponin I (High Sensitivity): 27 ng/L — ABNORMAL HIGH (ref <18)

## 2019-03-21 LAB — RESPIRATORY PANEL BY RT PCR (FLU A&B, COVID)
Influenza A by PCR: NEGATIVE
Influenza B by PCR: NEGATIVE
SARS Coronavirus 2 by RT PCR: NEGATIVE

## 2019-03-21 LAB — MAGNESIUM: Magnesium: 0.9 mg/dL — CL (ref 1.7–2.4)

## 2019-03-21 MED ORDER — IPRATROPIUM-ALBUTEROL 0.5-2.5 (3) MG/3ML IN SOLN
3.0000 mL | Freq: Once | RESPIRATORY_TRACT | Status: AC
  Administered 2019-03-21: 21:00:00 3 mL via RESPIRATORY_TRACT
  Filled 2019-03-21: qty 3

## 2019-03-21 MED ORDER — SODIUM CHLORIDE 0.9% FLUSH: 3.0000 mL | Freq: Once | INTRAVENOUS | Status: DC

## 2019-03-21 MED ORDER — METHYLPREDNISOLONE SODIUM SUCC 125 MG IJ SOLR
125.0000 mg | Freq: Once | INTRAMUSCULAR | Status: AC
  Administered 2019-03-21: 125 mg via INTRAVENOUS
  Filled 2019-03-21: qty 2

## 2019-03-21 MED ORDER — POTASSIUM CHLORIDE CRYS ER 20 MEQ PO TBCR
40.0000 meq | EXTENDED_RELEASE_TABLET | Freq: Once | ORAL | Status: AC
  Administered 2019-03-21: 20:00:00 40 meq via ORAL
  Filled 2019-03-21: qty 2

## 2019-03-21 MED ORDER — SODIUM CHLORIDE 0.9 % IV BOLUS
1000.0000 mL | Freq: Once | INTRAVENOUS | Status: AC
  Administered 2019-03-21: 20:00:00 1000 mL via INTRAVENOUS

## 2019-03-21 MED ORDER — POTASSIUM CHLORIDE 10 MEQ/100ML IV SOLN
10.0000 meq | INTRAVENOUS | Status: AC
  Administered 2019-03-21 (×2): 10 meq via INTRAVENOUS
  Filled 2019-03-21 (×2): qty 100

## 2019-03-21 MED ORDER — MAGNESIUM SULFATE 2 GM/50ML IV SOLN
2.0000 g | Freq: Once | INTRAVENOUS | Status: AC
  Administered 2019-03-21: 21:00:00 2 g via INTRAVENOUS
  Filled 2019-03-21: qty 50

## 2019-03-21 MED ORDER — IPRATROPIUM-ALBUTEROL 0.5-2.5 (3) MG/3ML IN SOLN
3.0000 mL | Freq: Once | RESPIRATORY_TRACT | Status: AC
  Administered 2019-03-21: 3 mL via RESPIRATORY_TRACT
  Filled 2019-03-21: qty 3

## 2019-03-21 NOTE — ED Notes (Signed)
Upon entering room, pt is resting in bed. Pt is easily arousable, alert and oriented. Respirations are equal and unlabored at this time. SpO2 is 98% on 2L of oxygen and denies any discomfort while breathing at this time. No signs of acute distress.

## 2019-03-21 NOTE — ED Notes (Signed)
Pt 2nd dose of potassium is running at 50 mL/hr instead of the ordered 100 mL/hr due to burning in pt arm. MD Jari Pigg made aware. Site around IV is normal and no signs of redness or swelling.

## 2019-03-21 NOTE — ED Notes (Signed)
Pt SpO2 dropped down to 85% on RA, pt placed back on 2L of O2 SpO2 is now 98%

## 2019-03-21 NOTE — H&P (Signed)
[facility] NAME: Darlene Stafford    MR#:  [id number]  DATE OF BIRTH:  05/18/1950  DATE OF ADMISSION:  03/21/2019  PRIMARY CARE PHYSICIAN: Katherina Mires, MD   REQUESTING/REFERRING PHYSICIAN: Marjean Donna, MD  CHIEF COMPLAINT:   Chief Complaint  Patient presents with  . Weakness    HISTORY OF PRESENT ILLNESS:  Darlene Stafford  is a 69 y.o. female with a known history of asthma, type diabetes mellitus and sarcoidosis as well as CVA, who presented to the emergency room with an onset of worsening dyspnea with associated cough productive of clear sputum as well as wheezing over the last couple weeks with significant deterioration since yesterday.  She denied any fever or chills.  No chest pain or palpitations.  She admitted to diarrhea with watery bowel movements and generalized weakness.  Upon presentation to the emergency room, respiratory it was 34 initially and later 22 with otherwise normal vital signs.  Labs revealed hypokalemia with potassium of 2.9 and a creatinine of 1.47 close to previous level in September 2020.  High-sensitivity troponin I was 32 and CBC was remarkable for thrombocytosis.  Influenza antigens and COVID-19 PCR came back negative.  Stable scarring and fibrosis with no acute cardiopulmonary disease.  The patient was given nebulized DuoNebs x3 2 g of IV magnesium sulfate, 125 mg IV Solu-Medrol and 10 mEq IV potassium chloride as well as 40 mEq p.o. with 1 L bolus of IV normal saline.  She will be admitted to a medically monitored observation bed for further evaluation and management.  PAST MEDICAL HISTORY:   Past Medical History:  Diagnosis Date  . Asthma   . CVA (cerebral vascular accident) ([place])   . Diabetes mellitus without complication ([place])   . Sarcoidosis of other sites    Ocular    PAST SURGICAL HISTORY:   Past Surgical History:  Procedure Laterality Date  . PARTIAL HYSTERECTOMY  1998  . REPLACEMENT TOTAL KNEE  2015     SOCIAL HISTORY:   Social History   Tobacco Use  . Smoking status: Never Smoker  . Smokeless tobacco: Never Used  Substance Use Topics  . Alcohol use: No    FAMILY HISTORY:   Family History  Problem Relation Age of Onset  . Sarcoidosis Sister   . Breast cancer Sister   . Sarcoidosis Sister   . Sarcoidosis Sister     DRUG ALLERGIES:   Allergies  Allergen Reactions  . Fish Allergy Anaphylaxis  . Shellfish Allergy Anaphylaxis  . Sulfa Antibiotics Hives and Itching    REVIEW OF SYSTEMS:   ROS As per history of present illness. All pertinent systems were reviewed above. Constitutional,  HEENT, cardiovascular, respiratory, GI, GU, musculoskeletal, neuro, psychiatric, endocrine,  integumentary and hematologic systems were reviewed and are otherwise  negative/unremarkable except for positive findings mentioned above in the HPI.   MEDICATIONS AT HOME:   Prior to Admission medications   Medication Sig Start Date End Date Taking? Authorizing Provider  amLODipine (NORVASC) 10 MG tablet Take 10 mg by mouth daily. 12/20/18  Yes [provider]  atorvastatin (LIPITOR) 40 MG tablet Take 40 mg by mouth daily.   Yes [provider]  carvedilol (COREG) 12.5 MG tablet Take 12.5 mg by mouth 2 (two) times daily with a meal.   Yes [provider]  cetirizine (ZYRTEC) 10 MG tablet Take 10 mg by mouth daily as needed. 12/17/18  Yes [provider]  ferrous  sulfate 325 (65 FE) MG tablet Take 1 tablet (325 mg total) by mouth 2 (two) times daily with a meal. 04/17/18  Yes Regalado, Belkys A, MD  fluticasone (FLONASE) 50 MCG/ACT nasal spray Place 2 sprays into both nostrils daily. 12/20/18  Yes [provider]  Fluticasone Furoate (ARNUITY ELLIPTA) 200 MCG/ACT AEPB Inhale 1 puff into the lungs daily. 01/17/19  Yes Chesley Mires, MD  levothyroxine (SYNTHROID, LEVOTHROID) 25 MCG tablet Take 1 tablet (25 mcg total) by mouth daily at 6 (six) AM.  04/18/18  Yes Regalado, Belkys A, MD  magnesium oxide (MAG-OX) 400 (241.3 Mg) MG tablet Take 1 tablet (400 mg total) by mouth 2 (two) times daily. 04/20/18  Yes Regalado, Belkys A, MD  montelukast (SINGULAIR) 10 MG tablet Take 1 tablet (10 mg total) by mouth at bedtime. 08/22/16  Yes Chesley Mires, MD  omeprazole (PRILOSEC) 20 MG capsule Take 20 mg by mouth daily. 02/25/18  Yes [provider]  predniSONE (DELTASONE) 5 MG tablet Take 2.5 mg by mouth daily at 6 (six) AM. 12/17/18  Yes [provider]  albuterol (PROVENTIL) (2.5 MG/3ML) 0.083% nebulizer solution USE 1 VIAL IN NEBULIZER TWICE DAILY 12/17/18   Chesley Mires, MD  albuterol (VENTOLIN HFA) 108 (90 Base) MCG/ACT inhaler Inhale 1-2 puffs into the lungs every 4 (four) hours as needed for shortness of breath. 07/05/18   Parrett, Fonnie Mu, NP  guaiFENesin (MUCINEX) 600 MG 12 hr tablet Take 1 tablet (600 mg total) by mouth 2 (two) times daily. Patient taking differently: Take 600 mg by mouth as needed for to loosen phlegm.  04/14/15   Barton Dubois, MD  potassium chloride SA (K-DUR,KLOR-CON) 20 MEQ tablet Take 1 tablet (20 mEq total) by mouth daily for 2 days. 04/17/18 04/19/18  Regalado, Belkys A, MD      VITAL SIGNS:  Blood pressure 125/63, pulse 85, temperature 97.8 F (36.6 C), temperature source Oral, resp. rate 20, height 5\' 1"  (1.549 m), weight 90.7 kg, SpO2 96 %.  PHYSICAL EXAMINATION:  Physical Exam  GENERAL:  69 y.o.-year-old patient lying in the bed with mild respiratory distress with conversational dyspnea. EYES: Pupils equal, round, reactive to light and accommodation. No scleral icterus. Extraocular muscles intact.  HEENT: Head atraumatic, normocephalic. Oropharynx and nasopharynx clear.  NECK:  Supple, no jugular venous distention. No thyroid enlargement, no tenderness.  LUNGS: Diffuse expiratory wheezes with slightly diminished expiratory airflow and harsh vesicular breathing CARDIOVASCULAR: Regular rate and  rhythm, S1, S2 normal. No murmurs, rubs, or gallops.  ABDOMEN: Soft, nondistended, nontender. Bowel sounds present. No organomegaly or mass.  EXTREMITIES: No pedal edema, cyanosis, or clubbing.  NEUROLOGIC: Cranial nerves II through XII are intact. Muscle strength 5/5 in all extremities. Sensation intact. Gait not checked.  PSYCHIATRIC: The patient is alert and oriented x 3.  Normal affect and good eye contact. SKIN: No obvious rash, lesion, or ulcer.   LABORATORY PANEL:   CBC Recent Labs  Lab 03/21/19 1827  WBC 9.3  HGB 13.2  HCT 38.6  PLT 612*   ------------------------------------------------------------------------------------------------------------------  Chemistries  Recent Labs  Lab 03/21/19 1827 03/21/19 1959  NA 135  --   K 2.9*  --   CL 90*  --   CO2 30  --   GLUCOSE 129*  --   BUN 8  --   CREATININE 1.47*  --   CALCIUM 9.1  --   MG 0.9*  --   AST  --  26  ALT  --  7  ALKPHOS  --  49  BILITOT  --  2.3*   ------------------------------------------------------------------------------------------------------------------  Cardiac Enzymes No results for input(s): TROPONINI in the last 168 hours. ------------------------------------------------------------------------------------------------------------------  RADIOLOGY:  DG Chest Portable 1 View  Result Date: 03/21/2019 CLINICAL DATA:  Weakness, nausea, vomiting, diarrhea, febrile, history of sarcoidosis EXAM: PORTABLE CHEST 1 VIEW COMPARISON:  04/15/2018 FINDINGS: Single frontal view of the chest demonstrates an unremarkable cardiac silhouette. Chronic scarring and fibrosis of the right chest unchanged. No new airspace disease, effusion, or pneumothorax. No acute bony abnormalities. IMPRESSION: 1. Stable scarring and fibrosis.  No acute intrathoracic process. Electronically Signed   By: Randa Ngo M.D.   On: 03/21/2019 20:17      IMPRESSION AND PLAN:   1.  Acute asthma exacerbation. The patient will  be admitted to a medically monitored bed and will be placed on IV steroid therapy with IV Solu-Medrol as well as nebulized bronchodilator therapy with duonebs q.i.d. and q.4 hours p.r.n., mucolytic therapy with Mucinex. O2 protocol will be followed.  2.  Hypokalemia likely contributing to generalized weakness.  Potassium will be replaced and hypomagnesemia of 0.9 will be replaced.  3.  Diarrhea.  C. difficile antigen PCR came back positive and toxin was negative.  The patient will be placed on p.o. Flagyl.  4.  Hypertension.  We will continue his amlodipine and Coreg.  5.  Hypothyroidism.  We will continue Synthroid and check TSH.  6.  DVT prophylaxis.  Subcutaneous Lovenox.     All the records are reviewed and case discussed with ED provider. The plan of care was discussed in details with the patient (and family). I answered all questions. The patient agreed to proceed with the above mentioned plan. Further management will depend upon hospital course.   CODE STATUS: Full code  TOTAL TIME TAKING CARE OF THIS PATIENT: 55 minutes.    Christel Mormon M.D on 03/21/2019 at 11:55 PM  [facility]   From 7 PM-7 AM, contact night-coverage [email or web address]  CC: Primary care physician; Katherina Mires, MD   Note: This dictation was prepared with Dragon dictation along with smaller phrase technology. Any transcriptional errors that result from this process are unintentional.

## 2019-03-21 NOTE — ED Provider Notes (Signed)
[facility] Emergency Department Provider Note  ____________________________________________   First MD Initiated Contact with Patient 03/21/19 1856     (approximate)  I have reviewed the triage vital signs and the nursing notes.   HISTORY  Chief Complaint Weakness    HPI Darlene Stafford is a 69 y.o. female with diabetes, CVA, sarcoidosiswho comes in with increased weakness.  Patient is had nausea, vomiting, diarrhea for the past 10 days.  On review of systems patient was seen on 2/2 by telemedicine and prescribed Imodium.  Patient does not seem to be a great historian but states that she is also coming in because she has been having worsening cough and wheezing over the past few days that is constant, nothing makes better, nothing makes it worse.  She has a history of sarcoidosis and asthma and this feels similar.  Denies a history of blood clots.  States that she really has not had too much worsening shortness of breath or chest pain.  No abdominal pain.          Past Medical History:  Diagnosis Date  . Asthma   . CVA (cerebral vascular accident) ([place])   . Diabetes mellitus without complication ([place])   . Sarcoidosis of other sites    Ocular    Patient Active Problem List   Diagnosis Date Noted  . Pressure injury of skin 04/14/2018  . Acute bronchitis 04/30/2017  . Hemoptysis 04/30/2017  . Sarcoidosis 02/03/2017  . CKD (chronic kidney disease), stage III 02/03/2017  . Morbid obesity due to excess calories ([place])   . CAP (community acquired pneumonia) 04/10/2015  . Diabetes mellitus without complication ([place]) [id number]  . Asthma 04/10/2015  . Acute encephalopathy 04/10/2015  . UTI (lower urinary tract infection) 04/10/2015  . AKI (acute kidney injury) ([place]) 04/10/2015  . Sepsis ([place]) 04/10/2015  . Hypotension 04/10/2015    Past Surgical History:  Procedure Laterality Date  . PARTIAL HYSTERECTOMY  1998  . REPLACEMENT TOTAL KNEE  2015     Prior to Admission medications   Medication Sig Start Date End Date Taking? Authorizing Provider  albuterol (PROVENTIL) (2.5 MG/3ML) 0.083% nebulizer solution USE 1 VIAL IN NEBULIZER TWICE DAILY 12/17/18   Chesley Mires, MD  albuterol (VENTOLIN HFA) 108 (90 Base) MCG/ACT inhaler Inhale 1-2 puffs into the lungs every 4 (four) hours as needed for shortness of breath. 07/05/18   Parrett, Fonnie Mu, NP  atorvastatin (LIPITOR) 40 MG tablet Take 40 mg by mouth daily.    [provider]  carvedilol (COREG) 12.5 MG tablet Take 12.5 mg by mouth 2 (two) times daily with a meal.    [provider]  ferrous sulfate 325 (65 FE) MG tablet Take 1 tablet (325 mg total) by mouth 2 (two) times daily with a meal. 04/17/18   Regalado, Belkys A, MD  Fluticasone Furoate (ARNUITY ELLIPTA) 200 MCG/ACT AEPB Inhale 1 puff into the lungs daily. 01/17/19   Chesley Mires, MD  guaiFENesin (MUCINEX) 600 MG 12 hr tablet Take 1 tablet (600 mg total) by mouth 2 (two) times daily. Patient taking differently: Take 600 mg by mouth as needed for to loosen phlegm.  04/14/15   Barton Dubois, MD  levothyroxine (SYNTHROID, LEVOTHROID) 25 MCG tablet Take 1 tablet (25 mcg total) by mouth daily at 6 (six) AM. 04/18/18   Regalado, Belkys A, MD  magnesium oxide (MAG-OX) 400 (241.3 Mg) MG tablet Take 1 tablet (400 mg total) by mouth 2 (two) times daily. 04/20/18  Regalado, Belkys A, MD  montelukast (SINGULAIR) 10 MG tablet Take 1 tablet (10 mg total) by mouth at bedtime. 08/22/16   Chesley Mires, MD  omeprazole (PRILOSEC) 20 MG capsule Take 20 mg by mouth daily. 02/25/18   [provider]  potassium chloride SA (K-DUR,KLOR-CON) 20 MEQ tablet Take 1 tablet (20 mEq total) by mouth daily for 2 days. 04/17/18 04/19/18  Regalado, Jerald Kief A, MD    Allergies Fish allergy, Shellfish allergy, and Sulfa antibiotics  Family History  Problem Relation Age of Onset  . Sarcoidosis Sister   . Breast cancer Sister   . Sarcoidosis Sister    . Sarcoidosis Sister     Social History Social History   Tobacco Use  . Smoking status: Never Smoker  . Smokeless tobacco: Never Used  Substance Use Topics  . Alcohol use: No  . Drug use: No      Review of Systems Constitutional: No fever/chills, positive weakness Eyes: No visual changes. ENT: No sore throat. Cardiovascular: Denies chest pain. Respiratory: Positive cough, shortness of breath Gastrointestinal: No abdominal pain.  positive vomiting and diarrhea Genitourinary: Negative for dysuria. Musculoskeletal: Negative for back pain. Skin: Negative for rash. Neurological: Negative for headaches, focal weakness or numbness. All other ROS negative ____________________________________________   PHYSICAL EXAM:  VITAL SIGNS: ED Triage Vitals  Enc Vitals Group     BP 03/21/19 1817 (!) 80/48     Pulse Rate 03/21/19 1817 (!) 108     Resp 03/21/19 1817 20     Temp 03/21/19 1817 97.8 F (36.6 C)     Temp Source 03/21/19 1817 Oral     SpO2 03/21/19 1817 92 %     Weight 03/21/19 1809 200 lb (90.7 kg)     Height 03/21/19 1809 5\' 1"  (1.549 m)     Head Circumference --      Peak Flow --      Pain Score 03/21/19 1807 7     Pain Loc --      Pain Edu? --      Excl. in [place]? --     Constitutional: Alert and oriented. Well appearing and in no acute distress. Eyes: Conjunctivae are normal. EOMI. Head: Atraumatic. Nose: No congestion/rhinnorhea. Mouth/Throat: Mucous membranes are dry Neck: No stridor. Trachea Midline. FROM Cardiovascular: Tachycardic, regular rhythm. Grossly normal heart sounds.  Good peripheral circulation. Respiratory: Normal respiratory effort.  No retractions.  Coarse breath sounds with some mild wheezing Gastrointestinal: Soft and nontender. No distention. No abdominal bruits.  Musculoskeletal: No lower extremity tenderness nor edema.  No joint effusions. Neurologic:  Normal speech and language. No gross focal neurologic deficits are appreciated.   Skin:  Skin is warm, dry and intact. No rash noted. Psychiatric: Mood and affect are normal. Speech and behavior are normal. GU: Deferred   ____________________________________________   LABS (all labs ordered are listed, but only abnormal results are displayed)  Labs Reviewed  BASIC METABOLIC PANEL - Abnormal; Notable for the following components:      Result Value   Potassium 2.9 (*)    Chloride 90 (*)    Glucose, Bld 129 (*)    Creatinine, Ser 1.47 (*)    GFR calc non Af Amer 36 (*)    GFR calc Af Amer 42 (*)    All other components within normal limits  CBC - Abnormal; Notable for the following components:   Platelets 612 (*)    All other components within normal limits  MAGNESIUM - Abnormal; Notable for  the following components:   Magnesium 0.9 (*)    All other components within normal limits  HEPATIC FUNCTION PANEL - Abnormal; Notable for the following components:   Albumin 3.4 (*)    Total Bilirubin 2.3 (*)    Bilirubin, Direct 0.7 (*)    Indirect Bilirubin 1.6 (*)    All other components within normal limits  TROPONIN I (HIGH SENSITIVITY) - Abnormal; Notable for the following components:   Troponin I (High Sensitivity) 32 (*)    All other components within normal limits  TROPONIN I (HIGH SENSITIVITY) - Abnormal; Notable for the following components:   Troponin I (High Sensitivity) 27 (*)    All other components within normal limits  RESPIRATORY PANEL BY RT PCR (FLU A&B, COVID)  GI PATHOGEN PANEL BY PCR, STOOL  C DIFFICILE QUICK SCREEN W PCR REFLEX  URINALYSIS, COMPLETE (UACMP) WITH MICROSCOPIC  CBG MONITORING, ED   ____________________________________________   ED ECG REPORT I, Vanessa Dodge, the attending physician, personally viewed and interpreted this ECG.  EKG is normal sinus rate of 90, no ST elevations, T wave inversions in V1 through V4, right bundle branch block.  Unchanged from prior ____________________________________________  RADIOLOGY Robert Bellow, personally viewed and evaluated these images (plain radiographs) as part of my medical decision making, as well as reviewing the written report by the radiologist.  ED MD interpretation: No pneumonia noted  Official radiology report(s): DG Chest Portable 1 View  Result Date: 03/21/2019 CLINICAL DATA:  Weakness, nausea, vomiting, diarrhea, febrile, history of sarcoidosis EXAM: PORTABLE CHEST 1 VIEW COMPARISON:  04/15/2018 FINDINGS: Single frontal view of the chest demonstrates an unremarkable cardiac silhouette. Chronic scarring and fibrosis of the right chest unchanged. No new airspace disease, effusion, or pneumothorax. No acute bony abnormalities. IMPRESSION: 1. Stable scarring and fibrosis.  No acute intrathoracic process. Electronically Signed   By: Randa Ngo M.D.   On: 03/21/2019 20:17    ____________________________________________   PROCEDURES  Procedure(s) performed (including Critical Care):  Procedures   ____________________________________________   INITIAL IMPRESSION / ASSESSMENT AND PLAN / ED COURSE  Danaja Lienhard was evaluated in Emergency Department on 03/21/2019 for the symptoms described in the history of present illness. She was evaluated in the context of the global COVID-19 pandemic, which necessitated consideration that the patient might be at risk for infection with the SARS-CoV-2 virus that causes COVID-19. Institutional protocols and algorithms that pertain to the evaluation of patients at risk for COVID-19 are in a state of rapid change based on information released by regulatory bodies including the CDC and federal and state organizations. These policies and algorithms were followed during the patient's care in the ED.    Patient is a 69 year old who comes in with cough, wheezing, vomiting and diarrhea.  Patient is tachycardic and hypotensive most likely secondary to dehydration.  Will get labs evaluate for electrolyte abnormalities, AKI.  Will  replete accordingly.  Will get UA to evaluate for UTI.  No abdominal tenderness to suggest abdominal pathology.  Will get chest x-ray given cough.  Lower suspicion for PE given no prior history of this and she states that this feels similar to prior asthma and sarcoidosis flares.  Will get Covid testing.  Patient's creatinine is 1.47 up from her baseline of normal.  Her potassium was also significantly low at 2.9 and her chloride is low at 90  Magnesium is 0.9.  We will give some IV repletion.  Attempt to get patient out of bed to  ambulate her and patient states that she was too weak to be able to do that.   Discussed with patient's daughter who stated that patient has not been able to get out of bed for the past 2 days due to weakness.  Will discuss to the hospital team for admission due to the above electrolyte abnormalities.   Cardiac markers are elevated but stable.  Most likely from her elevated creatinine.  Bilirubin was slightly elevated but she had no abdominal tenderness.  Discussed with Dr. Sidney Ace who wanted to wait until the Covid test was back.  If negative will accept patient here.  If positive will send to [facility]   ____________________________________________   FINAL CLINICAL IMPRESSION(S) / ED DIAGNOSES   Final diagnoses:  Weakness  Dehydration  AKI (acute kidney injury) ([place])  Hypomagnesemia  Hypokalemia      MEDICATIONS GIVEN DURING THIS VISIT:  Medications  potassium chloride 10 mEq in 100 mL IVPB (10 mEq Intravenous New Bag/Given 03/21/19 2227)  sodium chloride 0.9 % bolus 1,000 mL (0 mLs Intravenous Stopped 03/21/19 2226)  ipratropium-albuterol (DUONEB) 0.5-2.5 (3) MG/3ML nebulizer solution 3 mL (3 mLs Nebulization Given 03/21/19 2030)  ipratropium-albuterol (DUONEB) 0.5-2.5 (3) MG/3ML nebulizer solution 3 mL (3 mLs Nebulization Given 03/21/19 2030)  ipratropium-albuterol (DUONEB) 0.5-2.5 (3) MG/3ML nebulizer solution 3 mL (3 mLs Nebulization Given  03/21/19 2030)  methylPREDNISolone sodium succinate (SOLU-MEDROL) 125 mg/2 mL injection 125 mg (125 mg Intravenous Given 03/21/19 2021)  potassium chloride SA (KLOR-CON) CR tablet 40 mEq (40 mEq Oral Given 03/21/19 2025)  magnesium sulfate IVPB 2 g 50 mL (0 g Intravenous Stopped 03/21/19 2218)     ED Discharge Orders    None       Note:  This document was prepared using Dragon voice recognition software and may include unintentional dictation errors.   Vanessa [place], MD 03/21/19 2255

## 2019-03-21 NOTE — ED Notes (Signed)
Pt reports that she feels too tired and SOB to ambulate around room. MD Jari Pigg Notified.

## 2019-03-21 NOTE — ED Notes (Signed)
Pt SpO2 is 100% on 3L of oxygen, this RN turned O2 down to 2L and will monitor how pt tolerates it.

## 2019-03-21 NOTE — ED Triage Notes (Addendum)
Pt comes into the ED via EMS from home with c/o increased weakness and concerned that when this happens she has kidney function issues, N/V/D 100.1 temp, 99RA, 121/72, rr15  pt reports N/V/D for the past week to 10 days.

## 2019-03-21 NOTE — ED Notes (Signed)
Pt taken off of all oxygen per MD Jari Pigg order. This RN will monitor to see how pt tolerates room air.

## 2019-03-22 ENCOUNTER — Other Ambulatory Visit: Payer: Self-pay

## 2019-03-22 ENCOUNTER — Encounter: Payer: Self-pay | Admitting: Internal Medicine

## 2019-03-22 DIAGNOSIS — Z8673 Personal history of transient ischemic attack (TIA), and cerebral infarction without residual deficits: Secondary | ICD-10-CM | POA: Diagnosis not present

## 2019-03-22 DIAGNOSIS — A414 Sepsis due to anaerobes: Secondary | ICD-10-CM | POA: Diagnosis present

## 2019-03-22 DIAGNOSIS — E860 Dehydration: Secondary | ICD-10-CM | POA: Diagnosis present

## 2019-03-22 DIAGNOSIS — Z79890 Hormone replacement therapy (postmenopausal): Secondary | ICD-10-CM | POA: Diagnosis not present

## 2019-03-22 DIAGNOSIS — A419 Sepsis, unspecified organism: Secondary | ICD-10-CM | POA: Diagnosis not present

## 2019-03-22 DIAGNOSIS — N179 Acute kidney failure, unspecified: Secondary | ICD-10-CM | POA: Diagnosis present

## 2019-03-22 DIAGNOSIS — Z90710 Acquired absence of both cervix and uterus: Secondary | ICD-10-CM | POA: Diagnosis not present

## 2019-03-22 DIAGNOSIS — K219 Gastro-esophageal reflux disease without esophagitis: Secondary | ICD-10-CM

## 2019-03-22 DIAGNOSIS — R451 Restlessness and agitation: Secondary | ICD-10-CM | POA: Diagnosis not present

## 2019-03-22 DIAGNOSIS — E039 Hypothyroidism, unspecified: Secondary | ICD-10-CM | POA: Diagnosis present

## 2019-03-22 DIAGNOSIS — Z7952 Long term (current) use of systemic steroids: Secondary | ICD-10-CM | POA: Diagnosis not present

## 2019-03-22 DIAGNOSIS — A498 Other bacterial infections of unspecified site: Secondary | ICD-10-CM | POA: Diagnosis present

## 2019-03-22 DIAGNOSIS — Z882 Allergy status to sulfonamides status: Secondary | ICD-10-CM | POA: Diagnosis not present

## 2019-03-22 DIAGNOSIS — E876 Hypokalemia: Secondary | ICD-10-CM | POA: Diagnosis present

## 2019-03-22 DIAGNOSIS — A0472 Enterocolitis due to Clostridium difficile, not specified as recurrent: Secondary | ICD-10-CM | POA: Diagnosis present

## 2019-03-22 DIAGNOSIS — G9341 Metabolic encephalopathy: Secondary | ICD-10-CM | POA: Diagnosis present

## 2019-03-22 DIAGNOSIS — D8689 Sarcoidosis of other sites: Secondary | ICD-10-CM | POA: Diagnosis present

## 2019-03-22 DIAGNOSIS — Z79899 Other long term (current) drug therapy: Secondary | ICD-10-CM | POA: Diagnosis not present

## 2019-03-22 DIAGNOSIS — E119 Type 2 diabetes mellitus without complications: Secondary | ICD-10-CM

## 2019-03-22 DIAGNOSIS — Z91013 Allergy to seafood: Secondary | ICD-10-CM | POA: Diagnosis not present

## 2019-03-22 DIAGNOSIS — E1165 Type 2 diabetes mellitus with hyperglycemia: Secondary | ICD-10-CM | POA: Diagnosis present

## 2019-03-22 DIAGNOSIS — Z20822 Contact with and (suspected) exposure to covid-19: Secondary | ICD-10-CM | POA: Diagnosis present

## 2019-03-22 DIAGNOSIS — I10 Essential (primary) hypertension: Secondary | ICD-10-CM | POA: Diagnosis present

## 2019-03-22 DIAGNOSIS — J45901 Unspecified asthma with (acute) exacerbation: Secondary | ICD-10-CM | POA: Diagnosis present

## 2019-03-22 DIAGNOSIS — Z7951 Long term (current) use of inhaled steroids: Secondary | ICD-10-CM | POA: Diagnosis not present

## 2019-03-22 LAB — CBC
HCT: 37.9 % (ref 36.0–46.0)
Hemoglobin: 12.3 g/dL (ref 12.0–15.0)
MCH: 28.5 pg (ref 26.0–34.0)
MCHC: 32.5 g/dL (ref 30.0–36.0)
MCV: 87.9 fL (ref 80.0–100.0)
Platelets: 481 10*3/uL — ABNORMAL HIGH (ref 150–400)
RBC: 4.31 MIL/uL (ref 3.87–5.11)
RDW: 13 % (ref 11.5–15.5)
WBC: 6.8 10*3/uL (ref 4.0–10.5)
nRBC: 0 % (ref 0.0–0.2)

## 2019-03-22 LAB — BASIC METABOLIC PANEL
Anion gap: 14 (ref 5–15)
BUN: 10 mg/dL (ref 8–23)
CO2: 27 mmol/L (ref 22–32)
Calcium: 8.9 mg/dL (ref 8.9–10.3)
Chloride: 95 mmol/L — ABNORMAL LOW (ref 98–111)
Creatinine, Ser: 1.51 mg/dL — ABNORMAL HIGH (ref 0.44–1.00)
GFR calc Af Amer: 41 mL/min — ABNORMAL LOW (ref >60)
GFR calc non Af Amer: 35 mL/min — ABNORMAL LOW (ref >60)
Glucose, Bld: 156 mg/dL — ABNORMAL HIGH (ref 70–99)
Potassium: 3.5 mmol/L (ref 3.5–5.1)
Sodium: 136 mmol/L (ref 135–145)

## 2019-03-22 LAB — TSH: TSH: 1.71 u[IU]/mL (ref 0.350–4.500)

## 2019-03-22 LAB — C DIFFICILE QUICK SCREEN W PCR REFLEX
C Diff antigen: POSITIVE — AB
C Diff toxin: NEGATIVE

## 2019-03-22 LAB — MAGNESIUM: Magnesium: 1.7 mg/dL (ref 1.7–2.4)

## 2019-03-22 LAB — HIV ANTIBODY (ROUTINE TESTING W REFLEX): HIV Screen 4th Generation wRfx: NONREACTIVE

## 2019-03-22 LAB — CLOSTRIDIUM DIFFICILE BY PCR, REFLEXED: Toxigenic C. Difficile by PCR: POSITIVE — AB

## 2019-03-22 MED ORDER — GUAIFENESIN ER 600 MG PO TB12
600.0000 mg | ORAL_TABLET | Freq: Two times a day (BID) | ORAL | Status: DC
  Administered 2019-03-22 – 2019-03-25 (×6): 600 mg via ORAL
  Filled 2019-03-22 (×7): qty 1

## 2019-03-22 MED ORDER — METRONIDAZOLE 500 MG PO TABS: 500.0000 mg | ORAL_TABLET | Freq: Three times a day (TID) | ORAL | Status: DC

## 2019-03-22 MED ORDER — FLUTICASONE PROPIONATE 50 MCG/ACT NA SUSP
2.0000 | Freq: Every day | NASAL | Status: DC
  Administered 2019-03-22 – 2019-03-25 (×4): 2 via NASAL
  Filled 2019-03-22: qty 16

## 2019-03-22 MED ORDER — CARVEDILOL 12.5 MG PO TABS
12.5000 mg | ORAL_TABLET | Freq: Two times a day (BID) | ORAL | Status: DC
  Administered 2019-03-22 – 2019-03-25 (×8): 12.5 mg via ORAL
  Filled 2019-03-22 (×5): qty 1
  Filled 2019-03-22 (×2): qty 2
  Filled 2019-03-22: qty 1

## 2019-03-22 MED ORDER — METHYLPREDNISOLONE SODIUM SUCC 125 MG IJ SOLR
60.0000 mg | Freq: Two times a day (BID) | INTRAMUSCULAR | Status: DC
  Administered 2019-03-23: 02:00:00 60 mg via INTRAVENOUS
  Filled 2019-03-22: qty 2

## 2019-03-22 MED ORDER — ENOXAPARIN SODIUM 40 MG/0.4ML ~~LOC~~ SOLN
40.0000 mg | SUBCUTANEOUS | Status: DC
  Administered 2019-03-22 – 2019-03-25 (×4): 40 mg via SUBCUTANEOUS
  Filled 2019-03-22 (×4): qty 0.4

## 2019-03-22 MED ORDER — VANCOMYCIN 50 MG/ML ORAL SOLUTION
125.0000 mg | Freq: Four times a day (QID) | ORAL | Status: DC
  Administered 2019-03-22 – 2019-03-24 (×11): 125 mg via ORAL
  Filled 2019-03-22 (×14): qty 2.5

## 2019-03-22 MED ORDER — LEVOTHYROXINE SODIUM 25 MCG PO TABS
25.0000 ug | ORAL_TABLET | Freq: Every day | ORAL | Status: DC
  Administered 2019-03-22 – 2019-03-24 (×3): 25 ug via ORAL
  Filled 2019-03-22 (×4): qty 1

## 2019-03-22 MED ORDER — ATORVASTATIN CALCIUM 20 MG PO TABS
40.0000 mg | ORAL_TABLET | Freq: Every day | ORAL | Status: DC
  Administered 2019-03-22 – 2019-03-25 (×4): 40 mg via ORAL
  Filled 2019-03-22 (×4): qty 2

## 2019-03-22 MED ORDER — ONDANSETRON HCL 4 MG PO TABS: 4.0000 mg | ORAL_TABLET | Freq: Four times a day (QID) | ORAL | Status: DC | PRN

## 2019-03-22 MED ORDER — MONTELUKAST SODIUM 10 MG PO TABS
10.0000 mg | ORAL_TABLET | Freq: Every day | ORAL | Status: DC
  Administered 2019-03-22 – 2019-03-23 (×2): 10 mg via ORAL
  Filled 2019-03-22 (×4): qty 1

## 2019-03-22 MED ORDER — PREDNISONE 2.5 MG PO TABS
2.5000 mg | ORAL_TABLET | Freq: Every day | ORAL | Status: DC
  Filled 2019-03-22: qty 1

## 2019-03-22 MED ORDER — TRAZODONE HCL 50 MG PO TABS
25.0000 mg | ORAL_TABLET | Freq: Every evening | ORAL | Status: DC | PRN
  Filled 2019-03-22: qty 1

## 2019-03-22 MED ORDER — MAGNESIUM HYDROXIDE 400 MG/5ML PO SUSP: 30.0000 mL | Freq: Every day | ORAL | Status: DC | PRN

## 2019-03-22 MED ORDER — ONDANSETRON HCL 4 MG/2ML IJ SOLN: 4.0000 mg | Freq: Four times a day (QID) | INTRAMUSCULAR | Status: DC | PRN

## 2019-03-22 MED ORDER — POTASSIUM CHLORIDE CRYS ER 20 MEQ PO TBCR
20.0000 meq | EXTENDED_RELEASE_TABLET | Freq: Every day | ORAL | Status: DC
  Administered 2019-03-22 – 2019-03-25 (×4): 20 meq via ORAL
  Filled 2019-03-22 (×4): qty 1

## 2019-03-22 MED ORDER — FERROUS SULFATE 325 (65 FE) MG PO TABS
325.0000 mg | ORAL_TABLET | Freq: Two times a day (BID) | ORAL | Status: DC
  Administered 2019-03-22 – 2019-03-25 (×7): 325 mg via ORAL
  Filled 2019-03-22 (×8): qty 1

## 2019-03-22 MED ORDER — LORATADINE 10 MG PO TABS
10.0000 mg | ORAL_TABLET | Freq: Every day | ORAL | Status: DC
  Administered 2019-03-22 – 2019-03-25 (×4): 10 mg via ORAL
  Filled 2019-03-22 (×4): qty 1

## 2019-03-22 MED ORDER — GUAIFENESIN ER 600 MG PO TB12
600.0000 mg | ORAL_TABLET | ORAL | Status: DC | PRN
  Administered 2019-03-22: 17:00:00 600 mg via ORAL
  Filled 2019-03-22: qty 1

## 2019-03-22 MED ORDER — AMLODIPINE BESYLATE 10 MG PO TABS
10.0000 mg | ORAL_TABLET | Freq: Every day | ORAL | Status: DC
  Administered 2019-03-22 – 2019-03-25 (×4): 10 mg via ORAL
  Filled 2019-03-22 (×3): qty 1
  Filled 2019-03-22: qty 2

## 2019-03-22 MED ORDER — METHYLPREDNISOLONE SODIUM SUCC 125 MG IJ SOLR
60.0000 mg | Freq: Three times a day (TID) | INTRAMUSCULAR | Status: DC
  Administered 2019-03-22 (×2): 60 mg via INTRAVENOUS
  Filled 2019-03-22 (×2): qty 2

## 2019-03-22 MED ORDER — FLUTICASONE PROPIONATE HFA 220 MCG/ACT IN AERO
1.0000 | INHALATION_SPRAY | Freq: Every day | RESPIRATORY_TRACT | Status: DC
  Administered 2019-03-23 – 2019-03-25 (×3): 1 via RESPIRATORY_TRACT
  Filled 2019-03-22: qty 12

## 2019-03-22 MED ORDER — MAGNESIUM OXIDE 400 (241.3 MG) MG PO TABS
400.0000 mg | ORAL_TABLET | Freq: Two times a day (BID) | ORAL | Status: DC
  Administered 2019-03-22 – 2019-03-25 (×6): 400 mg via ORAL
  Filled 2019-03-22 (×8): qty 1

## 2019-03-22 MED ORDER — ALBUTEROL SULFATE (2.5 MG/3ML) 0.083% IN NEBU: 2.5000 mg | INHALATION_SOLUTION | RESPIRATORY_TRACT | Status: DC | PRN

## 2019-03-22 MED ORDER — PANTOPRAZOLE SODIUM 40 MG PO TBEC
40.0000 mg | DELAYED_RELEASE_TABLET | Freq: Every day | ORAL | Status: DC
  Administered 2019-03-22 – 2019-03-25 (×4): 40 mg via ORAL
  Filled 2019-03-22 (×4): qty 1

## 2019-03-22 MED ORDER — POTASSIUM CHLORIDE IN NACL 20-0.9 MEQ/L-% IV SOLN
INTRAVENOUS | Status: DC
  Filled 2019-03-22 (×2): qty 1000

## 2019-03-22 MED ORDER — IPRATROPIUM-ALBUTEROL 0.5-2.5 (3) MG/3ML IN SOLN
3.0000 mL | Freq: Four times a day (QID) | RESPIRATORY_TRACT | Status: DC
  Administered 2019-03-22 – 2019-03-23 (×5): 3 mL via RESPIRATORY_TRACT
  Filled 2019-03-22 (×5): qty 3

## 2019-03-22 NOTE — Progress Notes (Addendum)
PROGRESS NOTE    Darlene Stafford  [id number] DOB: 03/01/1950 DOA: 03/21/2019  PCP: Katherina Mires, MD    LOS - 1   Brief Narrative:  Darlene Stafford 69 year old female with history of asthma, type 2 diabetes, hypertension, GERD, hypothyroidism, sarcoidosis and history of stroke and presented to the ED on evening of 2/15 with complaints of worsening dyspnea, productive cough, wheezing for the past couple weeks that got much worse the day before.  Other symptoms reported included frequent watery diarrhea and generalized weakness.  In the ED patient was hypotensive, tachypneic, afebrile.  Labs showed hypokalemia, AKI versus CKD (stable renal function from September 2020), troponin very mildly elevated patient without chest pain.  CBC showed thrombocytosis but otherwise normal.  Patient tested negative for flu a and B COVID-19.  She was treated with duo nebs, IV magnesium and Solu-Medrol in addition to potassium replacement and IV hydration.  Admitted to hospitalist service for further evaluation management of acute asthma exacerbation.  C. difficile was tested and returned positive.  Patient was started on oral vancomycin.   Subjective 2/16: Patient seen this morning in the ED on hold for a bed.  She reports feeling a little better than when she got here.  Denies fevers or chills.  States breathing better today.  Continues to have watery bowel movements and states she needs to get to the hospital room where she will have a bathroom close by.  Denies abdominal pain, nausea or vomiting, chest pain or other acute complaints.  Assessment & Plan:   Principal Problem:   Clostridioides difficile infection Active Problems:   Sepsis ([place])   Asthma exacerbation   Diabetes mellitus without complication ([place])   Morbid obesity due to excess calories ([place])   Sarcoidosis   Hypothyroidism   GERD (gastroesophageal reflux disease)   Sepsis secondary to C. difficile infection -present on admission, as  evidenced by hypotension, tachypnea and infection source.  No signs of overt sepsis or endorgan damage.  Vitals stabilized, hypertensive this afternoon. --Stop IV fluids --P.o. vancomycin 125 mg 4 times daily x10 days (2/16 >>2/25) --Contact precautions --Monitor blood pressure   Asthma exacerbation -present on admission.  Appears improved, without wheezing this morning on exam. -Continue Solu-Medrol 60 mg, reduce to twice daily --DuoNebs scheduled 4 times daily --Albuterol inhaler as needed --Continue Flovent inhaler --Continue Singulair, Flonase --Mucinex  Type 2 diabetes(?) Appears not to be on home medications.  Last A1c on file 6.3% in March 2020. --Check A1c --Anticipate some hyperglycemia with steroid use, monitor --CBGs 3 times daily before meals --Initiate sliding scale if glucose not controlled between 140-180  Hypokalemia, Hypomagnesemia - K 2.9, Mg 0.9 on admission, replaced. --monitor daily --replace for goal K>4, Mg>2  Hypertension -chronic, hypotensive upon admission.  Hypotension now resolved. --Continue amlodipine, Coreg  GERD -continue home PPI  Hypothyroidism -continue home levothyroxine  Sarcoidosis -appears stable, monitor --Takes prednisone 2.5 mg daily, hold while on IV steroids and resume at discharge or after taper   Morbid obesity  --Recommend calorie control and physical activity for weight loss   DVT prophylaxis: Lovenox   Code Status: Full Code  Family Communication: None at bedside during encounter  Disposition Plan: Expect discharge to prior home environment pending clinical improvement in 24 to 48 hours. Coming From home Exp DC Date 2/17-18 Barriers: Clinical improvement and PT evaluation Medically Stable for Discharge?  No   Severity of Illness: The appropriate patient status for this patient is INPATIENT. It is not anticipated that the  patient will be medically stable for discharge from the hospital within 2 midnights of admission.  The following factors support the patient status of inpatient.  "           Presenting symptoms include  generalized weakness, frequent diarrhea, shortness of breath, wheezing, cough. "           Worrisome physical exam findings include  wheezing, hypotension, tachypnea. "           Initial radiographic and laboratory data are worrisome because of  hypokalemia, elevated creatinine, profound hypomagnesemia, thrombocytosis. "           Chronic co-morbidities include  asthma, sarcoidosis, hypertension, hypothyroidism, GERD, history of stroke.   * I certify that at the point of admission it is my clinical judgment that the patient will require inpatient hospital care spanning beyond 2 midnights from the point of admission due to high intensity of service, high risk for further deterioration and high frequency of surveillance required.*   Consultants:   None  Procedures:   None  Antimicrobials:   Oral vancomycin 125 mg 4 times daily x10 days, 2/16 >> 2/25   Objective: Vitals:   03/22/19 0630 03/22/19 0800 03/22/19 0900 03/22/19 1000  BP: 115/66 117/67 138/79 (!) 156/96  Pulse: 77 74 74 67  Resp: (!) 27 (!) 22    Temp:      TempSrc:      SpO2: 93% 96%  100%  Weight:      Height:       No intake or output data in the 24 hours ending 03/22/19 1703 Filed Weights   03/21/19 1809  Weight: 90.7 kg    Examination:  General exam: awake, alert, no acute distress, morbidly obese HEENT: moist mucus membranes, hearing grossly normal  Respiratory system: Decreased breath sounds, faint expiratory wheezes, no rales or rhonchi, normal respiratory effort.  On room air. Cardiovascular system: normal S1/S2, RRR, no JVD, murmurs, rubs, gallops, no lower extremity edema.   Gastrointestinal system: soft, NT, ND, no HSM felt, +bowel sounds. Central nervous system: A&O x3. no gross focal neurologic deficits, normal speech Extremities: moves all, no edema, normal tone Skin: dry, intact, normal  temperature, normal color Psychiatry: normal mood, congruent affect, judgement and insight appear normal    Data Reviewed: I have personally reviewed following labs and imaging studies  CBC: Recent Labs  Lab 03/21/19 1827 03/22/19 0349  WBC 9.3 6.8  HGB 13.2 12.3  HCT 38.6 37.9  MCV 86.2 87.9  PLT 612* [id number]*   Basic Metabolic Panel: Recent Labs  Lab 03/21/19 1827 03/22/19 0349  NA 135 136  K 2.9* 3.5  CL 90* 95*  CO2 30 27  GLUCOSE 129* 156*  BUN 8 10  CREATININE 1.47* 1.51*  CALCIUM 9.1 8.9  MG 0.9* 1.7   GFR: Estimated Creatinine Clearance: 36.6 mL/min (A) (by C-G formula based on SCr of 1.51 mg/dL (H)). Liver Function Tests: Recent Labs  Lab 03/21/19 1959  AST 26  ALT 7  ALKPHOS 49  BILITOT 2.3*  PROT 7.3  ALBUMIN 3.4*   No results for input(s): LIPASE, AMYLASE in the last 168 hours. No results for input(s): AMMONIA in the last 168 hours. Coagulation Profile: No results for input(s): INR, PROTIME in the last 168 hours. Cardiac Enzymes: No results for input(s): CKTOTAL, CKMB, CKMBINDEX, TROPONINI in the last 168 hours. BNP (last 3 results) No results for input(s): PROBNP in the last 8760 hours. HbA1C: No results for input(s): HGBA1C  in the last 72 hours. CBG: No results for input(s): GLUCAP in the last 168 hours. Lipid Profile: No results for input(s): CHOL, HDL, LDLCALC, TRIG, CHOLHDL, LDLDIRECT in the last 72 hours. Thyroid Function Tests: Recent Labs    03/22/19 0349  TSH 1.710   Anemia Panel: No results for input(s): VITAMINB12, FOLATE, FERRITIN, TIBC, IRON, RETICCTPCT in the last 72 hours. Sepsis Labs: No results for input(s): PROCALCITON, LATICACIDVEN in the last 168 hours.  Recent Results (from the past 240 hour(s))  Respiratory Panel by RT PCR (Flu A&B, Covid) - Nasopharyngeal Swab     Status: None   Collection Time: 03/21/19  7:59 PM   Specimen: Nasopharyngeal Swab  Result Value Ref Range Status   SARS Coronavirus 2 by RT PCR  NEGATIVE NEGATIVE Final    Comment: (NOTE) SARS-CoV-2 target nucleic acids are NOT DETECTED. The SARS-CoV-2 RNA is generally detectable in upper respiratoy specimens during the acute phase of infection. The lowest concentration of SARS-CoV-2 viral copies this assay can detect is 131 copies/mL. A negative result does not preclude SARS-Cov-2 infection and should not be used as the sole basis for treatment or other patient management decisions. A negative result may occur with  improper specimen collection/handling, submission of specimen other than nasopharyngeal swab, presence of viral mutation(s) within the areas targeted by this assay, and inadequate number of viral copies (<131 copies/mL). A negative result must be combined with clinical observations, patient history, and epidemiological information. The expected result is Negative. Fact Sheet for Patients:  PinkCheek.be Fact Sheet for Healthcare Providers:  GravelBags.it This test is not yet ap proved or cleared by the [place] FDA and  has been authorized for detection and/or diagnosis of SARS-CoV-2 by FDA under an Emergency Use Authorization (EUA). This EUA will remain  in effect (meaning this test can be used) for the duration of the COVID-19 declaration under Section 564(b)(1) of the Act, 21 U.S.C. section 360bbb-3(b)(1), unless the authorization is terminated or revoked sooner.    Influenza A by PCR NEGATIVE NEGATIVE Final   Influenza B by PCR NEGATIVE NEGATIVE Final    Comment: (NOTE) The Xpert Xpress SARS-CoV-2/FLU/RSV assay is intended as an aid in  the diagnosis of influenza from Nasopharyngeal swab specimens and  should not be used as a sole basis for treatment. Nasal washings and  aspirates are unacceptable for Xpert Xpress SARS-CoV-2/FLU/RSV  testing. Fact Sheet for Patients: PinkCheek.be Fact Sheet for Healthcare  Providers: GravelBags.it This test is not yet approved or cleared by the [place] FDA and  has been authorized for detection and/or diagnosis of SARS-CoV-2 by  FDA under an Emergency Use Authorization (EUA). This EUA will remain  in effect (meaning this test can be used) for the duration of the  Covid-19 declaration under Section 564(b)(1) of the Act, 21  U.S.C. section 360bbb-3(b)(1), unless the authorization is  terminated or revoked. Performed at [facility], [place], [place] [address]   C Difficile Quick Screen w PCR reflex     Status: Abnormal   Collection Time: 03/22/19  1:53 AM   Specimen: Stool  Result Value Ref Range Status   C Diff antigen POSITIVE (A) NEGATIVE Final   C Diff toxin NEGATIVE NEGATIVE Final   C Diff interpretation Results are indeterminate. See PCR results.  Final    Comment: Performed at [facility], [address] [place], [place] [address]  C. Diff by PCR, Reflexed     Status: Abnormal   Collection  Time: 03/22/19  1:53 AM  Result Value Ref Range Status   Toxigenic C. Difficile by PCR POSITIVE (A) NEGATIVE Final    Comment: Positive for toxigenic C. difficile with little to no toxin production. Only treat if clinical presentation suggests symptomatic illness. Performed at [facility], [address] [place], [place] [address]          Radiology Studies: DG Chest Portable 1 View  Result Date: 03/21/2019 CLINICAL DATA:  Weakness, nausea, vomiting, diarrhea, febrile, history of sarcoidosis EXAM: PORTABLE CHEST 1 VIEW COMPARISON:  04/15/2018 FINDINGS: Single frontal view of the chest demonstrates an unremarkable cardiac silhouette. Chronic scarring and fibrosis of the right chest unchanged. No new airspace disease, effusion, or pneumothorax. No acute bony abnormalities. IMPRESSION: 1. Stable scarring and fibrosis.  No acute intrathoracic process. Electronically Signed   By:  Randa Ngo M.D.   On: 03/21/2019 20:17        Scheduled Meds: . amLODipine  10 mg Oral Daily  . atorvastatin  40 mg Oral q1800  . carvedilol  12.5 mg Oral BID WC  . enoxaparin (LOVENOX) injection  40 mg Subcutaneous Q24H  . ferrous sulfate  325 mg Oral BID WC  . fluticasone  2 spray Each Nare Daily  . fluticasone  1 puff Inhalation Daily  . guaiFENesin  600 mg Oral BID  . ipratropium-albuterol  3 mL Nebulization QID  . levothyroxine  25 mcg Oral Q0600  . loratadine  10 mg Oral Daily  . magnesium oxide  400 mg Oral BID  . [START ON 03/23/2019] methylPREDNISolone (SOLU-MEDROL) injection  60 mg Intravenous Q12H  . montelukast  10 mg Oral QHS  . pantoprazole  40 mg Oral Daily  . potassium chloride SA  20 mEq Oral Daily  . vancomycin  125 mg Oral QID   Continuous Infusions:    LOS: 1 day    Time spent: 40 to 45 minutes    Ezekiel Slocumb, DO [facility]   If 7PM-7AM, please contact night-coverage [email or web address] 03/22/2019, 5:03 PM

## 2019-03-22 NOTE — ED Notes (Addendum)
Pt offered breakfast tray; declines at this time.  

## 2019-03-22 NOTE — ED Notes (Signed)
Lunch tray provided; pt able to feed self. 

## 2019-03-23 LAB — BASIC METABOLIC PANEL
Anion gap: 13 (ref 5–15)
BUN: 13 mg/dL (ref 8–23)
CO2: 25 mmol/L (ref 22–32)
Calcium: 8.8 mg/dL — ABNORMAL LOW (ref 8.9–10.3)
Chloride: 98 mmol/L (ref 98–111)
Creatinine, Ser: 1.28 mg/dL — ABNORMAL HIGH (ref 0.44–1.00)
GFR calc Af Amer: 50 mL/min — ABNORMAL LOW (ref >60)
GFR calc non Af Amer: 43 mL/min — ABNORMAL LOW (ref >60)
Glucose, Bld: 195 mg/dL — ABNORMAL HIGH (ref 70–99)
Potassium: 3.8 mmol/L (ref 3.5–5.1)
Sodium: 136 mmol/L (ref 135–145)

## 2019-03-23 LAB — CBC
HCT: 33.6 % — ABNORMAL LOW (ref 36.0–46.0)
Hemoglobin: 11.2 g/dL — ABNORMAL LOW (ref 12.0–15.0)
MCH: 28.9 pg (ref 26.0–34.0)
MCHC: 33.3 g/dL (ref 30.0–36.0)
MCV: 86.8 fL (ref 80.0–100.0)
Platelets: 450 10*3/uL — ABNORMAL HIGH (ref 150–400)
RBC: 3.87 MIL/uL (ref 3.87–5.11)
RDW: 13 % (ref 11.5–15.5)
WBC: 10.1 10*3/uL (ref 4.0–10.5)
nRBC: 0 % (ref 0.0–0.2)

## 2019-03-23 LAB — HEMOGLOBIN A1C
Hgb A1c MFr Bld: 5 % (ref 4.8–5.6)
Mean Plasma Glucose: 96.8 mg/dL

## 2019-03-23 LAB — GLUCOSE, CAPILLARY
Glucose-Capillary: 174 mg/dL — ABNORMAL HIGH (ref 70–99)
Glucose-Capillary: 181 mg/dL — ABNORMAL HIGH (ref 70–99)
Glucose-Capillary: 204 mg/dL — ABNORMAL HIGH (ref 70–99)

## 2019-03-23 LAB — MAGNESIUM: Magnesium: 1.4 mg/dL — ABNORMAL LOW (ref 1.7–2.4)

## 2019-03-23 MED ORDER — SODIUM CHLORIDE 0.9 % IV BOLUS
500.0000 mL | Freq: Once | INTRAVENOUS | Status: AC
  Administered 2019-03-23: 07:00:00 500 mL via INTRAVENOUS

## 2019-03-23 MED ORDER — MAGNESIUM SULFATE 2 GM/50ML IV SOLN
2.0000 g | Freq: Once | INTRAVENOUS | Status: AC
  Administered 2019-03-23: 2 g via INTRAVENOUS
  Filled 2019-03-23: qty 50

## 2019-03-23 MED ORDER — IPRATROPIUM-ALBUTEROL 0.5-2.5 (3) MG/3ML IN SOLN
3.0000 mL | Freq: Three times a day (TID) | RESPIRATORY_TRACT | Status: DC
  Administered 2019-03-23 – 2019-03-25 (×7): 3 mL via RESPIRATORY_TRACT
  Filled 2019-03-23 (×7): qty 3

## 2019-03-23 MED ORDER — PREDNISONE 20 MG PO TABS
40.0000 mg | ORAL_TABLET | Freq: Every day | ORAL | Status: DC
  Administered 2019-03-24: 40 mg via ORAL
  Filled 2019-03-23: qty 2

## 2019-03-23 NOTE — TOC Initial Note (Signed)
Transition of Care (TOC) - Initial/Assessment Note    Patient Details  Name: Darlene Stafford MRN: [id number] Date of Birth: 06/03/1950  Transition of Care (TOC) CM/SW Contact:    ,  G, LCSW Phone Number: 03/23/2019, 11:55 AM  Clinical Narrative:    Met briefly with pt who is quite confused and only oriented to person. She does not know th reason she is in the hospital. Contacted granddaughter-Melisha via telephone who reports she is very different and quite confused. She was bont prior to admission and she reports when she is on antibiotics and comes to the hospital she becomes this way and the MD has to cut down the strength of the medication she is taking. She is independent with her rolling walker at home and only needs assist with bathing, dressing and meal prep, which granddaughter assists her with. Granddaughter reports while she is at work her fiance is there assisting her and their son. So she has someone with her 24 hr per day. Granddaughter wants RN and MD to call her to discuss her medical issues. Discussed will continue to follow and work on discharge plans. Granddaughter wants to take pt home and avoid any SNF/Rehab if possible.               Expected Discharge Plan: Home w Home Health Services Barriers to Discharge: Continued Medical Work up   Patient Goals and CMS Choice Patient states their goals for this hospitalization and ongoing recovery are:: Pt confused asked: Where am I? She did not realize she is in the hospital      Expected Discharge Plan and Services Expected Discharge Plan: Home w Home Health Services In-house Referral: Clinical Social Work     Living arrangements for the past 2 months: Single Family Home                                      Prior Living Arrangements/Services Living arrangements for the past 2 months: Single Family Home Lives with:: Relatives, Other (Comment)(granddaughter's fiance)          Need for Family  Participation in Patient Care: Yes (Comment) Care giver support system in place?: Yes (comment) Current home services: DME(rw and cane. has had [facility] in the past)    Activities of Daily Living Home Assistive Devices/Equipment: Walker (specify type), Dentures (specify type), Eyeglasses ADL Screening (condition at time of admission) Patient's cognitive ability adequate to safely complete daily activities?: Yes Is the patient deaf or have difficulty hearing?: No Does the patient have difficulty seeing, even when wearing glasses/contacts?: No Does the patient have difficulty concentrating, remembering, or making decisions?: Yes Patient able to express need for assistance with ADLs?: Yes Does the patient have difficulty dressing or bathing?: No Independently performs ADLs?: Yes (appropriate for developmental age) Does the patient have difficulty walking or climbing stairs?: Yes Weakness of Legs: Both Weakness of Arms/Hands: Both  Permission Sought/Granted Permission sought to share information with : Family Supports Permission granted to share information with : Yes, Verbal Permission Granted  Share Information with NAME: Melisha-grandaughter           Emotional Assessment Appearance:: Appears stated age Attitude/Demeanor/Rapport: Inconsistent, Other (comment)(confused) Affect (typically observed): Adaptable Orientation: : Oriented to Self      Admission diagnosis:  Dehydration [E86.0] Hypokalemia [E87.6] Hypomagnesemia [E83.42] Weakness [R53.1] AKI (acute kidney injury) (HCC) [N17.9] Asthma exacerbation [J45.901] Exacerbation of asthma, unspecified asthma severity,   unspecified whether persistent [J45.901] Clostridioides difficile infection [A49.8] Patient Active Problem List   Diagnosis Date Noted  . 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 Hypothyroidism 03/22/2019  . GERD (gastroesophageal reflux disease) 03/22/2019  . Asthma exacerbation 03/21/2019  . Pressure  injury of skin 04/14/2018  . Acute bronchitis 04/30/2017  . Hemoptysis 04/30/2017  . Sarcoidosis 02/03/2017  . Morbid obesity due to excess calories ([place])   . CAP (community acquired pneumonia) 04/10/2015  . Diabetes mellitus without complication ([place]) 35/70/1779  . Asthma 04/10/2015  . Acute encephalopathy 04/10/2015  . UTI (lower urinary tract infection) 04/10/2015  . AKI (acute kidney injury) ([place]) 04/10/2015  . Sepsis ([place]) 04/10/2015  . Hypotension 04/10/2015   PCP:  Katherina Mires, MD Pharmacy:   [facility] [address] [place] [address] Phone: [phone] Fax: [phone]     Social Determinants of Health (SDOH) Interventions    Readmission Risk Interventions No flowsheet data found.

## 2019-03-23 NOTE — Progress Notes (Signed)
PROGRESS NOTE    Darlene Stafford  [id number] DOB: [birth year] DOA: 03/21/2019  PCP: Katherina Mires, MD    LOS - 2   Brief Narrative:  Darlene Stafford 69 year old female with history of asthma, type 2 diabetes, hypertension, GERD, hypothyroidism, sarcoidosis and history of stroke and presented to the ED on evening of 2/15 with complaints of worsening dyspnea, productive cough, wheezing for the past couple weeks that got much worse the day before.  Other symptoms reported included frequent watery diarrhea and generalized weakness.  In the ED patient was hypotensive, tachypneic, afebrile.  Labs showed hypokalemia, AKI versus CKD (stable renal function from September 2020).  CBC showed thrombocytosis but otherwise normal.  Patient tested negative for flu A and B COVID-19.  She was treated in the ED with duo nebs, IV magnesium and Solu-Medrol in addition to potassium replacement and IV hydration. Admitted to hospitalist service for further evaluation management of acute asthma exacerbation.  C. difficile returned positive.  Patient was started on oral vancomycin.  Subjective 2/17: Patient seen this morning, RN at bedside.  Patient is asking for depends which she wears at home.  Does still have diarrhea but less frequent.  No fevers or chills.  No abdominal pain or nausea or vomiting.  Patient states she thinks her breathing is okay.  No acute events reported.  Spoke with patient's granddaughter by phone for updates this afternoon.  Patient is confused and oriented only to self, is not sure why she is in the hospital.  Granddaughter reported that patient typically gets this way when in the hospital, she attributes to antibiotics.  States patient is very sensitive to medications.  States at baseline, patient's cognition is intact.    Assessment & Plan:   Principal Problem:   Clostridioides difficile infection Active Problems:   Sepsis ([place])   Asthma exacerbation   Diabetes mellitus without  complication ([place])   Morbid obesity due to excess calories ([place])   Sarcoidosis   Hypothyroidism   GERD (gastroesophageal reflux disease)   Sepsis secondary to C. difficile infection -present on admission, as evidenced by hypotension, tachypnea and infection source.  No signs of overt sepsis or endorgan damage.  Vitals stabilized.   --P.o. vancomycin 125 mg 4 times daily x10 days (2/16 >>2/25) --Consider ID consult --Contact precautions  Asthma exacerbation -present on admission.   Appears nearly resolved, minimal wheezing this morning on exam. -Continue Solu-Medrol 60 mg today --Transition to prednisone tomorrow  --DuoNebs scheduled 4 times daily --Albuterol inhaler as needed --Continue Flovent inhaler --Continue Singulair, Flonase --Mucinex  Type 2 diabetes(?) Appears not to be on home medications.  Last A1c on file 6.3% in March 2020.   A1c checked this admission was 5.0% --Anticipate some hyperglycemia with steroid use, monitor --CBGs 3 times daily before meals --Initiate sliding scale if glucose not controlled 140-180  Hypokalemia, Hypomagnesemia - K 2.9, Mg 0.9 on admission, replaced. --monitor daily --replace for goal K>4, Mg>2  Hypertension -chronic, hypotensive upon admission.  Hypotension now resolved. --Continue amlodipine, Coreg  GERD -continue home PPI  Hypothyroidism -continue home levothyroxine  Sarcoidosis -appears stable, monitor --Takes prednisone 2.5 mg daily, hold while on IV steroids and resume at discharge or after taper   Morbid obesity  --Recommend calorie control and physical activity for weight loss   DVT prophylaxis: Lovenox   Code Status: Full Code  Family Communication: spoke to granddaughter and daughter by phone this afternoon, updated on plan and all questions answered.  Primary contact is patient's granddaughter  Darlene Stafford (first contact on Facesheet) who is patient's primary caregiver.  Disposition Plan: Anticipate patient to  be medically stable for discharge in 1-2 more days, pending improvement and diarrhea antibiotics stable.  Asthma appears stable at this point. Coming From home Exp DC Date to 18-19 Barriers clinical improvement and therapy evaluation Medically Stable for Discharge?  No  Consultants:   None  Procedures:   None  Antimicrobials:   P.o. vancomycin for C. Difficile  (2/16 >>2/25)   Objective: Vitals:   03/23/19 0606 03/23/19 0757 03/23/19 0809 03/23/19 0811  BP: (!) 118/52 (!) 108/51 (!) 114/55   Pulse: 71 66 67   Resp: 20 15    Temp: 98.7 F (37.1 C) 98.1 F (36.7 C) 97.6 F (36.4 C)   TempSrc: Axillary Oral Oral   SpO2: 100% 100% 100% 100%  Weight:      Height:        Intake/Output Summary (Last 24 hours) at 03/23/2019 1244 Last data filed at 03/23/2019 0835 Gross per 24 hour  Intake 1544.45 ml  Output 100 ml  Net 1444.45 ml   Filed Weights   03/21/19 1809  Weight: 90.7 kg    Examination:  General exam: awake, alert, no acute distress, obese, confused HEENT: moist mucus membranes, hearing grossly normal  Respiratory system: CTAB with decreased air movement, minimal expiratory wheezes, no rales or rhonchi, normal respiratory effort.  On room air Cardiovascular system: normal S1/S2, RRR, no JVD, murmurs, rubs, gallops, no pedal edema.   Gastrointestinal system: soft, NT, ND, no HSM felt, +bowel sounds. Central nervous system: Oriented to self. no gross focal neurologic deficits, normal speech Extremities: moves all, no edema, normal tone Skin: dry, intact, normal temperature, normal color Psychiatry: normal mood, congruent affect, judgement and insight appear normal    Data Reviewed: I have personally reviewed following labs and imaging studies  CBC: Recent Labs  Lab 03/21/19 1827 03/22/19 0349 03/23/19 0423  WBC 9.3 6.8 10.1  HGB 13.2 12.3 11.2*  HCT 38.6 37.9 33.6*  MCV 86.2 87.9 86.8  PLT 612* 481* [id number]*   Basic Metabolic Panel: Recent Labs  Lab  03/21/19 1827 03/22/19 0349 03/23/19 0423  NA 135 136 136  K 2.9* 3.5 3.8  CL 90* 95* 98  CO2 30 27 25   GLUCOSE 129* 156* 195*  BUN 8 10 13   CREATININE 1.47* 1.51* 1.28*  CALCIUM 9.1 8.9 8.8*  MG 0.9* 1.7 1.4*   GFR: Estimated Creatinine Clearance: 43.2 mL/min (A) (by C-G formula based on SCr of 1.28 mg/dL (H)). Liver Function Tests: Recent Labs  Lab 03/21/19 1959  AST 26  ALT 7  ALKPHOS 49  BILITOT 2.3*  PROT 7.3  ALBUMIN 3.4*   No results for input(s): LIPASE, AMYLASE in the last 168 hours. No results for input(s): AMMONIA in the last 168 hours. Coagulation Profile: No results for input(s): INR, PROTIME in the last 168 hours. Cardiac Enzymes: No results for input(s): CKTOTAL, CKMB, CKMBINDEX, TROPONINI in the last 168 hours. BNP (last 3 results) No results for input(s): PROBNP in the last 8760 hours. HbA1C: Recent Labs    03/23/19 0423  HGBA1C 5.0   CBG: Recent Labs  Lab 03/23/19 0833 03/23/19 1202  GLUCAP 174* 181*   Lipid Profile: No results for input(s): CHOL, HDL, LDLCALC, TRIG, CHOLHDL, LDLDIRECT in the last 72 hours. Thyroid Function Tests: Recent Labs    03/22/19 0349  TSH 1.710   Anemia Panel: No results for input(s): VITAMINB12, FOLATE, FERRITIN, TIBC, IRON, RETICCTPCT  in the last 72 hours. Sepsis Labs: No results for input(s): PROCALCITON, LATICACIDVEN in the last 168 hours.  Recent Results (from the past 240 hour(s))  Respiratory Panel by RT PCR (Flu A&B, Covid) - Nasopharyngeal Swab     Status: None   Collection Time: 03/21/19  7:59 PM   Specimen: Nasopharyngeal Swab  Result Value Ref Range Status   SARS Coronavirus 2 by RT PCR NEGATIVE NEGATIVE Final    Comment: (NOTE) SARS-CoV-2 target nucleic acids are NOT DETECTED. The SARS-CoV-2 RNA is generally detectable in upper respiratoy specimens during the acute phase of infection. The lowest concentration of SARS-CoV-2 viral copies this assay can detect is 131 copies/mL. A negative  result does not preclude SARS-Cov-2 infection and should not be used as the sole basis for treatment or other patient management decisions. A negative result may occur with  improper specimen collection/handling, submission of specimen other than nasopharyngeal swab, presence of viral mutation(s) within the areas targeted by this assay, and inadequate number of viral copies (<131 copies/mL). A negative result must be combined with clinical observations, patient history, and epidemiological information. The expected result is Negative. Fact Sheet for Patients:  PinkCheek.be Fact Sheet for Healthcare Providers:  GravelBags.it This test is not yet ap proved or cleared by the [place] FDA and  has been authorized for detection and/or diagnosis of SARS-CoV-2 by FDA under an Emergency Use Authorization (EUA). This EUA will remain  in effect (meaning this test can be used) for the duration of the COVID-19 declaration under Section 564(b)(1) of the Act, 21 U.S.C. section 360bbb-3(b)(1), unless the authorization is terminated or revoked sooner.    Influenza A by PCR NEGATIVE NEGATIVE Final   Influenza B by PCR NEGATIVE NEGATIVE Final    Comment: (NOTE) The Xpert Xpress SARS-CoV-2/FLU/RSV assay is intended as an aid in  the diagnosis of influenza from Nasopharyngeal swab specimens and  should not be used as a sole basis for treatment. Nasal washings and  aspirates are unacceptable for Xpert Xpress SARS-CoV-2/FLU/RSV  testing. Fact Sheet for Patients: PinkCheek.be Fact Sheet for Healthcare Providers: GravelBags.it This test is not yet approved or cleared by the [place] FDA and  has been authorized for detection and/or diagnosis of SARS-CoV-2 by  FDA under an Emergency Use Authorization (EUA). This EUA will remain  in effect (meaning this test can be used) for the  duration of the  Covid-19 declaration under Section 564(b)(1) of the Act, 21  U.S.C. section 360bbb-3(b)(1), unless the authorization is  terminated or revoked. Performed at [facility], [place], [place] [address]   C Difficile Quick Screen w PCR reflex     Status: Abnormal   Collection Time: 03/22/19  1:53 AM   Specimen: Stool  Result Value Ref Range Status   C Diff antigen POSITIVE (A) NEGATIVE Final   C Diff toxin NEGATIVE NEGATIVE Final   C Diff interpretation Results are indeterminate. See PCR results.  Final    Comment: Performed at [facility], [address] [place], [place] [address]  C. Diff by PCR, Reflexed     Status: Abnormal   Collection Time: 03/22/19  1:53 AM  Result Value Ref Range Status   Toxigenic C. Difficile by PCR POSITIVE (A) NEGATIVE Final    Comment: Positive for toxigenic C. difficile with little to no toxin production. Only treat if clinical presentation suggests symptomatic illness. Performed at [facility], [address] [place], [place] [address]  Radiology Studies: DG Chest Portable 1 View  Result Date: 03/21/2019 CLINICAL DATA:  Weakness, nausea, vomiting, diarrhea, febrile, history of sarcoidosis EXAM: PORTABLE CHEST 1 VIEW COMPARISON:  04/15/2018 FINDINGS: Single frontal view of the chest demonstrates an unremarkable cardiac silhouette. Chronic scarring and fibrosis of the right chest unchanged. No new airspace disease, effusion, or pneumothorax. No acute bony abnormalities. IMPRESSION: 1. Stable scarring and fibrosis.  No acute intrathoracic process. Electronically Signed   By: Randa Ngo M.D.   On: 03/21/2019 20:17        Scheduled Meds: . amLODipine  10 mg Oral Daily  . atorvastatin  40 mg Oral q1800  . carvedilol  12.5 mg Oral BID WC  . enoxaparin (LOVENOX) injection  40 mg Subcutaneous Q24H  . ferrous sulfate  325 mg Oral BID WC  . fluticasone  2 spray Each Nare Daily   . fluticasone  1 puff Inhalation Daily  . guaiFENesin  600 mg Oral BID  . ipratropium-albuterol  3 mL Nebulization TID  . levothyroxine  25 mcg Oral Q0600  . loratadine  10 mg Oral Daily  . magnesium oxide  400 mg Oral BID  . methylPREDNISolone (SOLU-MEDROL) injection  60 mg Intravenous Q12H  . montelukast  10 mg Oral QHS  . pantoprazole  40 mg Oral Daily  . potassium chloride SA  20 mEq Oral Daily  . vancomycin  125 mg Oral QID   Continuous Infusions:   LOS: 2 days    Time spent: 35 to 40 minutes    Ezekiel Slocumb, DO [facility]   If 7PM-7AM, please contact night-coverage [email or web address] 03/23/2019, 12:44 PM

## 2019-03-23 NOTE — Progress Notes (Signed)
PT Cancellation Note  Patient Details Name: Darlene Stafford MRN: [id number] DOB: Mar 18, 1950   Cancelled Treatment:    Reason Eval/Treat Not Completed: Patient declined, no reason specified;Other (comment)(Patient consult recieved and reviewed. Attempted to mobilize patient for >25 minutes unsuccessfully due to patient refusal to get up and confusion. Relayed confusion and attempts to nursing. Will attempt again at later time/date as available/appropriate.)  Janna Arch, PT, DPT   03/23/2019, 12:27 PM

## 2019-03-24 DIAGNOSIS — A0472 Enterocolitis due to Clostridium difficile, not specified as recurrent: Secondary | ICD-10-CM

## 2019-03-24 DIAGNOSIS — G9341 Metabolic encephalopathy: Secondary | ICD-10-CM

## 2019-03-24 LAB — CBC
HCT: 30.3 % — ABNORMAL LOW (ref 36.0–46.0)
Hemoglobin: 10.4 g/dL — ABNORMAL LOW (ref 12.0–15.0)
MCH: 29.6 pg (ref 26.0–34.0)
MCHC: 34.3 g/dL (ref 30.0–36.0)
MCV: 86.3 fL (ref 80.0–100.0)
Platelets: 467 10*3/uL — ABNORMAL HIGH (ref 150–400)
RBC: 3.51 MIL/uL — ABNORMAL LOW (ref 3.87–5.11)
RDW: 13.2 % (ref 11.5–15.5)
WBC: 10.3 10*3/uL (ref 4.0–10.5)
nRBC: 0 % (ref 0.0–0.2)

## 2019-03-24 LAB — BASIC METABOLIC PANEL
Anion gap: 10 (ref 5–15)
BUN: 12 mg/dL (ref 8–23)
CO2: 26 mmol/L (ref 22–32)
Calcium: 8.6 mg/dL — ABNORMAL LOW (ref 8.9–10.3)
Chloride: 99 mmol/L (ref 98–111)
Creatinine, Ser: 1.15 mg/dL — ABNORMAL HIGH (ref 0.44–1.00)
GFR calc Af Amer: 57 mL/min — ABNORMAL LOW (ref >60)
GFR calc non Af Amer: 49 mL/min — ABNORMAL LOW (ref >60)
Glucose, Bld: 210 mg/dL — ABNORMAL HIGH (ref 70–99)
Potassium: 3.5 mmol/L (ref 3.5–5.1)
Sodium: 135 mmol/L (ref 135–145)

## 2019-03-24 LAB — GLUCOSE, CAPILLARY
Glucose-Capillary: 192 mg/dL — ABNORMAL HIGH (ref 70–99)
Glucose-Capillary: 231 mg/dL — ABNORMAL HIGH (ref 70–99)
Glucose-Capillary: 327 mg/dL — ABNORMAL HIGH (ref 70–99)
Glucose-Capillary: 268 mg/dL — ABNORMAL HIGH (ref 70–99)

## 2019-03-24 LAB — MAGNESIUM: Magnesium: 1.7 mg/dL (ref 1.7–2.4)

## 2019-03-24 MED ORDER — METRONIDAZOLE IN NACL 5-0.79 MG/ML-% IV SOLN
500.0000 mg | Freq: Three times a day (TID) | INTRAVENOUS | Status: DC
  Filled 2019-03-24 (×4): qty 100

## 2019-03-24 MED ORDER — ACETAMINOPHEN 325 MG PO TABS: 650.0000 mg | ORAL_TABLET | Freq: Four times a day (QID) | ORAL | Status: DC | PRN

## 2019-03-24 MED ORDER — PREDNISONE 20 MG PO TABS: 20.0000 mg | ORAL_TABLET | Freq: Every day | ORAL | Status: DC

## 2019-03-24 MED ORDER — INSULIN ASPART 100 UNIT/ML ~~LOC~~ SOLN
0.0000 [IU] | Freq: Three times a day (TID) | SUBCUTANEOUS | Status: DC
  Administered 2019-03-24: 4 [IU] via SUBCUTANEOUS
  Administered 2019-03-25 (×3): 2 [IU] via SUBCUTANEOUS
  Filled 2019-03-24 (×4): qty 1

## 2019-03-24 MED ORDER — INSULIN ASPART 100 UNIT/ML ~~LOC~~ SOLN: 0.0000 [IU] | Freq: Three times a day (TID) | SUBCUTANEOUS | Status: DC

## 2019-03-24 MED ORDER — LORAZEPAM 2 MG/ML IJ SOLN
1.0000 mg | Freq: Once | INTRAMUSCULAR | Status: AC
  Administered 2019-03-24: 1 mg via INTRAVENOUS
  Filled 2019-03-24: qty 1

## 2019-03-24 NOTE — Evaluation (Signed)
Physical Therapy Evaluation Patient Details Name: Darlene Stafford MRN: [id number] DOB: August 03, 1950 Today's Date: 03/24/2019   History of Present Illness  Pt is a 69 y.o. female presenting to hospital 03/21/19 with weakness, N/V/diarrhea x10 days, and worsening cough and wheezing.  Pt admitted with sepsis secondary c-diff, asthma exacerbation, DM, and hypokalemia.  PMH includes DM, CVA, sarcoidosis, asthma, UTI, and TKR.  Clinical Impression  Prior to hospital admission, pt was ambulatory with RW and lives with family.  Upon PT arrival to pt's room, pt appearing confused and needing redirection and encouragement throughout session.  Currently pt is SBA semi-supine to sitting edge of bed; CGA with transfers; and CGA to ambulate 30 feet x2 with RW (limited distance ambulating d/t pt declining to walk any further; anticipated distance ambulated limited d/t pt's confusion and difficulty redirecting pt to continue walking--pt appearing that she could continue walking safely otherwise).  Pt would benefit from skilled PT to address noted impairments and functional limitations (see below for any additional details).  Upon hospital discharge, pt would benefit from [place] (pt already has 24/7 assist available at home).    Follow Up Recommendations Home health PT;Supervision/Assistance - 24 hour    Equipment Recommendations  Rolling walker with 5" wheels;3in1 (PT)    Recommendations for Other Services       Precautions / Restrictions Precautions Precautions: Fall Restrictions Weight Bearing Restrictions: No      Mobility  Bed Mobility Overal bed mobility: Needs Assistance Bed Mobility: Supine to Sit;Rolling Rolling: Supervision(use of bed rails; logroll L and R in bed to donn brief)   Supine to sit: Supervision;HOB elevated     General bed mobility comments: mild increased time for pt to perform on own; use of bed rails  Transfers Overall transfer level: Needs assistance Equipment used: Rolling  walker (2 wheeled) Transfers: Sit to/from [facility] Sit to Stand: Min guard Stand pivot transfers: Min guard       General transfer comment: fairly strong stand from bed x1 trial and from recliner x2 trials; stand step turn bed to recliner CGA with RW use  Ambulation/Gait Ambulation/Gait assistance: Min guard Gait Distance (Feet): (30 feet x2) Assistive device: Rolling walker (2 wheeled)       General Gait Details: partial step through gait pattern; decreased cadence but steady  Science writer    Modified Rankin (Stroke Patients Only)       Balance Overall balance assessment: Needs assistance Sitting-balance support: No upper extremity supported;Feet supported Sitting balance-Leahy Scale: Good Sitting balance - Comments: steady sitting reaching within BOS   Standing balance support: No upper extremity supported Standing balance-Leahy Scale: Good Standing balance comment: steady standing reaching within BOS                             Pertinent Vitals/Pain Pain Assessment: Faces Faces Pain Scale: No hurt Pain Intervention(s): Limited activity within patient's tolerance;Monitored during session;Repositioned  Vitals stable and WFL throughout treatment session; O2 on room air.    Home Living Family/patient expects to be discharged to:: Private residence Living Arrangements: Other relatives(Pt's granddaughter and granddaughter's family) Available Help at Discharge: Family;Available 24 hours/day Type of Home: House Home Access: Stairs to enter   [employer] of Steps: 3 small steps to enter Home Layout: Two level;Able to live on main level with bedroom/bathroom Home Equipment: Gilford Rile - 2 wheels;Wheelchair - manual Additional Comments:  Family available 24/7 to assist as needed; pt's cognition intact baseline (but pt tends to be confused in hospital)    Prior Function Level of Independence: Needs  assistance   Gait / Transfers Assistance Needed: Pt ambulates within room by herself but supervision with mobility within rest of home (uses RW); uses w/c to get up/down stairs into/out of home (gets "bumped" up/down stairs)  ADL's / Homemaking Assistance Needed: Assist for bathing, dressing, housekeeping and meal prep  Comments: Above information per CM note and pt's granddaughter (pt handed therapist the phone beginning of session to talk to granddaughter who had called pt)     Hand Dominance        Extremity/Trunk Assessment   Upper Extremity Assessment Upper Extremity Assessment: Generalized weakness    Lower Extremity Assessment Lower Extremity Assessment: Generalized weakness    Cervical / Trunk Assessment Cervical / Trunk Assessment: (forward head/shoulders)  Communication      Cognition Arousal/Alertness: Awake/alert Behavior During Therapy: WFL for tasks assessed/performed Overall Cognitive Status: Impaired/Different from baseline Area of Impairment: Orientation                 Orientation Level: Disoriented to;Place;Time;Situation             General Comments: Pt reporting she was in the cafeteria (multiple redirections required during session to let pt know she was in her hospital room)      General Comments   Nursing cleared pt for participation in physical therapy.  Pt agreeable to PT session.    Exercises     Assessment/Plan    PT Assessment Patient needs continued PT services  PT Problem List Decreased strength;Decreased activity tolerance;Decreased mobility;Decreased cognition       PT Treatment Interventions DME instruction;Gait training;Functional mobility training;Therapeutic activities;Therapeutic exercise;Balance training;Patient/family education    PT Goals (Current goals can be found in the Care Plan section)  Acute Rehab PT Goals Patient Stated Goal: to go home PT Goal Formulation: With patient Time For Goal Achievement:  04/07/19 Potential to Achieve Goals: Good    Frequency Min 2X/week   Barriers to discharge        Co-evaluation               AM-PAC PT "6 Clicks" Mobility  Outcome Measure Help needed turning from your back to your side while in a flat bed without using bedrails?: None Help needed moving from lying on your back to sitting on the side of a flat bed without using bedrails?: A Little Help needed moving to and from a bed to a chair (including a wheelchair)?: A Little Help needed standing up from a chair using your arms (e.g., wheelchair or bedside chair)?: A Little Help needed to walk in hospital room?: A Little Help needed climbing 3-5 steps with a railing? : A Lot 6 Click Score: 18    End of Session Equipment Utilized During Treatment: Gait belt Activity Tolerance: Patient tolerated treatment well Patient left: in chair;with call bell/phone within reach;with chair alarm set;Other (comment)(B heels floating via pillow) Nurse Communication: Mobility status;Precautions PT Visit Diagnosis: Other abnormalities of gait and mobility (R26.89);Muscle weakness (generalized) (M62.81)    Time: [id number] PT Time Calculation (min) (ACUTE ONLY): 46 min   Charges:   PT Evaluation $PT Eval Low Complexity: 1 Low PT Treatments $Therapeutic Activity: 23-37 mins        Joella Saefong, PT 03/24/19, 12:00 PM

## 2019-03-24 NOTE — Progress Notes (Addendum)
PROGRESS NOTE  Darlene Stafford [id number] DOB: 01/26/1951 DOA: 03/21/2019 PCP: Katherina Mires, MD  Brief History   69 year old woman PMH including diabetes mellitus type 2, sarcoidosis, CVA presented with shortness of breath, productive cough, wheezing, watery diarrhea and generalized weakness.  Admitted for acute asthma exacerbation.  Hypokalemia, hypomagnesemia.  Also diagnosed with C. difficile colitis.  A & P  Sepsis secondary to C. difficile colitis present on admission --Sepsis resolved.  Still having ongoing diarrhea.  Continue oral vancomycin and date [id number]  Acute metabolic encephalopathy --Per chart review this has happened in the past per family when patient was hospitalized.  She is pleasant and follows simple commands.  Asthma exacerbation present on admission --This appears resolved at this point.  Steroids may be contributing to confusion.  Will decrease oral prednisone. Continue bronchodilators  Diabetes mellitus type 2, hemoglobin A1c 5.0% --CBG stable. Hyperglycemic. Start SSI.  Hypokalemia, hypomagnesemia --Resolved  Hypothyroidism --Continue levothyroxine  Sarcoidosis --Resume prednisone 2.5 mg daily on completion of taper  DVT prophylaxis: enoxaparin Code Status: Full Family Communication: updated granddaughter by telephone Disposition Plan: Home with home health PT and supervision   Murray Hodgkins, MD  [facility] Direct contact: see [email or web address] (further directions at bottom of note if needed) 7PM-7AM contact night coverage as at bottom of note 03/24/2019, 4:01 PM  LOS: 3 days   Significant Hospital Events   .    Consults:  .    Procedures:  .   Significant Diagnostic Tests:  . SARS-CoV-2 negative . Chest x-ray stable scarring and fibrosis   Micro Data:  .    Antimicrobials:  .   Interval History/Subjective  Feels okay.  Does report ongoing diarrhea.  Breathing okay.  Confused.  Objective   Vitals:  Vitals:   03/24/19 0808  03/24/19 1555  BP: (!) 127/59 135/65  Pulse: 75 76  Resp: 19 19  Temp: 98.6 F (37 C) (!) 97.5 F (36.4 C)  SpO2: 100% 94%    Exam:  Constitutional.  Appears calm, comfortable. Respiratory.  Clear to auscultation bilaterally.  No wheezes, rales or rhonchi.  Normal respiratory effort. Cardiovascular.  Regular rate and rhythm.  No murmur, rub or gallop.  No significant lower extremity edema noted. Grossly normal mood and affect.  Speech fluent and appropriate.  Confused.  Not oriented to location or time.  I have personally reviewed the following:   Today's Data  . Basic metabolic panel unremarkable, creatinine now normal.  Magnesium within normal limits 1.7.  Potassium within normal limits. . Hemoglobin slightly lower at 10.4, remainder CBC unremarkable  Scheduled Meds: . amLODipine  10 mg Oral Daily  . atorvastatin  40 mg Oral q1800  . carvedilol  12.5 mg Oral BID WC  . enoxaparin (LOVENOX) injection  40 mg Subcutaneous Q24H  . ferrous sulfate  325 mg Oral BID WC  . fluticasone  2 spray Each Nare Daily  . fluticasone  1 puff Inhalation Daily  . guaiFENesin  600 mg Oral BID  . ipratropium-albuterol  3 mL Nebulization TID  . levothyroxine  25 mcg Oral Q0600  . loratadine  10 mg Oral Daily  . magnesium oxide  400 mg Oral BID  . montelukast  10 mg Oral QHS  . pantoprazole  40 mg Oral Daily  . potassium chloride SA  20 mEq Oral Daily  . predniSONE  40 mg Oral Q breakfast  . vancomycin  125 mg Oral QID   Continuous Infusions:  Principal Problem:   Clostridioides difficile  infection Active Problems:   Diabetes mellitus without complication ([place])   Sepsis ([place])   Morbid obesity due to excess calories ([place])   Sarcoidosis   Asthma exacerbation   Hypothyroidism   GERD (gastroesophageal reflux disease)   LOS: 3 days   How to contact the [facility] Attending or Consulting provider 7A - 7P or covering provider during after hours [place], for this patient?  1. Check the care team  in [facility] and look for a) attending/consulting TRH provider listed and b) the [facility] team listed 2. Log into [email or web address] and use [place] universal password to access. If you do not have the password, please contact the hospital operator. 3. Locate the [place] provider you are looking for under [facility] and page to a number that you can be directly reached. 4. If you still have difficulty reaching the provider, please page the [facility] (Director on Call) for the Hospitalists listed on amion for assistance.

## 2019-03-24 NOTE — Plan of Care (Signed)

## 2019-03-25 LAB — GI PATHOGEN PANEL BY PCR, STOOL

## 2019-03-25 LAB — GLUCOSE, CAPILLARY
Glucose-Capillary: 216 mg/dL — ABNORMAL HIGH (ref 70–99)
Glucose-Capillary: 213 mg/dL — ABNORMAL HIGH (ref 70–99)

## 2019-03-25 MED ORDER — HALOPERIDOL LACTATE 5 MG/ML IJ SOLN
2.0000 mg | Freq: Four times a day (QID) | INTRAMUSCULAR | Status: DC | PRN
  Administered 2019-03-25: 01:00:00 2 mg via INTRAMUSCULAR
  Filled 2019-03-25: qty 1

## 2019-03-25 MED ORDER — VANCOMYCIN HCL 125 MG PO CAPS: 125.0000 mg | ORAL_CAPSULE | Freq: Four times a day (QID) | ORAL | 0 refills | Status: AC

## 2019-03-25 MED ORDER — VANCOMYCIN 50 MG/ML ORAL SOLUTION
125.0000 mg | Freq: Four times a day (QID) | ORAL | Status: DC
  Filled 2019-03-25 (×4): qty 2.5

## 2019-03-25 NOTE — Care Management Important Message (Signed)
Important Message  Patient Details  Name: Darlene Stafford MRN: [id number] Date of Birth: [birth year]   Medicare Important Message Given:  N/A - LOS <3 / Initial given by admissions     [name] 03/25/2019, 8:06 AM

## 2019-03-25 NOTE — Discharge Summary (Signed)
Physician Discharge Summary  Darlene Stafford [id number] DOB: April 28, 1950 DOA: 03/21/2019  PCP: Katherina Mires, MD  Admit date: 03/21/2019 Discharge date: 03/25/2019  Recommendations for Outpatient Follow-up:  1. Stop PPI if ok  Follow-up Information    Katherina Mires, MD. Schedule an appointment as soon as possible for a visit on 03/30/2019.   Specialty: Family Medicine Why: @ 11:30 am Contact information: [place] [address] [phone]            Discharge Diagnoses: Principal diagnosis is #1 1. Sepsis secondary to C. difficile colitis present on admission 2. Acute metabolic encephalopathy 3. Asthma exacerbation present on admission 4. Diabetes mellitus type 2 5. Hypokalemia, hypomagnesemia 6. Hypothyroidism 7. Sarcoidosis  Discharge Condition: improved Disposition: home  Diet recommendation: diabetic diet  Filed Weights   03/21/19 1809  Weight: 90.7 kg    History of present illness:  69 year old woman PMH including diabetes mellitus type 2, sarcoidosis, CVA presented with shortness of breath, productive cough, wheezing, watery diarrhea and generalized weakness.  Admitted for acute asthma exacerbation.  Hypokalemia, hypomagnesemia.  Also diagnosed with C. difficile colitis.  Hospital Course:  Patient was treated for C. difficile colitis with vancomycin and gradually improved.  On the day of discharge she had only had one stool in the previous day only 2 stools total.  No fever or abdominal pain.  She also has had a mild asthma exacerbation which resolved rapidly does not require any steroid taper.  Hospitalization was complicated by metabolic encephalopathy which per her granddaughter recurrence when the patient was hospitalized.  I discussed in detail with her granddaughter today, patient appears stable for discharge home.  Individual issues as below.  Sepsis secondary to C. difficile colitis present on admission --Sepsis  resolved, patient improved with oral vancomycin. --We will prescribe oral vancomycin on discharge, I discussed with granddaughter alternative pharmacy as their preferred pharmacy did not have the medication.  Acute metabolic encephalopathy --Secondary to acute infection and hospital environment.  Expect spontaneous resolution in her home environment.  Asthma exacerbation present on admission --Appears resolved.  Given her confusion will transition back to her chronic prednisone dose.  I do not think any taper is indicated given mild nature of exacerbation.  Diabetes mellitus type 2, hemoglobin A1c 5.0% --CBG slightly elevated on steroids but expect return to baseline on her normal prednisone dosing.  Hypokalemia, hypomagnesemia --Resolved  Hypothyroidism --Continue levothyroxine  Sarcoidosis --Resume prednisone 2.5 mg daily on completion of taper  Significant Diagnostic Tests:   SARS-CoV-2 negative  Chest x-ray stable scarring and fibrosis  Today's assessment: S: Confused overnight and combative at times, refused oral vancomycin last night. Today quite awake, alert, conversant and calm.  Cooperative.  No complaints.  Per RN only 1 stool today, only 2 stools yesterday.  Thickening out. O: Vitals:  Vitals:   03/24/19 2334 03/25/19 0700  BP: (!) 142/78 (!) 130/46  Pulse: 78 68  Resp: 17 18  Temp: 97.9 F (36.6 C) 97.9 F (36.6 C)  SpO2: 100% 94%    Constitutional:  . Appears calm and comfortable Respiratory:  . CTA bilaterally, no w/r/r.  . Respiratory effort normal.  Cardiovascular:  . RRR, no m/r/g . No LE extremity edema   Abdomen:  . Soft Psychiatric:  . Mental status o Mood, affect appropriate  CBG stable  Discharge Instructions  Discharge Instructions    Discharge instructions   Complete by: As directed    Call your physician or seek immediate  medical attention for fever, diarrhea or worsening of condition.   Increase activity slowly    Complete by: As directed      Allergies as of 03/25/2019      Reactions   Fish Allergy Anaphylaxis   Shellfish Allergy Anaphylaxis   Sulfa Antibiotics Hives, Itching      Medication List    STOP taking these medications   potassium chloride SA 20 MEQ tablet Commonly known as: KLOR-CON     TAKE these medications   albuterol 108 (90 Base) MCG/ACT inhaler Commonly known as: VENTOLIN HFA Inhale 1-2 puffs into the lungs every 4 (four) hours as needed for shortness of breath.   albuterol (2.5 MG/3ML) 0.083% nebulizer solution Commonly known as: PROVENTIL USE 1 VIAL IN NEBULIZER TWICE DAILY   amLODipine 10 MG tablet Commonly known as: NORVASC Take 10 mg by mouth daily.   Arnuity Ellipta 200 MCG/ACT Aepb Generic drug: Fluticasone Furoate Inhale 1 puff into the lungs daily.   atorvastatin 40 MG tablet Commonly known as: LIPITOR Take 40 mg by mouth daily.   carvedilol 12.5 MG tablet Commonly known as: COREG Take 12.5 mg by mouth 2 (two) times daily with a meal.   cetirizine 10 MG tablet Commonly known as: ZYRTEC Take 10 mg by mouth daily as needed.   ferrous sulfate 325 (65 FE) MG tablet Take 1 tablet (325 mg total) by mouth 2 (two) times daily with a meal.   fluticasone 50 MCG/ACT nasal spray Commonly known as: FLONASE Place 2 sprays into both nostrils daily.   guaiFENesin 600 MG 12 hr tablet Commonly known as: MUCINEX Take 1 tablet (600 mg total) by mouth 2 (two) times daily. What changed:   when to take this  reasons to take this   levothyroxine 25 MCG tablet Commonly known as: SYNTHROID Take 1 tablet (25 mcg total) by mouth daily at 6 (six) AM.   magnesium oxide 400 (241.3 Mg) MG tablet Commonly known as: MAG-OX Take 1 tablet (400 mg total) by mouth 2 (two) times daily.   montelukast 10 MG tablet Commonly known as: SINGULAIR Take 1 tablet (10 mg total) by mouth at bedtime.   omeprazole 20 MG capsule Commonly known as: PRILOSEC Take 20 mg by mouth  daily.   predniSONE 5 MG tablet Commonly known as: DELTASONE Take 2.5 mg by mouth daily at 6 (six) AM.   vancomycin 125 MG capsule Commonly known as: VANCOCIN Take 1 capsule (125 mg total) by mouth 4 (four) times daily for 8 days.            Durable Medical Equipment  (From admission, onward)         Start     Ordered   03/25/19 1457  For home use only DME Bedside commode  Once    Question Answer Comment  Patient needs a bedside commode to treat with the following condition Sarcoidosis   Patient needs a bedside commode to treat with the following condition Acute encephalopathy      03/25/19 1456         Allergies  Allergen Reactions  . Fish Allergy Anaphylaxis  . Shellfish Allergy Anaphylaxis  . Sulfa Antibiotics Hives and Itching    The results of significant diagnostics from this hospitalization (including imaging, microbiology, ancillary and laboratory) are listed below for reference.    Significant Diagnostic Studies: DG Chest Portable 1 View  Result Date: 03/21/2019 CLINICAL DATA:  Weakness, nausea, vomiting, diarrhea, febrile, history of sarcoidosis EXAM: PORTABLE CHEST 1  VIEW COMPARISON:  04/15/2018 FINDINGS: Single frontal view of the chest demonstrates an unremarkable cardiac silhouette. Chronic scarring and fibrosis of the right chest unchanged. No new airspace disease, effusion, or pneumothorax. No acute bony abnormalities. IMPRESSION: 1. Stable scarring and fibrosis.  No acute intrathoracic process. Electronically Signed   By: Randa Ngo M.D.   On: 03/21/2019 20:17    Microbiology: Recent Results (from the past 240 hour(s))  Respiratory Panel by RT PCR (Flu A&B, Covid) - Nasopharyngeal Swab     Status: None   Collection Time: 03/21/19  7:59 PM   Specimen: Nasopharyngeal Swab  Result Value Ref Range Status   SARS Coronavirus 2 by RT PCR NEGATIVE NEGATIVE Final    Comment: (NOTE) SARS-CoV-2 target nucleic acids are NOT DETECTED. The SARS-CoV-2 RNA  is generally detectable in upper respiratoy specimens during the acute phase of infection. The lowest concentration of SARS-CoV-2 viral copies this assay can detect is 131 copies/mL. A negative result does not preclude SARS-Cov-2 infection and should not be used as the sole basis for treatment or other patient management decisions. A negative result may occur with  improper specimen collection/handling, submission of specimen other than nasopharyngeal swab, presence of viral mutation(s) within the areas targeted by this assay, and inadequate number of viral copies (<131 copies/mL). A negative result must be combined with clinical observations, patient history, and epidemiological information. The expected result is Negative. Fact Sheet for Patients:  PinkCheek.be Fact Sheet for Healthcare Providers:  GravelBags.it This test is not yet ap proved or cleared by the [place] FDA and  has been authorized for detection and/or diagnosis of SARS-CoV-2 by FDA under an Emergency Use Authorization (EUA). This EUA will remain  in effect (meaning this test can be used) for the duration of the COVID-19 declaration under Section 564(b)(1) of the Act, 21 U.S.C. section 360bbb-3(b)(1), unless the authorization is terminated or revoked sooner.    Influenza A by PCR NEGATIVE NEGATIVE Final   Influenza B by PCR NEGATIVE NEGATIVE Final    Comment: (NOTE) The Xpert Xpress SARS-CoV-2/FLU/RSV assay is intended as an aid in  the diagnosis of influenza from Nasopharyngeal swab specimens and  should not be used as a sole basis for treatment. Nasal washings and  aspirates are unacceptable for Xpert Xpress SARS-CoV-2/FLU/RSV  testing. Fact Sheet for Patients: PinkCheek.be Fact Sheet for Healthcare Providers: GravelBags.it This test is not yet approved or cleared by the [place] FDA and   has been authorized for detection and/or diagnosis of SARS-CoV-2 by  FDA under an Emergency Use Authorization (EUA). This EUA will remain  in effect (meaning this test can be used) for the duration of the  Covid-19 declaration under Section 564(b)(1) of the Act, 21  U.S.C. section 360bbb-3(b)(1), unless the authorization is  terminated or revoked. Performed at [facility], [address] [place], [place] [address]   GI pathogen panel by PCR, stool     Status: None   Collection Time: 03/22/19  1:53 AM   Specimen: Stool  Result Value Ref Range Status   Plesiomonas shigelloides NOT DETECTED NOT DETECTED Final   Yersinia enterocolitica NOT DETECTED NOT DETECTED Final   Vibrio NOT DETECTED NOT DETECTED Final   Enteropathogenic E coli NOT DETECTED NOT DETECTED Final   E coli (ETEC) LT/ST NOT DETECTED NOT DETECTED Final   E coli [id number] by PCR Not applicable NOT DETECTED Final   Cryptosporidium by PCR NOT DETECTED NOT DETECTED Final   Entamoeba histolytica NOT DETECTED NOT  DETECTED Final   Adenovirus F 40/41 NOT DETECTED NOT DETECTED Final   Norovirus GI/GII NOT DETECTED NOT DETECTED Final   Sapovirus NOT DETECTED NOT DETECTED Final    Comment: (NOTE) Performed At: [facility] [place], [place] [id number] Rush Farmer MD [id number]    Vibrio cholerae NOT DETECTED NOT DETECTED Final   Campylobacter by PCR NOT DETECTED NOT DETECTED Final   Salmonella by PCR NOT DETECTED NOT DETECTED Final   E coli (STEC) NOT DETECTED NOT DETECTED Final   Enteroaggregative E coli NOT DETECTED NOT DETECTED Final   Shigella by PCR NOT DETECTED NOT DETECTED Final   Cyclospora cayetanensis NOT DETECTED NOT DETECTED Final   Astrovirus NOT DETECTED NOT DETECTED Final   G lamblia by PCR NOT DETECTED NOT DETECTED Final   Rotavirus A by PCR NOT DETECTED NOT DETECTED Final  C Difficile Quick Screen w PCR reflex     Status: Abnormal   Collection Time: 03/22/19  1:53 AM    Specimen: Stool  Result Value Ref Range Status   C Diff antigen POSITIVE (A) NEGATIVE Final   C Diff toxin NEGATIVE NEGATIVE Final   C Diff interpretation Results are indeterminate. See PCR results.  Final    Comment: Performed at [facility], [address] [place], [place] [address]  C. Diff by PCR, Reflexed     Status: Abnormal   Collection Time: 03/22/19  1:53 AM  Result Value Ref Range Status   Toxigenic C. Difficile by PCR POSITIVE (A) NEGATIVE Final    Comment: Positive for toxigenic C. difficile with little to no toxin production. Only treat if clinical presentation suggests symptomatic illness. Performed at [facility], [address] [place], [place] [address]      Labs: Basic Metabolic Panel: Recent Labs  Lab 03/21/19 1827 03/22/19 0349 03/23/19 0423 03/24/19 0624  NA 135 136 136 135  K 2.9* 3.5 3.8 3.5  CL 90* 95* 98 99  CO2 30 27 25 26   GLUCOSE 129* 156* 195* 210*  BUN 8 10 13 12   CREATININE 1.47* 1.51* 1.28* 1.15*  CALCIUM 9.1 8.9 8.8* 8.6*  MG 0.9* 1.7 1.4* 1.7   Liver Function Tests: Recent Labs  Lab 03/21/19 1959  AST 26  ALT 7  ALKPHOS 49  BILITOT 2.3*  PROT 7.3  ALBUMIN 3.4*   CBC: Recent Labs  Lab 03/21/19 1827 03/22/19 0349 03/23/19 0423 03/24/19 0624  WBC 9.3 6.8 10.1 10.3  HGB 13.2 12.3 11.2* 10.4*  HCT 38.6 37.9 33.6* 30.3*  MCV 86.2 87.9 86.8 86.3  PLT 612* 481* 450* 467*   CBG: Recent Labs  Lab 03/24/19 1150 03/24/19 1645 03/24/19 2102 03/25/19 0900 03/25/19 1142  GLUCAP 231* 327* 268* 216* 213*    Principal Problem:   Clostridioides difficile infection Active Problems:   Diabetes mellitus without complication ([place])   Sepsis ([place])   Morbid obesity due to excess calories ([facility])   Sarcoidosis   Asthma exacerbation   Hypothyroidism   GERD (gastroesophageal reflux disease)   Time coordinating discharge: 35 minutes  Signed:  Murray Hodgkins, MD  [facility]  03/25/2019, 4:31 PM

## 2019-03-25 NOTE — TOC Transition Note (Signed)
Transition of Care [facility]) - CM/SW Discharge Note   Patient Details  Name: Darlene Stafford MRN: [id number] Date of Birth: [birth year]  [facility] CM/SW Contact:  Elease Hashimoto, LCSW Phone Number: 03/25/2019, 2:47 PM   Clinical Narrative:   Spoke with granddaughter via telephone to inform grandmother is being discharged home today. Hopefully pt will be calmer and more oriented at home than she has been here. Granddaughter was aware and they will provide 24 hr care at home. Pt has a rw she used prior to admission. Have ordered a bsc through [employer]. Have tried to set up [place], called [facility],  [facility], [facility], [facility], [employer], [facility], [facility] and [facility]. All without success due to pt's insurance granddaughter is aware of this. She feels once home pt will do better and she will be more cooperative. Hope so she will be in familiar surroundings. Will sign off no other needs.    Final next level of care: [place] Barriers to Discharge: Barriers Resolved   Patient Goals and CMS Choice Patient states their goals for this hospitalization and ongoing recovery are:: Pt confused asked: Where am I? She did not realize she is in the hospital      Discharge Placement                Patient to be transferred to facility by: Granddaughter-Melisha Name of family member notified: Granddaughter Patient and family notified of of transfer: 03/25/19  Discharge Plan and Services In-house Referral: Clinical Social Work              DME Arranged: 3-N-1 DME Agency: [facility] Date DME Agency Contacted: 03/25/19 Time DME Agency Contacted: [phone] Representative spoke with at DME Agency: Brad            Social Determinants of Health ([place]) Interventions     Readmission Risk Interventions No flowsheet data found.

## 2019-03-25 NOTE — Progress Notes (Signed)
Patient very agitated and combative towards staff. Refused oral medication stating " I do not take anything off the streets". Oral vancomycin switched to Flagyl however patient again agitated and pulled out her IV. She is unable to co-operate with staff to take her medications at this time.    Rufina Falco, DNP, CCRN, FNP-C [facility] Nurse Practitioner

## 2019-03-25 NOTE — Progress Notes (Signed)
Results for JA, CARPENTIER (MRN [id number]) as of 03/25/2019 11:33  Ref. Range 03/24/2019 08:39 03/24/2019 11:50 03/24/2019 16:45 03/24/2019 21:02 03/25/2019 09:00  Glucose-Capillary Latest Ref Range: 70 - 99 mg/dL 192 (H) 231 (H) 327 (H) 268 (H) 216 (H)  Noted that CBGs continue to be greater than 180 mg/dl.   Recommend changing Novolog correction scale to SENSITIVE TID if blood sugars continue to be elevated.   Harvel Ricks RN BSN CDE Diabetes Coordinator Pager: [phone]  8am-5pm

## 2019-03-25 NOTE — Progress Notes (Addendum)
Please advise on number of stools today and last 24 hours I stopped Flagyl (not indicated) and restarted oral vanc I don't see need for any IV meds. Can leave IV out. Marland Kitchen Please advise on number of stools today and last 24 hours 1 BM the 17th, 2 on the 18th and 1 this morning. type 4-5 Ok great thanks. I think might go home today. I will see and call grandaugher (pt lives with gdt). I think pt likely to do better at home. Any concerns? Pt is discharged. Oral vancomycin Rx sent to [place]. I spoke to granddaughter

## 2019-03-25 NOTE — Progress Notes (Signed)
1130  Shift report patient went crazy confused last night, she was given Haldol, Ativan. Patient sleeping. patient also pulled PIV out last night at midnight. RRT nurse attempted to restart IV, with 2 people holding her down, this was unsucessful. consulted IV team. Patient will need some of her IV meds switched back to PO.

## 2019-03-28 LAB — GLUCOSE, CAPILLARY: Glucose-Capillary: 231 mg/dL — ABNORMAL HIGH (ref 70–99)

## 2019-04-01 ENCOUNTER — Ambulatory Visit: Payer: Medicare Other | Admitting: Pulmonary Disease

## 2019-04-01 ENCOUNTER — Other Ambulatory Visit: Payer: Self-pay

## 2019-04-01 ENCOUNTER — Encounter: Payer: Self-pay | Admitting: Adult Health

## 2019-04-01 ENCOUNTER — Ambulatory Visit (INDEPENDENT_AMBULATORY_CARE_PROVIDER_SITE_OTHER): Payer: Medicare Other | Admitting: Adult Health

## 2019-04-01 DIAGNOSIS — J4550 Severe persistent asthma, uncomplicated: Secondary | ICD-10-CM

## 2019-04-01 DIAGNOSIS — D869 Sarcoidosis, unspecified: Secondary | ICD-10-CM | POA: Diagnosis not present

## 2019-04-01 MED ORDER — PREDNISONE 5 MG PO TABS: 2.5000 mg | ORAL_TABLET | Freq: Every day | ORAL | 5 refills | Status: DC

## 2019-04-01 NOTE — Addendum Note (Signed)
Addended by: Parke Poisson E on: 04/01/2019 03:41 PM   Modules accepted: Orders

## 2019-04-01 NOTE — Patient Instructions (Signed)
Continue on Prednisone 2.5mg  daily .  Advance activity as tolerated.  Continue with home exercises as tolerated  Continue on Albuterol Nebs Twice daily  .  Follow with Dr. Halford Chessman  In 2- 3 months and As needed   .Please contact office for sooner follow up if symptoms do not improve or worsen or seek emergency care

## 2019-04-01 NOTE — Progress Notes (Signed)
Virtual Visit via Telephone Note  I connected with Darlene Stafford on 04/01/19 at  3:00 PM EST by telephone and verified that I am speaking with the correct person using two identifiers.  Location: Patient:  Home  Provider: Office    I discussed the limitations, risks, security and privacy concerns of performing an evaluation and management service by telephone and the availability of in person appointments. I also discussed with the patient that there may be a patient responsible charge related to this service. The patient expressed understanding and agreed to proceed.   History of Present Illness: 69 year old female followed for sarcoidosis and allergic asthma.  Patient is on chronic steroids prednisone 2.5 mg daily.  Today's televisit is a hospital follow-up.  Patient was hospitalized last week for  Abscess secondary to C. difficile colitis on admission.  Mild asthma flare resolved with nebulizers and steroids.  Patient was discharged back on daily dose of prednisone at 2.5 mg daily.  Patient says since discharge she is feeling better.  Her stomach is improving each day.  She is had decreased diarrhea.  She denies any nausea vomiting.  Says her appetite is low.  She says overall breathing is doing better.  She is not as short of breath.  But remains very weak. Patient says she had tried to slowly taper off prednisone over the last few months but was unable to each time her symptoms of cough shortness of breath flared.  Says she is stayed on 2.5 mg every day  Patient Active Problem List   Diagnosis Date Noted  . 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 Hypothyroidism 03/22/2019  . GERD (gastroesophageal reflux disease) 03/22/2019  . Asthma exacerbation 03/21/2019  . Pressure injury of skin 04/14/2018  . Acute bronchitis 04/30/2017  . Hemoptysis 04/30/2017  . Sarcoidosis 02/03/2017  . Morbid obesity due to excess calories ([place])   . CAP (community acquired pneumonia) 04/10/2015  .  Diabetes mellitus without complication ([place]) [id number]  . Asthma 04/10/2015  . Acute encephalopathy 04/10/2015  . UTI (lower urinary tract infection) 04/10/2015  . AKI (acute kidney injury) ([place]) 04/10/2015  . Sepsis ([place]) 04/10/2015  . Hypotension 04/10/2015   Current Outpatient Medications on File Prior to Visit  Medication Sig Dispense Refill  . albuterol (PROVENTIL) (2.5 MG/3ML) 0.083% nebulizer solution USE 1 VIAL IN NEBULIZER TWICE DAILY 450 mL 1  . albuterol (VENTOLIN HFA) 108 (90 Base) MCG/ACT inhaler Inhale 1-2 puffs into the lungs every 4 (four) hours as needed for shortness of breath. 1 Inhaler 5  . amLODipine (NORVASC) 10 MG tablet Take 10 mg by mouth daily.    Marland Kitchen atorvastatin (LIPITOR) 40 MG tablet Take 40 mg by mouth daily.    . carvedilol (COREG) 12.5 MG tablet Take 12.5 mg by mouth 2 (two) times daily with a meal.    . cetirizine (ZYRTEC) 10 MG tablet Take 10 mg by mouth daily as needed.    . ferrous sulfate 325 (65 FE) MG tablet Take 1 tablet (325 mg total) by mouth 2 (two) times daily with a meal. 30 tablet 0  . fluticasone (FLONASE) 50 MCG/ACT nasal spray Place 2 sprays into both nostrils daily.    . Fluticasone Furoate (ARNUITY ELLIPTA) 200 MCG/ACT AEPB Inhale 1 puff into the lungs daily. 30 each 5  . guaiFENesin (MUCINEX) 600 MG 12 hr tablet Take 1 tablet (600 mg total) by mouth 2 (two) times daily. (Patient taking differently: Take 600 mg by mouth as needed for to loosen phlegm. )  40 tablet 0  . levothyroxine (SYNTHROID, LEVOTHROID) 25 MCG tablet Take 1 tablet (25 mcg total) by mouth daily at 6 (six) AM. 30 tablet 0  . magnesium oxide (MAG-OX) 400 (241.3 Mg) MG tablet Take 1 tablet (400 mg total) by mouth 2 (two) times daily. 30 tablet 0  . montelukast (SINGULAIR) 10 MG tablet Take 1 tablet (10 mg total) by mouth at bedtime. 30 tablet 5  . omeprazole (PRILOSEC) 20 MG capsule Take 20 mg by mouth daily.    . predniSONE (DELTASONE) 5 MG tablet Take 2.5 mg by mouth daily at  6 (six) AM.    . vancomycin (VANCOCIN) 125 MG capsule Take 1 capsule (125 mg total) by mouth 4 (four) times daily for 8 days. 32 capsule 0   No current facility-administered medications on file prior to visit.    Observations/Objective: Speaks in full sentences with no audible distress  Assessment and Plan: Sarcoidosis-currently stable.  As of right now will stay on low-dose prednisone at 2.5 mg daily  Asthma-recent flare during critical illness.  Patient is improved and back to baseline.  Continue on current regimen.  Plan  Patient Instructions  Continue on Prednisone 2.5mg  daily .  Advance activity as tolerated.  Continue with home exercises as tolerated  Continue on Albuterol Nebs Twice daily  .  Follow with Dr. Halford Chessman  In 2- 3 months and As needed   .Please contact office for sooner follow up if symptoms do not improve or worsen or seek emergency care    '  Follow Up Instructions: Follow up in 2-3 months and As needed      I discussed the assessment and treatment plan with the patient. The patient was provided an opportunity to ask questions and all were answered. The patient agreed with the plan and demonstrated an understanding of the instructions.   The patient was advised to call back or seek an in-person evaluation if the symptoms worsen or if the condition fails to improve as anticipated.  I provided 22  minutes of non-face-to-face time during this encounter.   Rexene Edison, NP

## 2019-04-04 NOTE — Progress Notes (Signed)
Reviewed and agree with assessment/plan.   Kalyn Dimattia, MD [facility] 01/30/2016, 12:24 PM Pager:  [phone]  

## 2019-05-24 ENCOUNTER — Other Ambulatory Visit: Payer: Self-pay

## 2019-05-24 ENCOUNTER — Encounter: Payer: Self-pay | Admitting: Pulmonary Disease

## 2019-05-24 ENCOUNTER — Ambulatory Visit (INDEPENDENT_AMBULATORY_CARE_PROVIDER_SITE_OTHER): Payer: Medicare Other | Admitting: Pulmonary Disease

## 2019-05-24 DIAGNOSIS — R05 Cough: Secondary | ICD-10-CM

## 2019-05-24 DIAGNOSIS — D869 Sarcoidosis, unspecified: Secondary | ICD-10-CM

## 2019-05-24 DIAGNOSIS — J4550 Severe persistent asthma, uncomplicated: Secondary | ICD-10-CM | POA: Diagnosis not present

## 2019-05-24 DIAGNOSIS — R058 Other specified cough: Secondary | ICD-10-CM

## 2019-05-24 NOTE — Progress Notes (Signed)
[facility]  Chief Complaint  Patient presents with  . Televisit    Called and spoke with pt who stated she has been okay since last visit. Pt has an occ cough due to asthma and states she is coughing up clear phlegm. Pt was put on zyrtec by PCP which she states has helped. Pt states that she also has occ wheezing and will occ have chest tightness due to her asthma. Pt states her SOB is about the same.    Constitutional:  There were no vitals taken for this visit.  Deferred  Past Medical History:  DM, CVA, CKD 3, Gout  Brief Summary:  Darlene Stafford is a 69 y.o. female with sarcoidosis on chronic prednisone, allergic asthma, and upper airway cough syndrome.  Subjective:  Virtual Visit via Telephone Note  I connected with Darlene Stafford on 05/24/19 at 10:15 AM EDT by telephone and verified that I am speaking with the correct person using two identifiers.  Location: Patient: home Provider: medical office   I discussed the limitations, risks, security and privacy concerns of performing an evaluation and management service by telephone and the availability of in person appointments. I also discussed with the patient that there may be a patient responsible charge related to this service. The patient expressed understanding and agreed to proceed.  She has been doing okay recently.  Has occasional cough with clear sputum and wheeze.  No fever, chest pain, chills, hemoptysis.  Keeps up with her exercise regimen at home.  Still on prednisone 2.5 mg daily.   Physical Exam:  Deferred.  Assessment/Plan:   Systemic sarcoidosis. - continue 2.5 mg prednisone daily for now - will discuss at next visit if she feels stable enough to continue weaning off prednisone  Allergic asthma with elevated IgE. - continue arnuity, singulair, and prn albuterol  Upper airway cough syndrome with post nasal drip. - continue flonase, zyrtec, singulair   I discussed  the assessment and treatment plan with the patient. The patient was provided an opportunity to ask questions and all were answered. The patient agreed with the plan and demonstrated an understanding of the instructions.   The patient was advised to call back or seek an in-person evaluation if the symptoms worsen or if the condition fails to improve as anticipated.  I provided 16 minutes of non-face-to-face time during this encounter.   Follow up:   Patient Instructions  Follow up in 4 months     Signature:  Chesley Mires, MD [place] Pager: [phone] 05/24/2019, 10:38 AM  Flow Sheet     Pulmonary tests:  RAST 09/30/16 >> multiple allergens, IgE 3120 PFT 11/10/16 >> FEV1 1.13 (73%), FEV1% 92, TLC 3.12 (71%), DLCO 76%, no BD  Chest imaging:  HRCT chest 11/03/16 >> patchy septal thickening, honeycombing, and bronchiectasis CT chest 04/12/18 >> atherosclerosis, calcified mediastinal LN, honeycombing RUL, patchy LUL ASD, honeycombing posterior Lt lung, calcified granuloma in spleen, renal stones  Cardiac testing:  Echo 03/20/18 >> EF greater than 65%, normal RV systolic function  Medications:   Allergies as of 05/24/2019      Reactions   Fish Allergy Anaphylaxis   Shellfish Allergy Anaphylaxis   Sulfa Antibiotics Hives, Itching      Medication List       Accurate as of May 24, 2019 10:38 AM. If you have any questions, ask your nurse or doctor.        albuterol 108 (90 Base) MCG/ACT inhaler Commonly  known as: VENTOLIN HFA Inhale 1-2 puffs into the lungs every 4 (four) hours as needed for shortness of breath.   albuterol (2.5 MG/3ML) 0.083% nebulizer solution Commonly known as: PROVENTIL USE 1 VIAL IN NEBULIZER TWICE DAILY   amLODipine 10 MG tablet Commonly known as: NORVASC Take 10 mg by mouth daily.   Arnuity Ellipta 200 MCG/ACT Aepb Generic drug: Fluticasone Furoate Inhale 1 puff into the lungs daily.   atorvastatin 40 MG  tablet Commonly known as: LIPITOR Take 40 mg by mouth daily.   carvedilol 12.5 MG tablet Commonly known as: COREG Take 12.5 mg by mouth 2 (two) times daily with a meal.   cetirizine 10 MG tablet Commonly known as: ZYRTEC Take 10 mg by mouth daily as needed.   ferrous sulfate 325 (65 FE) MG tablet Take 1 tablet (325 mg total) by mouth 2 (two) times daily with a meal.   fluticasone 50 MCG/ACT nasal spray Commonly known as: FLONASE Place 2 sprays into both nostrils daily.   guaiFENesin 600 MG 12 hr tablet Commonly known as: MUCINEX Take 1 tablet (600 mg total) by mouth 2 (two) times daily. What changed:   when to take this  reasons to take this   levothyroxine 25 MCG tablet Commonly known as: SYNTHROID Take 1 tablet (25 mcg total) by mouth daily at 6 (six) AM.   magnesium oxide 400 (241.3 Mg) MG tablet Commonly known as: MAG-OX Take 1 tablet (400 mg total) by mouth 2 (two) times daily.   montelukast 10 MG tablet Commonly known as: SINGULAIR Take 1 tablet (10 mg total) by mouth at bedtime.   omeprazole 20 MG capsule Commonly known as: PRILOSEC Take 20 mg by mouth daily.   predniSONE 5 MG tablet Commonly known as: DELTASONE Take 0.5 tablets (2.5 mg total) by mouth daily at 6 (six) AM.       Past Surgical History:  She  has a past surgical history that includes Replacement total knee (2015) and Partial hysterectomy (1998).  Family History:  Her family history includes Breast cancer in her sister; Sarcoidosis in her sister, sister, and sister.  Social History:  She  reports that she has never smoked. She has never used smokeless tobacco. She reports that she does not drink alcohol or use drugs.

## 2019-05-24 NOTE — Patient Instructions (Signed)
Follow up in 4 months 

## 2019-06-21 ENCOUNTER — Other Ambulatory Visit: Payer: Self-pay | Admitting: Pulmonary Disease

## 2019-06-22 ENCOUNTER — Other Ambulatory Visit: Payer: Self-pay | Admitting: *Deleted

## 2019-06-22 MED ORDER — PREDNISONE 5 MG PO TABS: 2.5000 mg | ORAL_TABLET | Freq: Every day | ORAL | 5 refills | Status: DC

## 2019-07-25 ENCOUNTER — Telehealth: Payer: Self-pay | Admitting: Pulmonary Disease

## 2019-07-25 ENCOUNTER — Other Ambulatory Visit: Payer: Self-pay | Admitting: Adult Health

## 2019-07-25 NOTE — Telephone Encounter (Signed)
ATC pt, line rang twice then went to a busy signal x2. Will try back.

## 2019-07-26 NOTE — Telephone Encounter (Signed)
ATC pt, line rang twice then went to a busy signal x2. Will try back.

## 2019-07-27 NOTE — Telephone Encounter (Signed)
ATC pt, line rang twice then went to a busy signal x2. We have attempted to contact pt several times with no success or call back from pt. Per triage protocol, message will be closed.

## 2019-10-21 ENCOUNTER — Telehealth: Payer: Self-pay | Admitting: Pulmonary Disease

## 2019-10-21 NOTE — Telephone Encounter (Signed)
Spoke with patient regarding prior message.Made patient a f/u with Dr.Sood for 12/20/19 at 11:30. Patient is aware nothing else further needed.

## 2019-10-24 ENCOUNTER — Other Ambulatory Visit: Payer: Self-pay | Admitting: Pulmonary Disease

## 2019-11-27 ENCOUNTER — Inpatient Hospital Stay
Admission: EM | Admit: 2019-11-27 | Discharge: 2019-11-30 | DRG: 871 | Disposition: A | Discharge status: Home / Self Care | Payer: Medicare Other | Attending: Internal Medicine | Admitting: Internal Medicine

## 2019-11-27 ENCOUNTER — Encounter: Payer: Self-pay | Admitting: Internal Medicine

## 2019-11-27 ENCOUNTER — Emergency Department: Payer: Medicare Other

## 2019-11-27 ENCOUNTER — Other Ambulatory Visit: Payer: Self-pay

## 2019-11-27 DIAGNOSIS — G9341 Metabolic encephalopathy: Secondary | ICD-10-CM | POA: Diagnosis present

## 2019-11-27 DIAGNOSIS — N1831 Chronic kidney disease, stage 3a: Secondary | ICD-10-CM | POA: Diagnosis present

## 2019-11-27 DIAGNOSIS — A084 Viral intestinal infection, unspecified: Secondary | ICD-10-CM | POA: Diagnosis not present

## 2019-11-27 DIAGNOSIS — Z825 Family history of asthma and other chronic lower respiratory diseases: Secondary | ICD-10-CM | POA: Diagnosis not present

## 2019-11-27 DIAGNOSIS — J45901 Unspecified asthma with (acute) exacerbation: Secondary | ICD-10-CM | POA: Diagnosis not present

## 2019-11-27 DIAGNOSIS — Z96659 Presence of unspecified artificial knee joint: Secondary | ICD-10-CM | POA: Diagnosis present

## 2019-11-27 DIAGNOSIS — N179 Acute kidney failure, unspecified: Secondary | ICD-10-CM | POA: Diagnosis not present

## 2019-11-27 DIAGNOSIS — E1122 Type 2 diabetes mellitus with diabetic chronic kidney disease: Secondary | ICD-10-CM | POA: Diagnosis present

## 2019-11-27 DIAGNOSIS — M6282 Rhabdomyolysis: Secondary | ICD-10-CM | POA: Diagnosis present

## 2019-11-27 DIAGNOSIS — R6520 Severe sepsis without septic shock: Secondary | ICD-10-CM | POA: Diagnosis present

## 2019-11-27 DIAGNOSIS — Z882 Allergy status to sulfonamides status: Secondary | ICD-10-CM

## 2019-11-27 DIAGNOSIS — Z90711 Acquired absence of uterus with remaining cervical stump: Secondary | ICD-10-CM

## 2019-11-27 DIAGNOSIS — Z91013 Allergy to seafood: Secondary | ICD-10-CM | POA: Diagnosis not present

## 2019-11-27 DIAGNOSIS — E039 Hypothyroidism, unspecified: Secondary | ICD-10-CM | POA: Diagnosis present

## 2019-11-27 DIAGNOSIS — E11649 Type 2 diabetes mellitus with hypoglycemia without coma: Secondary | ICD-10-CM | POA: Diagnosis not present

## 2019-11-27 DIAGNOSIS — N170 Acute kidney failure with tubular necrosis: Secondary | ICD-10-CM | POA: Diagnosis present

## 2019-11-27 DIAGNOSIS — E119 Type 2 diabetes mellitus without complications: Secondary | ICD-10-CM

## 2019-11-27 DIAGNOSIS — D869 Sarcoidosis, unspecified: Secondary | ICD-10-CM | POA: Diagnosis present

## 2019-11-27 DIAGNOSIS — A419 Sepsis, unspecified organism: Secondary | ICD-10-CM | POA: Diagnosis present

## 2019-11-27 DIAGNOSIS — Z87440 Personal history of urinary (tract) infections: Secondary | ICD-10-CM

## 2019-11-27 DIAGNOSIS — Z6841 Body Mass Index (BMI) 40.0 and over, adult: Secondary | ICD-10-CM

## 2019-11-27 DIAGNOSIS — J189 Pneumonia, unspecified organism: Secondary | ICD-10-CM | POA: Diagnosis present

## 2019-11-27 DIAGNOSIS — J45909 Unspecified asthma, uncomplicated: Secondary | ICD-10-CM | POA: Diagnosis present

## 2019-11-27 DIAGNOSIS — R509 Fever, unspecified: Secondary | ICD-10-CM | POA: Diagnosis present

## 2019-11-27 DIAGNOSIS — J4540 Moderate persistent asthma, uncomplicated: Secondary | ICD-10-CM | POA: Diagnosis present

## 2019-11-27 DIAGNOSIS — Z79899 Other long term (current) drug therapy: Secondary | ICD-10-CM

## 2019-11-27 DIAGNOSIS — R531 Weakness: Secondary | ICD-10-CM | POA: Diagnosis not present

## 2019-11-27 DIAGNOSIS — Z8673 Personal history of transient ischemic attack (TIA), and cerebral infarction without residual deficits: Secondary | ICD-10-CM

## 2019-11-27 DIAGNOSIS — R410 Disorientation, unspecified: Secondary | ICD-10-CM | POA: Diagnosis not present

## 2019-11-27 DIAGNOSIS — Z7952 Long term (current) use of systemic steroids: Secondary | ICD-10-CM

## 2019-11-27 DIAGNOSIS — Z20822 Contact with and (suspected) exposure to covid-19: Secondary | ICD-10-CM | POA: Diagnosis present

## 2019-11-27 DIAGNOSIS — Z79890 Hormone replacement therapy (postmenopausal): Secondary | ICD-10-CM

## 2019-11-27 DIAGNOSIS — Z7951 Long term (current) use of inhaled steroids: Secondary | ICD-10-CM

## 2019-11-27 LAB — MAGNESIUM: Magnesium: 1.5 mg/dL — ABNORMAL LOW (ref 1.7–2.4)

## 2019-11-27 LAB — CBC WITH DIFFERENTIAL/PLATELET
Abs Immature Granulocytes: 0.15 10*3/uL — ABNORMAL HIGH (ref 0.00–0.07)
Basophils Absolute: 0.1 10*3/uL (ref 0.0–0.1)
Basophils Relative: 1 %
Eosinophils Absolute: 0.4 10*3/uL (ref 0.0–0.5)
Eosinophils Relative: 3 %
HCT: 40.2 % (ref 36.0–46.0)
Hemoglobin: 13.8 g/dL (ref 12.0–15.0)
Immature Granulocytes: 1 %
Lymphocytes Relative: 27 %
Lymphs Abs: 3.1 10*3/uL (ref 0.7–4.0)
MCH: 30.6 pg (ref 26.0–34.0)
MCHC: 34.3 g/dL (ref 30.0–36.0)
MCV: 89.1 fL (ref 80.0–100.0)
Monocytes Absolute: 1.2 10*3/uL — ABNORMAL HIGH (ref 0.1–1.0)
Monocytes Relative: 11 %
Neutro Abs: 6.6 10*3/uL (ref 1.7–7.7)
Neutrophils Relative %: 57 %
Platelets: 399 10*3/uL (ref 150–400)
RBC: 4.51 MIL/uL (ref 3.87–5.11)
RDW: 12.1 % (ref 11.5–15.5)
WBC: 11.5 10*3/uL — ABNORMAL HIGH (ref 4.0–10.5)
nRBC: 0.2 % (ref 0.0–0.2)

## 2019-11-27 LAB — URINALYSIS, COMPLETE (UACMP) WITH MICROSCOPIC
Bacteria, UA: NONE SEEN
Bilirubin Urine: NEGATIVE
Glucose, UA: NEGATIVE mg/dL
Hgb urine dipstick: NEGATIVE
Ketones, ur: NEGATIVE mg/dL
Leukocytes,Ua: NEGATIVE
Nitrite: NEGATIVE
Protein, ur: NEGATIVE mg/dL
Specific Gravity, Urine: 1.014 (ref 1.005–1.030)
pH: 5 (ref 5.0–8.0)

## 2019-11-27 LAB — URINE DRUG SCREEN, QUALITATIVE (ARMC ONLY)
Amphetamines, Ur Screen: NOT DETECTED
Barbiturates, Ur Screen: NOT DETECTED
Benzodiazepine, Ur Scrn: NOT DETECTED
Cannabinoid 50 Ng, Ur ~~LOC~~: NOT DETECTED
Cocaine Metabolite,Ur ~~LOC~~: NOT DETECTED
MDMA (Ecstasy)Ur Screen: NOT DETECTED
Methadone Scn, Ur: NOT DETECTED
Opiate, Ur Screen: NOT DETECTED
Phencyclidine (PCP) Ur S: NOT DETECTED
Tricyclic, Ur Screen: NOT DETECTED

## 2019-11-27 LAB — COMPREHENSIVE METABOLIC PANEL
ALT: 11 U/L (ref 0–44)
AST: 22 U/L (ref 15–41)
Albumin: 4 g/dL (ref 3.5–5.0)
Alkaline Phosphatase: 80 U/L (ref 38–126)
Anion gap: 8 (ref 5–15)
BUN: 9 mg/dL (ref 8–23)
CO2: 29 mmol/L (ref 22–32)
Calcium: 9 mg/dL (ref 8.9–10.3)
Chloride: 101 mmol/L (ref 98–111)
Creatinine, Ser: 1.33 mg/dL — ABNORMAL HIGH (ref 0.44–1.00)
GFR, Estimated: 43 mL/min — ABNORMAL LOW (ref >60)
Glucose, Bld: 120 mg/dL — ABNORMAL HIGH (ref 70–99)
Potassium: 3.7 mmol/L (ref 3.5–5.1)
Sodium: 138 mmol/L (ref 135–145)
Total Bilirubin: 1.1 mg/dL (ref 0.3–1.2)
Total Protein: 7.5 g/dL (ref 6.5–8.1)

## 2019-11-27 LAB — GLUCOSE, CAPILLARY
Glucose-Capillary: 88 mg/dL (ref 70–99)
Glucose-Capillary: 58 mg/dL — ABNORMAL LOW (ref 70–99)
Glucose-Capillary: 73 mg/dL (ref 70–99)
Glucose-Capillary: 76 mg/dL (ref 70–99)

## 2019-11-27 LAB — RESPIRATORY PANEL BY RT PCR (FLU A&B, COVID)
Influenza A by PCR: NEGATIVE
Influenza B by PCR: NEGATIVE
SARS Coronavirus 2 by RT PCR: NEGATIVE

## 2019-11-27 LAB — LACTIC ACID, PLASMA: Lactic Acid, Venous: 1.4 mmol/L (ref 0.5–1.9)

## 2019-11-27 LAB — APTT: aPTT: 42 seconds — ABNORMAL HIGH (ref 24–36)

## 2019-11-27 LAB — PROCALCITONIN: Procalcitonin: <0.1 ng/mL

## 2019-11-27 LAB — PROTIME-INR
INR: 1 (ref 0.8–1.2)
Prothrombin Time: 13.2 seconds (ref 11.4–15.2)

## 2019-11-27 MED ORDER — LACTATED RINGERS IV SOLN: INTRAVENOUS | Status: DC

## 2019-11-27 MED ORDER — ALBUTEROL SULFATE (2.5 MG/3ML) 0.083% IN NEBU
2.5000 mg | INHALATION_SOLUTION | RESPIRATORY_TRACT | Status: DC | PRN
  Administered 2019-11-28: 2.5 mg via RESPIRATORY_TRACT
  Filled 2019-11-27: qty 3

## 2019-11-27 MED ORDER — PREDNISONE 2.5 MG PO TABS
2.5000 mg | ORAL_TABLET | Freq: Every day | ORAL | Status: DC
  Administered 2019-11-29: 2.5 mg via ORAL
  Filled 2019-11-27 (×3): qty 1

## 2019-11-27 MED ORDER — DEXTROSE IN LACTATED RINGERS 5 % IV SOLN: INTRAVENOUS | Status: DC

## 2019-11-27 MED ORDER — FLUTICASONE FUROATE 200 MCG/ACT IN AEPB: 1.0000 | INHALATION_SPRAY | Freq: Every day | RESPIRATORY_TRACT | Status: DC

## 2019-11-27 MED ORDER — MONTELUKAST SODIUM 10 MG PO TABS
10.0000 mg | ORAL_TABLET | Freq: Every day | ORAL | Status: DC
  Administered 2019-11-27 – 2019-11-29 (×3): 10 mg via ORAL
  Filled 2019-11-27 (×3): qty 1

## 2019-11-27 MED ORDER — ACETAMINOPHEN 325 MG PO TABS
650.0000 mg | ORAL_TABLET | Freq: Four times a day (QID) | ORAL | Status: DC | PRN
  Administered 2019-11-28 – 2019-11-29 (×4): 650 mg via ORAL
  Filled 2019-11-27 (×4): qty 2

## 2019-11-27 MED ORDER — INSULIN ASPART 100 UNIT/ML ~~LOC~~ SOLN
0.0000 [IU] | SUBCUTANEOUS | Status: DC
  Filled 2019-11-27: qty 1

## 2019-11-27 MED ORDER — INSULIN ASPART 100 UNIT/ML ~~LOC~~ SOLN: 0.0000 [IU] | Freq: Three times a day (TID) | SUBCUTANEOUS | Status: DC

## 2019-11-27 MED ORDER — SODIUM CHLORIDE 0.9 % IV SOLN
500.0000 mg | INTRAVENOUS | Status: DC
  Administered 2019-11-28 – 2019-11-29 (×2): 500 mg via INTRAVENOUS
  Filled 2019-11-27 (×3): qty 500

## 2019-11-27 MED ORDER — MAGNESIUM SULFATE 2 GM/50ML IV SOLN
2.0000 g | Freq: Once | INTRAVENOUS | Status: AC
  Administered 2019-11-27: 2 g via INTRAVENOUS
  Filled 2019-11-27: qty 50

## 2019-11-27 MED ORDER — LEVOTHYROXINE SODIUM 25 MCG PO TABS
25.0000 ug | ORAL_TABLET | Freq: Every day | ORAL | Status: DC
  Administered 2019-11-29 – 2019-11-30 (×2): 25 ug via ORAL
  Filled 2019-11-27 (×2): qty 1

## 2019-11-27 MED ORDER — FLUTICASONE PROPIONATE 50 MCG/ACT NA SUSP
2.0000 | Freq: Every day | NASAL | Status: DC
  Administered 2019-11-28 – 2019-11-29 (×2): 2 via NASAL
  Filled 2019-11-27 (×2): qty 16

## 2019-11-27 MED ORDER — SODIUM CHLORIDE 0.9 % IV SOLN
1.0000 g | INTRAVENOUS | Status: DC
  Administered 2019-11-28 – 2019-11-29 (×2): 1 g via INTRAVENOUS
  Filled 2019-11-27: qty 1
  Filled 2019-11-27 (×2): qty 10

## 2019-11-27 MED ORDER — ENOXAPARIN SODIUM 40 MG/0.4ML ~~LOC~~ SOLN
40.0000 mg | SUBCUTANEOUS | Status: DC
  Administered 2019-11-28 – 2019-11-30 (×3): 40 mg via SUBCUTANEOUS
  Filled 2019-11-27 (×3): qty 0.4

## 2019-11-27 MED ORDER — SODIUM CHLORIDE 0.9 % IV BOLUS (SEPSIS)
1000.0000 mL | Freq: Once | INTRAVENOUS | Status: AC
  Administered 2019-11-27: 1000 mL via INTRAVENOUS

## 2019-11-27 MED ORDER — SODIUM CHLORIDE 0.9 % IV SOLN
1.0000 g | Freq: Once | INTRAVENOUS | Status: AC
  Administered 2019-11-27: 1 g via INTRAVENOUS
  Filled 2019-11-27: qty 10

## 2019-11-27 MED ORDER — ACETAMINOPHEN 650 MG RE SUPP: 650.0000 mg | Freq: Four times a day (QID) | RECTAL | Status: DC | PRN

## 2019-11-27 MED ORDER — SODIUM CHLORIDE 0.9 % IV SOLN
500.0000 mg | Freq: Once | INTRAVENOUS | Status: AC
  Administered 2019-11-27: 500 mg via INTRAVENOUS
  Filled 2019-11-27: qty 500

## 2019-11-27 NOTE — Consult Note (Signed)
CODE SEPSIS - PHARMACY COMMUNICATION  **Broad Spectrum Antibiotics should be administered within 1 hour of Sepsis diagnosis**  Time Code Sepsis Called/Page Received: 1300  Antibiotics Ordered: ceftriaxone and azithromycin  Time of 1st antibiotic administration: 1405  Additional action taken by pharmacy: Called nurse @ 1330 - no answer, called again at 1340 spoke to Marcello Fennel, PharmD Pharmacy Resident  11/27/2019 1:06 PM

## 2019-11-27 NOTE — ED Notes (Signed)
Lab drew second set of blood cultures

## 2019-11-27 NOTE — ED Notes (Addendum)
Per Dr. Corky Downs ok to start antibiotics without second set of blood cultures. Multiple attempts made to contact lab to come draw second set of cultures, no answer. Agricultural consultant notified.

## 2019-11-27 NOTE — ED Provider Notes (Signed)
[facility] Emergency Department Provider Note   ____________________________________________    I have reviewed the triage vital signs and the nursing notes.   HISTORY  Chief Complaint Weakness  History limited by confusion   HPI Darlene Stafford is a 69 y.o. female who presents with altered mental status.  Family reports increased weakness and confusion that started yesterday.  They are concerned that she may have a urinary tract infection as this is similar presentation to when she has had urinary tract infections in the past.  Patient complains primarily of fatigue at this time  Past Medical History:  Diagnosis Date  . Asthma   . CVA (cerebral vascular accident) ([place])   . Diabetes mellitus without complication ([place])   . Sarcoidosis of other sites    Ocular    Patient Active Problem List   Diagnosis Date Noted  . 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 Hypothyroidism 03/22/2019  . GERD (gastroesophageal reflux disease) 03/22/2019  . Asthma exacerbation 03/21/2019  . Pressure injury of skin 04/14/2018  . Acute bronchitis 04/30/2017  . Hemoptysis 04/30/2017  . Sarcoidosis 02/03/2017  . Morbid obesity due to excess calories ([place])   . CAP (community acquired pneumonia) 04/10/2015  . Diabetes mellitus without complication ([place]) 49/20/1007  . Asthma 04/10/2015  . Acute encephalopathy 04/10/2015  . UTI (lower urinary tract infection) 04/10/2015  . AKI (acute kidney injury) ([place]) 04/10/2015  . Sepsis ([place]) 04/10/2015  . Hypotension 04/10/2015    Past Surgical History:  Procedure Laterality Date  . PARTIAL HYSTERECTOMY  1998  . REPLACEMENT TOTAL KNEE  2015    Prior to Admission medications   Medication Sig Start Date End Date Taking? Authorizing Provider  albuterol (PROVENTIL) (2.5 MG/3ML) 0.083% nebulizer solution USE 1 VIAL IN NEBULIZER TWICE DAILY 10/24/19   Chesley Mires, MD  albuterol (VENTOLIN HFA) 108 (90 Base) MCG/ACT  inhaler INHALE 1 TO 2 PUFFS BY MOUTH EVERY 4 HOURS AS NEEDED FOR SHORTNESS OF BREATH 07/25/19   Parrett, Tammy S, NP  amLODipine (NORVASC) 10 MG tablet Take 10 mg by mouth daily. 12/20/18   [provider]  atorvastatin (LIPITOR) 40 MG tablet Take 40 mg by mouth daily.    [provider]  carvedilol (COREG) 12.5 MG tablet Take 12.5 mg by mouth 2 (two) times daily with a meal.    [provider]  cetirizine (ZYRTEC) 10 MG tablet Take 10 mg by mouth daily as needed. 12/17/18   [provider]  ferrous sulfate 325 (65 FE) MG tablet Take 1 tablet (325 mg total) by mouth 2 (two) times daily with a meal. 04/17/18   Regalado, Belkys A, MD  fluticasone (FLONASE) 50 MCG/ACT nasal spray Place 2 sprays into both nostrils daily. 12/20/18   [provider]  Fluticasone Furoate (ARNUITY ELLIPTA) 200 MCG/ACT AEPB Inhale 1 puff into the lungs daily. 01/17/19   Chesley Mires, MD  guaiFENesin (MUCINEX) 600 MG 12 hr tablet Take 1 tablet (600 mg total) by mouth 2 (two) times daily. Patient taking differently: Take 600 mg by mouth as needed for to loosen phlegm.  04/14/15   Barton Dubois, MD  levothyroxine (SYNTHROID, LEVOTHROID) 25 MCG tablet Take 1 tablet (25 mcg total) by mouth daily at 6 (six) AM. 04/18/18   Regalado, Belkys A, MD  magnesium oxide (MAG-OX) 400 (241.3 Mg) MG tablet Take 1 tablet (400 mg total) by mouth 2 (two) times daily. 04/20/18   Regalado, Belkys A, MD  montelukast (SINGULAIR) 10 MG tablet  Take 1 tablet (10 mg total) by mouth at bedtime. 08/22/16   Chesley Mires, MD  omeprazole (PRILOSEC) 20 MG capsule Take 20 mg by mouth daily. 02/25/18   [provider]  predniSONE (DELTASONE) 1 MG tablet TAKE 2 TABLETS BY MOUTH DAILY WITH BREAKFAST FOR 28 DAYS THEN 1 TABLET (1MG  TOTAL)  DAILY WITH BREAKFAST FOR 28 DAYS 06/22/19   Chesley Mires, MD  predniSONE (DELTASONE) 5 MG tablet Take 0.5 tablets (2.5 mg total) by mouth daily at 6 (six) AM. 06/22/19   Chesley Mires,  MD     Allergies Fish allergy, Shellfish allergy, and Sulfa antibiotics  Family History  Problem Relation Age of Onset  . Sarcoidosis Sister   . Breast cancer Sister   . Sarcoidosis Sister   . Sarcoidosis Sister     Social History Social History   Tobacco Use  . Smoking status: Never Smoker  . Smokeless tobacco: Never Used  Vaping Use  . Vaping Use: Never used  Substance Use Topics  . Alcohol use: No  . Drug use: No    Review of Systems  Constitutional: No fevers reported Eyes: No visual changes.  ENT: No sore throat. Cardiovascular: Denies chest pain. Respiratory: Denies shortness of breath. Gastrointestinal: No abdominal pain.  No vomiting Genitourinary: Negative for dysuria. Musculoskeletal: Negative for back pain. Skin: Negative for rash. Neurological: No neuro deficits, confusion as above   ____________________________________________   PHYSICAL EXAM:  VITAL SIGNS: ED Triage Vitals  Enc Vitals Group     BP 11/27/19 1248 126/72     Pulse Rate 11/27/19 1248 (!) 117     Resp 11/27/19 1248 18     Temp 11/27/19 1248 (!) 101.2 F (38.4 C)     Temp Source 11/27/19 1248 Oral     SpO2 11/27/19 1248 97 %     Weight 11/27/19 1259 84.8 kg (186 lb 15.2 oz)     Height 11/27/19 1259 1.549 m (5\' 1" )     Head Circumference --      Peak Flow --      Pain Score 11/27/19 1259 0     Pain Loc --      Pain Edu? --      Excl. in [place]? --     Constitutional: Alert but slightly confused, oriented to person place and day Eyes: Conjunctivae are normal.  Head: Atraumatic. Nose: No congestion/rhinnorhea. Mouth/Throat: Mucous membranes are moist.    Cardiovascular: Tachycardia, regular rhythm. Grossly normal heart sounds.  Good peripheral circulation. Respiratory: Normal respiratory rate, no retractions, bilateral rales,  Gastrointestinal: Soft and nontender. No distention.  No CVA tenderness. Genitourinary: deferred Musculoskeletal: No lower extremity tenderness nor  edema.  Warm and well perfused Neurologic:  Normal speech and language. No gross focal neurologic deficits are appreciated.  Skin:  Skin is warm, dry and intact. No rash noted. Psychiatric: Mood and affect are normal. Speech and behavior are normal.  ____________________________________________   LABS (all labs ordered are listed, but only abnormal results are displayed)  Labs Reviewed  COMPREHENSIVE METABOLIC PANEL - Abnormal; Notable for the following components:      Result Value   Glucose, Bld 120 (*)    Creatinine, Ser 1.33 (*)    GFR, Estimated 43 (*)    All other components within normal limits  CBC WITH DIFFERENTIAL/PLATELET - Abnormal; Notable for the following components:   WBC 11.5 (*)    Monocytes Absolute 1.2 (*)    Abs Immature Granulocytes 0.15 (*)  All other components within normal limits  URINALYSIS, COMPLETE (UACMP) WITH MICROSCOPIC - Abnormal; Notable for the following components:   Color, Urine YELLOW (*)    APPearance CLEAR (*)    All other components within normal limits  APTT - Abnormal; Notable for the following components:   aPTT 42 (*)    All other components within normal limits  CULTURE, BLOOD (ROUTINE X 2)  CULTURE, BLOOD (ROUTINE X 2)  URINE CULTURE  RESPIRATORY PANEL BY RT PCR (FLU A&B, COVID)  LACTIC ACID, PLASMA  PROTIME-INR  LACTIC ACID, PLASMA   ____________________________________________  EKG  ED ECG REPORT I, Lavonia Drafts, the attending physician, personally viewed and interpreted this ECG.  Date: 11/27/2019  Rhythm: normal sinus rhythm QRS Axis: normal Intervals: Right bundle-branch block ST/T Wave abnormalities: normal Narrative Interpretation: no evidence of acute ischemia  ____________________________________________  RADIOLOGY  Chest x-ray reviewed by me, scarring right greater than left, confirmed by radiology ____________________________________________   PROCEDURES  Procedure(s) performed:  No  Procedures   Critical Care performed: No ____________________________________________   INITIAL IMPRESSION / ASSESSMENT AND PLAN / ED COURSE  Pertinent labs & imaging results that were available during my care of the patient were reviewed by me and considered in my medical decision making (see chart for details).  Patient presents with altered mental status as described above.  Found to be febrile upon presentation and tachycardic, highly concerning for sepsis.  COVID-19 is also on differential.  Code sepsis activated  Chest x-ray reviewed by me, significant scarring primarily in the right lung unclear if superimposed infection, IV Rocephin and azithromycin ordered for pneumonia versus UTI  Normal blood pressure, pending lactic acid, will give IV fluid bolus  Mildly elevated white blood cell count, lactic acid normal.  Urinalysis overall reassuring, Covid test pending. Have discussed with the hospitalist for admission    ____________________________________________   FINAL CLINICAL IMPRESSION(S) / ED DIAGNOSES  Final diagnoses:  Sepsis, due to unspecified organism, unspecified whether acute organ dysfunction present [facility])        Note:  This document was prepared using Dragon voice recognition software and may include unintentional dictation errors.   Lavonia Drafts, MD 11/27/19 1434

## 2019-11-27 NOTE — ED Triage Notes (Signed)
Pt arrived via [facility] from home with reports of possible UTI. Per EMS, family stated pt started having weakness and confusion yesterday, worse today. Pt has hx of UTI concerned another UTI.  Per EMS home smelled strong like urine. Pt denies any pain on arrival.

## 2019-11-27 NOTE — ED Notes (Signed)
Lab here to draw second set of cultures

## 2019-11-27 NOTE — H&P (Signed)
History and Physical    PLEASE NOTE THAT DRAGON DICTATION SOFTWARE WAS USED IN THE CONSTRUCTION OF THIS NOTE.   Darlene Stafford RCV:[id number] DOB: [birth year] DOA: 11/27/2019  PCP: Katherina Mires, MD Patient coming from: home   I have personally briefly reviewed patient's old medical records in [place]  Chief Complaint: confusion  HPI: Darlene Stafford is a 69 y.o. female with medical history significant for moderate persistent asthma, acquired hypothyroidism, allergic rhinitis, stage IIIa chronic kidney disease with baseline creatinine 1.1-1.3, type 2 diabetes mellitus managed via lifestyle modifications with most recent hemoglobin A1c 5.0% in March 2020, sarcoidosis, who is admitted to [facility] on 11/27/2019 with acute metabolic encephalopathy after presenting from home to [facility] Emergency Department for evaluation of confusion.   In the setting of patient's presenting acute encephalopathy, the following history is provided by the patient's family, my discussions with the emergency department physician, and via chart review.  The patient's family report the patient has exhibited somnolence and confusion relative to her baseline mental status over the course of the last 1 to 2 days.  They report that she has been sleeping longer into the day relative to normal, and that she has been more difficult to arouse relative to baseline over that timeframe.  Pain and potential new mild nonproductive cough over the last few days, but report that the patient has not been complaining of any shortness of breath, chest pain, diaphoresis, or palpitations over the days leading up to today's presentation.  They are also not aware that the patient has been experiencing any subjective fever, chills, rigors, or generalized myalgias.  Denies any known nausea, vomiting, diarrhea, abdominal pain, or rash.   No recent traveling or known COVID-19 exposures.  Family reports that the  patient received her single dose of [place] COVID-19 vaccination in April 2021, without any subsequent boosters.  It is currently unclear if there have been any recent changes to her outpatient medication regimen.  Of note, the patient's family conveys that the patient has exhibited similar confusion and somnolence times of prior urinary tract infections.  No recent trauma, including no recent falls.     ED Course:  Vital signs in the ED were notable for the following: Temperature max 101.2; heart rate 68-1 17; blood pressure 115/47 - 128/61; respiratory rate 13-18, and oxygen saturation 95 to 97% on room air.  Labs were notable for the following: CMP notable for the following: Sodium 138, bicarbonate 29, creatinine 1.33 relative to most recent prior creatinine) 1.15 on 03/24/2019, glucose 120, and liver enzymes found to be within normal limits.  CBC notable for the following:-Cell count of 11,500 with 57% neutrophils.  Lactic acid 1.4.  Urinalysis notable for the following: 0-5 white blood cells, no bacteria, no squamous epithelial cells, nitrate negative, leukocyte esterase negative.  Nasopharyngeal COVID-19 PCR performed in the ED today was found to be negative.  Rapid influenza A/B found to be negative.  Blood cultures x2 were collected prior to initiation of any antibiotics.  Chest x-ray evaluation complicated by the presence of extensive pulmonary scarring/fibrosis, right worse than left, without overt evidence of interval change in airspace consolidation.  While in the ED, the following were administered: Azithromycin 5 mg IV x1, Rocephin 1 g IV x1, normal saline x1 L bolus followed by initiation of lactated Ringer's at 150 cc/h.    Review of Systems: As per HPI otherwise 10 point review of systems negative.   Past Medical  History:  Diagnosis Date  . Asthma   . CVA (cerebral vascular accident) ([place])   . Diabetes mellitus without complication ([place])   . Sarcoidosis of other  sites    Ocular    Past Surgical History:  Procedure Laterality Date  . PARTIAL HYSTERECTOMY  1998  . REPLACEMENT TOTAL KNEE  2015    Social History:  reports that she has never smoked. She has never used smokeless tobacco. She reports that she does not drink alcohol and does not use drugs.   Allergies  Allergen Reactions  . Fish Allergy Anaphylaxis  . Shellfish Allergy Anaphylaxis  . Sulfa Antibiotics Hives and Itching    Family History  Problem Relation Age of Onset  . Sarcoidosis Sister   . Breast cancer Sister   . Sarcoidosis Sister   . Sarcoidosis Sister      Prior to Admission medications   Medication Sig Start Date End Date Taking? Authorizing Provider  albuterol (PROVENTIL) (2.5 MG/3ML) 0.083% nebulizer solution USE 1 VIAL IN NEBULIZER TWICE DAILY 10/24/19   Chesley Mires, MD  albuterol (VENTOLIN HFA) 108 (90 Base) MCG/ACT inhaler INHALE 1 TO 2 PUFFS BY MOUTH EVERY 4 HOURS AS NEEDED FOR SHORTNESS OF BREATH 07/25/19   Parrett, Tammy S, NP  amLODipine (NORVASC) 10 MG tablet Take 10 mg by mouth daily. 12/20/18   [provider]  atorvastatin (LIPITOR) 40 MG tablet Take 40 mg by mouth daily.    [provider]  carvedilol (COREG) 12.5 MG tablet Take 12.5 mg by mouth 2 (two) times daily with a meal.    [provider]  cetirizine (ZYRTEC) 10 MG tablet Take 10 mg by mouth daily as needed. 12/17/18   [provider]  ferrous sulfate 325 (65 FE) MG tablet Take 1 tablet (325 mg total) by mouth 2 (two) times daily with a meal. 04/17/18   Regalado, Belkys A, MD  fluticasone (FLONASE) 50 MCG/ACT nasal spray Place 2 sprays into both nostrils daily. 12/20/18   [provider]  Fluticasone Furoate (ARNUITY ELLIPTA) 200 MCG/ACT AEPB Inhale 1 puff into the lungs daily. 01/17/19   Chesley Mires, MD  guaiFENesin (MUCINEX) 600 MG 12 hr tablet Take 1 tablet (600 mg total) by mouth 2 (two) times daily. Patient taking differently: Take 600 mg by  mouth as needed for to loosen phlegm.  04/14/15   Barton Dubois, MD  levothyroxine (SYNTHROID, LEVOTHROID) 25 MCG tablet Take 1 tablet (25 mcg total) by mouth daily at 6 (six) AM. 04/18/18   Regalado, Belkys A, MD  magnesium oxide (MAG-OX) 400 (241.3 Mg) MG tablet Take 1 tablet (400 mg total) by mouth 2 (two) times daily. 04/20/18   Regalado, Belkys A, MD  montelukast (SINGULAIR) 10 MG tablet Take 1 tablet (10 mg total) by mouth at bedtime. 08/22/16   Chesley Mires, MD  omeprazole (PRILOSEC) 20 MG capsule Take 20 mg by mouth daily. 02/25/18   [provider]  predniSONE (DELTASONE) 1 MG tablet TAKE 2 TABLETS BY MOUTH DAILY WITH BREAKFAST FOR 28 DAYS THEN 1 TABLET (1MG TOTAL)  DAILY WITH BREAKFAST FOR 28 DAYS 06/22/19   Chesley Mires, MD  predniSONE (DELTASONE) 5 MG tablet Take 0.5 tablets (2.5 mg total) by mouth daily at 6 (six) AM. 06/22/19   Chesley Mires, MD     Objective    Physical Exam: Vitals:   11/27/19 1248 11/27/19 1259 11/27/19 1300 11/27/19 1402  BP: 126/72  128/61 (!) 115/47  Pulse: (!) 117  68 (!) 44  Resp: _0 Temp: (!) 101.2 F (38.4 C)     TempSrc: Oral     SpO2: 97%  95% 95%  Weight:  84.8 kg    Height:  _1  (1.549 m)      General: appears to be stated age; somnolent; will briefly open her eyes to verbal stimuli before falling back asleep; unable to follow instructions at this time. Skin: warm, dry, no rash Head:  AT/[place] Mouth:  Oral mucosa membranes appear moist, normal dentition Neck: supple; trachea midline Heart:  RRR; did not appreciate any M/R/G Lungs: diminished bibasilar breath sounds, otherwise, CTAB; did not appreciate any wheezes, rales, or rhonchi Abdomen: + BS; soft, ND Vascular: 2+ pedal pulses b/l; 2+ radial pulses b/l Extremities: no peripheral edema, no muscle wasting Neuro: In the setting of the patient's current mental status and associated inability to follow instructions, unable to perform full neurologic exam at this time.  As such,  assessment of strength, sensation, and cranial nerves is limited at this time. Patient noted to spontaneously move all 4 extremities. No tremors.    Labs on Admission: I have personally reviewed following labs and imaging studies  CBC: Recent Labs  Lab 11/27/19 1258  WBC 11.5*  NEUTROABS 6.6  HGB 13.8  HCT 40.2  MCV 89.1  PLT 932   Basic Metabolic Panel: Recent Labs  Lab 11/27/19 1258  NA 138  K 3.7  CL 101  CO2 29  GLUCOSE 120*  BUN 9  CREATININE 1.33*  CALCIUM 9.0   GFR: Estimated Creatinine Clearance: 39.5 mL/min (A) (by C-G formula based on SCr of 1.33 mg/dL (H)). Liver Function Tests: Recent Labs  Lab 11/27/19 1258  AST 22  ALT 11  ALKPHOS 80  BILITOT 1.1  PROT 7.5  ALBUMIN 4.0   No results for input(s): LIPASE, AMYLASE in the last 168 hours. No results for input(s): AMMONIA in the last 168 hours. Coagulation Profile: Recent Labs  Lab 11/27/19 1258  INR 1.0   Cardiac Enzymes: No results for input(s): CKTOTAL, CKMB, CKMBINDEX, TROPONINI in the last 168 hours. BNP (last 3 results) No results for input(s): PROBNP in the last 8760 hours. HbA1C: No results for input(s): HGBA1C in the last 72 hours. CBG: No results for input(s): GLUCAP in the last 168 hours. Lipid Profile: No results for input(s): CHOL, HDL, LDLCALC, TRIG, CHOLHDL, LDLDIRECT in the last 72 hours. Thyroid Function Tests: No results for input(s): TSH, T4TOTAL, FREET4, T3FREE, THYROIDAB in the last 72 hours. Anemia Panel: No results for input(s): VITAMINB12, FOLATE, FERRITIN, TIBC, IRON, RETICCTPCT in the last 72 hours. Urine analysis:    Component Value Date/Time   COLORURINE YELLOW (A) 11/27/2019 1354   APPEARANCEUR CLEAR (A) 11/27/2019 1354   LABSPEC 1.014 11/27/2019 1354   PHURINE 5.0 11/27/2019 1354   GLUCOSEU NEGATIVE 11/27/2019 1354   [place] 11/27/2019 [place] 11/27/2019 1354   KETONESUR NEGATIVE 11/27/2019 1354   PROTEINUR NEGATIVE 11/27/2019  1354   NITRITE NEGATIVE 11/27/2019 1354   LEUKOCYTESUR NEGATIVE 11/27/2019 1354    Radiological Exams on Admission: DG Chest Port 1 View  Result Date: 11/27/2019 CLINICAL DATA:  UTI.  Possible sepsis EXAM: PORTABLE CHEST 1 VIEW COMPARISON:  03/21/2019, 04/12/2018 FINDINGS: Stable cardiomediastinal contours. Atherosclerotic calcification of the aortic knob. Extensive chronic fibrotic changes throughout the right lung. Mild fibrotic changes within the left mid lung and left lung base. No evidence of a new superimposed airspace consolidation. No pleural effusion or pneumothorax  is seen. IMPRESSION: Extensive pulmonary scarring/fibrosis, right worse than left. No evidence of a new superimposed airspace consolidation. Electronically Signed   By: Davina Poke D.O.   On: 11/27/2019 13:30     EKG: Independently reviewed, with my interpretation as follows. Sinus tachycardia with RBBB and LAFB, ventricular rate 115 bpm, QTc 483 ms, and non-specific T wave inversion in lead III.   Assessment/Plan   Darlene Stafford is a 69 y.o. female with medical history significant for moderate persistent asthma, acquired hypothyroidism, allergic rhinitis, stage IIIa chronic kidney disease with baseline creatinine 1.1-1.3, type 2 diabetes mellitus managed via lifestyle modifications with most recent hemoglobin A1c 5.0% in March 2020, sarcoidosis, who is admitted to [facility] on 11/27/2019 with acute metabolic encephalopathy after presenting from home to [facility] Emergency Department for evaluation of confusion.    Principal Problem:   Acute metabolic encephalopathy Active Problems:   CAP (community acquired pneumonia)   Diabetes mellitus without complication (HCC)   Asthma   Hypothyroidism   Severe sepsis ([place])   Fever   Generalized weakness    #) Acute metabolic encephalopathy: 1 to 2 days of confusion and somnolence relative to mental status, with suspected underlying  infectious contribution in the setting of presenting objective fever as well as tachycardia, potentially the basis of her pneumonia, as prescribed.  Urinalysis not suggestive of underlying UTI.  No overt evidence of cellulitis.  Physical exam of the abdomen appears benign, with liver enzymes within normal limits.  No other overt metabolic contribution at this time, including presenting with sugar of 120.  Will check VBG to evaluate for any contribution from hypercapnic encephalopathy. Will check TSH in context of history of acquired hypothyroidism on thyroid supplementation as an outpatient.  No focal neurologic deficits to suggest a contribution from an underlying acute CVA.  Seizure is also felt to be unlikely.  We will keep patient n.p.o. until mental status improves sufficiently that patient is able to participate in and pass nursing bedside swallow evaluation.   Plan: NPO. Nursing bedside swallow evaluation prior to the initiation of a diet/oral medications, as described above. CMP in the morning. Repeat CBC in the morning. check VBG to evaluate for any contribution from hypercapnic encephalopathy.  Work-up and management of suspected underlying infectious process, including work-up of potential community-acquired pneumonia, as further described below.  Gentle IV fluids in the form of lactated Ringer's at 125 cc/h x 12 hours.  Check UDS.  Check TSH and hemoglobin A1c.  Check CPK.  Fall precautions ordered.     #) Severe sepsis due to suspected community-acquired pneumonia: Underlying infectious process suspected in the setting of presenting objective fever, tachycardia, and mild leukocytosis thereby meeting SIRS criteria, with community-acquired potential underlying infectious process given potential recent new onset nonproductive cough in the setting of CXR evaluation complicated by the presence of extensive pulmonary scarring/fibrosis.  Of note, no other overt source of underlying infectious process  at this time, including presenting urinalysis consistent with urinary tract infection.  Additionally, nasopharyngeal COVID-19 PCR performed in the ED today is found to be negative, while rapid influenza A/B was also found to be negative.  Patient sepsis meets criteria to be considered severe in nature on the basis of concomitant presenting acute encephalopathy. LA 1.4.  Of note, in the absence of elevated lactic acid greater than 4 or hypotension, criteria are not met at this time for administration of a 30 mL/kg IVF bolus at this time.  Blood cultures x2 collected  in the ED today prior to initiation of Rocephin and IV azithromycin.  Plan: Monitor for results of blood cultures x2 collected today.  Continue azithromycin and Rocephin.  Add on procalcitonin to further evaluate potential underlying pneumonia.  Repeat CBC with differential in the morning.  Add on strep urine antigen.  Lactated Ringer's at 125 cc/h x 12 hours.  Monitor strict I's and O's.  Repeat CMP in the morning. Could consider CT chest to further evaluate potential underlying pneumonia given complications associated with interpretation presenting chest x-ray in the setting of chronic pulmonary scarring and fibrosis, as above.  As needed acetaminophen.     #) Generalized weakness: Patient's family reported that the patient was complaining of generalized weakness over the last few days in the absence of any acute focal neurologic deficits.  Suspect that this is on the basis of physiologic stress stemming from suspected underlying infectious presentation, as above. Will also check TSH in the setting of history of acquired hypothyroidism.  Plan: Work-up management of presenting suspected severe sepsis due to potential underlying community-acquired pneumonia.  Physical therapy consult has been placed for the morning.  Check TSH.      #) Type 2 diabetes mellitus: Most recent hemoglobin A1c noted to be 5.0% when checked in March 2020.  Appears  to be managed via lifestyle modifications, as the patient is not currently on any oral hypoglycemic agents or any exogenous insulin at home.  Presenting blood sugar noted to be 120 per CMP.   Plan: Accu-Cheks before every meal and at bedtime to monitor for any evidence of hypoglycemia in the context of presenting acute encephalopathy.  Will refrain from adding sliding scale insulin to be associate with these checks for now, as they are primarily attempting to evaluate for any contributory hypoglycemia.  Check hemoglobin A1c.     #) Moderate persistent asthma: On the following respiratory regimen at home: Fluticasone Furoate 1 puff inhaled Qdaily, Singulair, and prn albuterol inhaler. Per initial pharmacy reconciliation, it also appears that the patient may be on daily low-dose oral Prednisone 2.5 mg. She follows with [facility], specifically with Dr. Chesley Mires.  Presentation is noted to be ER to be associated with an acute asthma exacerbation, with she would certainly be high risk for development of such in the setting of suspected underlying pneumonia, as above.   Plan: Add on serum magnesium level.  Continue home fluticasone and Singulair.  As needed albuterol nebulizer.  Monitor continuous pulse oximetry. Will attempt to reconcile whether or not the patient is currently on daily systemic prednisone therapy.  We will also attempt additional chart review to evaluate any prior pulmonary function test results.  Check VBG, although this is more so to evaluate for any element of hypercapnic encephalopathy given the patient's encephalopathic presentation.     #) Acquired hypothyroidism on Synthroid as an outpatient.  Plan: Check TSH, particular the setting of presenting acute encephalopathy.  Continue home Synthroid.     #) Allergic rhinitis: On daily scheduled intranasal Flonase as well as as needed Zyrtec.  Plan: Continue home scheduled intranasal Flonase.  We will hold home as needed  Zyrtec for now.      #) Stage IIIa chronic kidney disease: Associated with baseline creatinine of 1.1-1.3.  Presenting labs reflect serum creatinine value within this baseline range.  Plan: Monitor strict I's and O's and daily weights.  Attempt to avoid nephrotoxic agents.  Repeat CMP in the morning.    DVT prophylaxis: Lovenox 40 mg subcu  daily Code Status: Full code Family Communication: none Disposition Plan: Per Rounding Team Consults called: none  Admission status: Inpatient; med telemetry.  COVID-19 Vaccination status: Single dose of [employer] vaccine in April 2021    PLEASE NOTE THAT DRAGON DICTATION SOFTWARE WAS USED IN THE CONSTRUCTION OF THIS NOTE.   [facility] Pager [phone] From 12PM- 12AM  Otherwise, please contact night-coverage  [email or web address] Password [email or web address]  11/27/2019, 2:35 PM

## 2019-11-27 NOTE — ED Notes (Signed)
Granddaughter updated on patient's condition, advised her that she would be admitted to the hospital

## 2019-11-27 NOTE — ED Notes (Signed)
Attempted to get second set of cultures, unsuccessful, unable to contact lab, pt wet brief changed, purewick placed and in and out catheter completed to obtain urine specimen.

## 2019-11-27 NOTE — ED Notes (Signed)
Pt given 4oz orange juice for borderline low BGL.

## 2019-11-28 ENCOUNTER — Inpatient Hospital Stay: Payer: Medicare Other

## 2019-11-28 DIAGNOSIS — R531 Weakness: Secondary | ICD-10-CM | POA: Diagnosis not present

## 2019-11-28 DIAGNOSIS — J189 Pneumonia, unspecified organism: Secondary | ICD-10-CM | POA: Diagnosis not present

## 2019-11-28 DIAGNOSIS — A419 Sepsis, unspecified organism: Secondary | ICD-10-CM | POA: Diagnosis not present

## 2019-11-28 DIAGNOSIS — G9341 Metabolic encephalopathy: Secondary | ICD-10-CM | POA: Diagnosis not present

## 2019-11-28 LAB — COMPREHENSIVE METABOLIC PANEL
ALT: 15 U/L (ref 0–44)
AST: 38 U/L (ref 15–41)
Albumin: 3.2 g/dL — ABNORMAL LOW (ref 3.5–5.0)
Alkaline Phosphatase: 69 U/L (ref 38–126)
Anion gap: 10 (ref 5–15)
BUN: 11 mg/dL (ref 8–23)
CO2: 23 mmol/L (ref 22–32)
Calcium: 8.4 mg/dL — ABNORMAL LOW (ref 8.9–10.3)
Chloride: 102 mmol/L (ref 98–111)
Creatinine, Ser: 1.53 mg/dL — ABNORMAL HIGH (ref 0.44–1.00)
GFR, Estimated: 37 mL/min — ABNORMAL LOW (ref >60)
Glucose, Bld: 101 mg/dL — ABNORMAL HIGH (ref 70–99)
Potassium: 3.8 mmol/L (ref 3.5–5.1)
Sodium: 135 mmol/L (ref 135–145)
Total Bilirubin: 1.6 mg/dL — ABNORMAL HIGH (ref 0.3–1.2)
Total Protein: 6.3 g/dL — ABNORMAL LOW (ref 6.5–8.1)

## 2019-11-28 LAB — MAGNESIUM: Magnesium: 1.8 mg/dL (ref 1.7–2.4)

## 2019-11-28 LAB — GASTROINTESTINAL PANEL BY PCR, STOOL (REPLACES STOOL CULTURE)

## 2019-11-28 LAB — CBC WITH DIFFERENTIAL/PLATELET
Abs Immature Granulocytes: 0.13 10*3/uL — ABNORMAL HIGH (ref 0.00–0.07)
Basophils Absolute: 0.1 10*3/uL (ref 0.0–0.1)
Basophils Relative: 1 %
Eosinophils Absolute: 0.1 10*3/uL (ref 0.0–0.5)
Eosinophils Relative: 1 %
HCT: 36.8 % (ref 36.0–46.0)
Hemoglobin: 13 g/dL (ref 12.0–15.0)
Immature Granulocytes: 1 %
Lymphocytes Relative: 30 %
Lymphs Abs: 3.5 10*3/uL (ref 0.7–4.0)
MCH: 31.3 pg (ref 26.0–34.0)
MCHC: 35.3 g/dL (ref 30.0–36.0)
MCV: 88.5 fL (ref 80.0–100.0)
Monocytes Absolute: 1.8 10*3/uL — ABNORMAL HIGH (ref 0.1–1.0)
Monocytes Relative: 15 %
Neutro Abs: 6.2 10*3/uL (ref 1.7–7.7)
Neutrophils Relative %: 52 %
Platelets: 335 10*3/uL (ref 150–400)
RBC: 4.16 MIL/uL (ref 3.87–5.11)
RDW: 12 % (ref 11.5–15.5)
WBC: 11.8 10*3/uL — ABNORMAL HIGH (ref 4.0–10.5)
nRBC: 0.2 % (ref 0.0–0.2)

## 2019-11-28 LAB — URINE CULTURE: Culture: NO GROWTH

## 2019-11-28 LAB — GLUCOSE, CAPILLARY
Glucose-Capillary: 76 mg/dL (ref 70–99)
Glucose-Capillary: 138 mg/dL — ABNORMAL HIGH (ref 70–99)
Glucose-Capillary: 96 mg/dL (ref 70–99)
Glucose-Capillary: 79 mg/dL (ref 70–99)
Glucose-Capillary: 106 mg/dL — ABNORMAL HIGH (ref 70–99)
Glucose-Capillary: 108 mg/dL — ABNORMAL HIGH (ref 70–99)

## 2019-11-28 LAB — CK: Total CK: 577 U/L — ABNORMAL HIGH (ref 38–234)

## 2019-11-28 LAB — C DIFFICILE QUICK SCREEN W PCR REFLEX
C Diff antigen: NEGATIVE
C Diff interpretation: NOT DETECTED
C Diff toxin: NEGATIVE

## 2019-11-28 LAB — TSH: TSH: 3.017 u[IU]/mL (ref 0.350–4.500)

## 2019-11-28 MED ORDER — SODIUM CHLORIDE 0.9 % IV SOLN: Freq: Once | INTRAVENOUS | Status: AC

## 2019-11-28 MED ORDER — DEXTROSE-NACL 5-0.9 % IV SOLN: INTRAVENOUS | Status: DC

## 2019-11-28 MED ORDER — ONDANSETRON HCL 4 MG/2ML IJ SOLN
4.0000 mg | Freq: Four times a day (QID) | INTRAMUSCULAR | Status: DC | PRN
  Administered 2019-11-28 – 2019-11-30 (×5): 4 mg via INTRAVENOUS
  Filled 2019-11-28 (×5): qty 2

## 2019-11-28 MED ORDER — FLUTICASONE PROPIONATE HFA 220 MCG/ACT IN AERO
2.0000 | INHALATION_SPRAY | Freq: Two times a day (BID) | RESPIRATORY_TRACT | Status: DC
  Administered 2019-11-28 – 2019-11-30 (×4): 2 via RESPIRATORY_TRACT
  Filled 2019-11-28: qty 12

## 2019-11-28 MED ORDER — ONDANSETRON HCL 4 MG/2ML IJ SOLN
INTRAMUSCULAR | Status: AC
  Administered 2019-11-28: 4 mg via INTRAVENOUS
  Filled 2019-11-28: qty 2

## 2019-11-28 NOTE — Progress Notes (Signed)
PT Cancellation Note  Patient Details Name: Darlene Stafford MRN: [id number] DOB: 09-Aug-1950   Cancelled Treatment:    Reason Eval/Treat Not Completed: Fatigue/lethargy limiting ability to participate (Consult received and chart reviewed. Per primary RN, patient lethargic, unable to follow commands at this time; recommends hold and re-attempt at later time/date as appropriate.)  Khristine Verno H. Owens Shark, PT, DPT, NCS 11/28/19, 10:43 AM [phone]

## 2019-11-28 NOTE — Progress Notes (Signed)
PT Cancellation Note  Patient Details Name: Darlene Stafford MRN: [id number] DOB: May 30, 1950   Cancelled Treatment:    Reason Eval/Treat Not Completed:  (Evaluation re-attempted.  Per primary RN, patient with recent vomiting.  Not appropriate for PT eval at current time.  Will re-attempt next date as medically appropriate and available.)  Charlei Ramsaran H. Owens Shark, PT, DPT, NCS 11/28/19, 3:11 PM [phone]

## 2019-11-28 NOTE — Progress Notes (Addendum)
PROGRESS NOTE    Darlene Stafford   DEY:[id number]  DOB: 08/26/1950  PCP: Katherina Mires, MD    DOA: 11/27/2019 LOS: 1   Brief Narrative   Darlene Stafford is a 69 y.o. female with medical history of moderate persistent asthma, acquired hypothyroidism, allergic rhinitis, stage IIIa CKD (baseline Cr 1.1-1.3), type 2 diabetes mellitus (last A1c 5.0%), sarcoidosis.  Admitted to on 11/27/2019 with acute metabolic encephalopathy after presenting to the ED from home for evaluation of confusion.  Evaluation in the ED was consistent with severe sepsis secondary to community-acquitted pneumonia.        Assessment & Plan   Principal Problem:   Acute metabolic encephalopathy Active Problems:   Diabetes mellitus without complication ([place])   Hypothyroidism   CAP (community acquired pneumonia)   Asthma   Severe sepsis ([place])   Fever   Generalized weakness   Severe sepsis due to Community-acquired pneumonia - POA. Severe sepsis as evidenced by tachycardia, leukocytosis and xray consistent with pneumonia, encephalopathy reflects organ dysfunction.  Presented with confusion, but today does endorse recent cough.  Lactic acid normal. --Continue Rocephin and Zithromax (10/25 >>) --Follow up cultures, urinary antigens --AM CBC, CMP --on IV fluids as below --Tylenol PRN fever or mild pain --Bronchodilators --Will consider pulmonology consult if not improving, given underlying asthma and sarcoidosis  Acute metabolic encephalopathy - present on admission, likely due to infection.  Improved this AM.  --continue to treat infection as above --fall precautions  Diarrhea - will check for C diff.  If negative can give Imodium.    Hypoglycemia - continue D5-LR @ 100 cc/hr.  Close BG monitoring. Hypoglycemia protocol.  Hypomagnesemia - Mg 1.5 on admission, replaced.  Monitor and further replacement as needed.  Rhabdomyolysis - presented with CK of 577 and AKI.  On IV fluids as above.  Repeat CK level with  AM labs.  Acute kidney injury - present on admission.  Cr 1.33 >> 1.53 today.  IV fluids as above. Likely due to infection/sepsis and rhabdo.   --renal ultrasound to rule out obstruction --monitor BMP  Moderate persistent asthma - Continue inhalers, Singulair, low dose daily prednisone. Follows at [facility] pulmonology with Dr. Halford Chessman.   Currently asthma does not appear exacerbated.  No wheezing on exam.  Hx of sarcoidosis - consider pulmonology consult  Type 2 diabetes - currently with hypoglycemia on D5-LR.  Hold insulin for now.  Place on sliding scale Novolog when indicated. Patient not on oral meds or insulin at home currently.  Last A1c in Feb 2021 was 5.0%. Repeat A1c pending.  Acquired Hypothyroidism - continue levothyroxine.  TSH normal.  Allergic rhinitis - continue Flonase   Obesity: Body mass index is 35.32 kg/m.  Complicates overall care and prognosis.    DVT prophylaxis: enoxaparin (LOVENOX) injection 40 mg Start: 11/28/19 0800 SCDs Start: 11/27/19 1531   Diet:  Diet Orders (From admission, onward)    Start     Ordered   11/27/19 1301  Diet NPO time specified  (Septic presentation on arrival (screening labs, nursing and treatment orders for obvious sepsis))  Diet effective now        11/27/19 1301            Code Status: Full Code    Subjective 11/28/19    Pt seen this AM at bedside.  She is awake and talking, does not seem confused but is a bit slow in answering questions.  Says her mind feels like its clearing up today.  Does report a recent cough.  Is having diarrhea.  No fever chills or chest pain or SOB at rest.     Disposition Plan & Communication   Status is: Inpatient  Remains inpatient appropriate because:IV treatments appropriate due to intensity of illness or inability to take PO   Dispo: The patient is from: Home              Anticipated d/c is to: Home              Anticipated d/c date is: 3 days              Patient currently is not  medically stable to d/c.        Family Communication: none at bedside, will attempt to call    Consults, Procedures, Significant Events   Consultants:   None  Procedures:   None  Antimicrobials:  Anti-infectives (From admission, onward)   Start     Dose/Rate Route Frequency Ordered Stop   11/28/19 1400  azithromycin (ZITHROMAX) 500 mg in sodium chloride 0.9 % 250 mL IVPB        500 mg 250 mL/hr over 60 Minutes Intravenous Every 24 hours 11/27/19 1539     11/28/19 1400  cefTRIAXone (ROCEPHIN) 1 g in sodium chloride 0.9 % 100 mL IVPB        1 g 200 mL/hr over 30 Minutes Intravenous Every 24 hours 11/27/19 1539     11/27/19 1330  cefTRIAXone (ROCEPHIN) 1 g in sodium chloride 0.9 % 100 mL IVPB        1 g 200 mL/hr over 30 Minutes Intravenous  Once 11/27/19 1318 11/27/19 1441   11/27/19 1330  azithromycin (ZITHROMAX) 500 mg in sodium chloride 0.9 % 250 mL IVPB        500 mg 250 mL/hr over 60 Minutes Intravenous  Once 11/27/19 1318 11/27/19 1539         Objective   Vitals:   11/27/19 1630 11/27/19 1707 11/27/19 2233 11/28/19 0604  BP: 119/85 126/76 (!) 133/93 114/63  Pulse: (!) 109 (!) 110 (!) 110 (!) 104  Resp: 20 (!) 21 18 (!) 25  Temp:  98.9 F (37.2 C)  (!) 101.5 F (38.6 C)  TempSrc:  Oral  Oral  SpO2: 98% 100% 100% 95%  Weight:      Height:        Intake/Output Summary (Last 24 hours) at 11/28/2019 0730 Last data filed at 11/27/2019 2223 Gross per 24 hour  Intake 2331.21 ml  Output --  Net 2331.21 ml   Filed Weights   11/27/19 1259  Weight: 84.8 kg    Physical Exam:  General exam: awake, alert, no acute distress, obese Respiratory system: right side diffuse rhonchi, left side clear, no wheezes, normal respiratory effort. Cardiovascular system: normal S1/S2, RRR, no pedal edema.   Gastrointestinal system: soft, NT, ND, +bowel sounds. Central nervous system: no gross focal neurologic deficits, normal speech Extremities: moves all, no edema,  normal tone Psychiatry: normal mood, congruent affect  Labs   Data Reviewed: I have personally reviewed following labs and imaging studies  CBC: Recent Labs  Lab 11/27/19 1258 11/28/19 0456  WBC 11.5* 11.8*  NEUTROABS 6.6 6.2  HGB 13.8 13.0  HCT 40.2 36.8  MCV 89.1 88.5  PLT 399 570   Basic Metabolic Panel: Recent Labs  Lab 11/27/19 1258 11/27/19 1536 11/28/19 0456  NA 138  --  135  K 3.7  --  3.8  CL  101  --  102  CO2 29  --  23  GLUCOSE 120*  --  101*  BUN 9  --  11  CREATININE 1.33*  --  1.53*  CALCIUM 9.0  --  8.4*  MG  --  1.5* 1.8   GFR: Estimated Creatinine Clearance: 34.3 mL/min (A) (by C-G formula based on SCr of 1.53 mg/dL (H)). Liver Function Tests: Recent Labs  Lab 11/27/19 1258 11/28/19 0456  AST 22 38  ALT 11 15  ALKPHOS 80 69  BILITOT 1.1 1.6*  PROT 7.5 6.3*  ALBUMIN 4.0 3.2*   No results for input(s): LIPASE, AMYLASE in the last 168 hours. No results for input(s): AMMONIA in the last 168 hours. Coagulation Profile: Recent Labs  Lab 11/27/19 1258  INR 1.0   Cardiac Enzymes: Recent Labs  Lab 11/28/19 0456  CKTOTAL 577*   BNP (last 3 results) No results for input(s): PROBNP in the last 8760 hours. HbA1C: No results for input(s): HGBA1C in the last 72 hours. CBG: Recent Labs  Lab 11/27/19 1719 11/27/19 2017 11/27/19 2019 11/27/19 2230 11/28/19 0450  GLUCAP 88 58* 73 76 76   Lipid Profile: No results for input(s): CHOL, HDL, LDLCALC, TRIG, CHOLHDL, LDLDIRECT in the last 72 hours. Thyroid Function Tests: Recent Labs    11/28/19 0456  TSH 3.017   Anemia Panel: No results for input(s): VITAMINB12, FOLATE, FERRITIN, TIBC, IRON, RETICCTPCT in the last 72 hours. Sepsis Labs: Recent Labs  Lab 11/27/19 1258 11/27/19 1536  PROCALCITON  --  <0.10  LATICACIDVEN 1.4  --     Recent Results (from the past 240 hour(s))  Culture, blood (Routine x 2)     Status: None (Preliminary result)   Collection Time: 11/27/19 12:58 PM    Specimen: BLOOD  Result Value Ref Range Status   Specimen Description BLOOD RIGHT ANTECUBITAL  Final   Special Requests   Final    BOTTLES DRAWN AEROBIC AND ANAEROBIC Blood Culture results may not be optimal due to an excessive volume of blood received in culture bottles   Culture   Final    NO GROWTH < 24 HOURS Performed at [facility], [address], [place], [place] [address]    Report Status PENDING  Incomplete  Respiratory Panel by RT PCR (Flu A&B, Covid) - Nasopharyngeal Swab     Status: None   Collection Time: 11/27/19  1:54 PM   Specimen: Nasopharyngeal Swab  Result Value Ref Range Status   SARS Coronavirus 2 by RT PCR NEGATIVE NEGATIVE Final    Comment: (NOTE) SARS-CoV-2 target nucleic acids are NOT DETECTED.  The SARS-CoV-2 RNA is generally detectable in upper respiratoy specimens during the acute phase of infection. The lowest concentration of SARS-CoV-2 viral copies this assay can detect is 131 copies/mL. A negative result does not preclude SARS-Cov-2 infection and should not be used as the sole basis for treatment or other patient management decisions. A negative result may occur with  improper specimen collection/handling, submission of specimen other than nasopharyngeal swab, presence of viral mutation(s) within the areas targeted by this assay, and inadequate number of viral copies (<131 copies/mL). A negative result must be combined with clinical observations, patient history, and epidemiological information. The expected result is Negative.  Fact Sheet for Patients:  PinkCheek.be  Fact Sheet for Healthcare Providers:  GravelBags.it  This test is no t yet approved or cleared by the [place] FDA and  has been authorized for detection and/or diagnosis of SARS-CoV-2 by  FDA under an Emergency Use Authorization (EUA). This EUA will remain  in effect (meaning this test can be used) for the  duration of the COVID-19 declaration under Section 564(b)(1) of the Act, 21 U.S.C. section 360bbb-3(b)(1), unless the authorization is terminated or revoked sooner.     Influenza A by PCR NEGATIVE NEGATIVE Final   Influenza B by PCR NEGATIVE NEGATIVE Final    Comment: (NOTE) The Xpert Xpress SARS-CoV-2/FLU/RSV assay is intended as an aid in  the diagnosis of influenza from Nasopharyngeal swab specimens and  should not be used as a sole basis for treatment. Nasal washings and  aspirates are unacceptable for Xpert Xpress SARS-CoV-2/FLU/RSV  testing.  Fact Sheet for Patients: PinkCheek.be  Fact Sheet for Healthcare Providers: GravelBags.it  This test is not yet approved or cleared by the [place] FDA and  has been authorized for detection and/or diagnosis of SARS-CoV-2 by  FDA under an Emergency Use Authorization (EUA). This EUA will remain  in effect (meaning this test can be used) for the duration of the  Covid-19 declaration under Section 564(b)(1) of the Act, 21  U.S.C. section 360bbb-3(b)(1), unless the authorization is  terminated or revoked. Performed at [facility], [address] [place], [place] [address]   Culture, blood (Routine x 2)     Status: None (Preliminary result)   Collection Time: 11/27/19  2:15 PM   Specimen: BLOOD  Result Value Ref Range Status   Specimen Description BLOOD [facility]  Final   Special Requests   Final    BOTTLES DRAWN AEROBIC AND ANAEROBIC Blood Culture adequate volume   Culture   Final    NO GROWTH < 24 HOURS Performed at [facility], [address], [place], [place] [address]    Report Status PENDING  Incomplete      Imaging Studies   DG Chest Port 1 View  Result Date: 11/27/2019 CLINICAL DATA:  UTI.  Possible sepsis EXAM: PORTABLE CHEST 1 VIEW COMPARISON:  03/21/2019, 04/12/2018 FINDINGS: Stable cardiomediastinal contours. Atherosclerotic  calcification of the aortic knob. Extensive chronic fibrotic changes throughout the right lung. Mild fibrotic changes within the left mid lung and left lung base. No evidence of a new superimposed airspace consolidation. No pleural effusion or pneumothorax is seen. IMPRESSION: Extensive pulmonary scarring/fibrosis, right worse than left. No evidence of a new superimposed airspace consolidation. Electronically Signed   By: Davina Poke D.O.   On: 11/27/2019 13:30     Medications   Scheduled Meds:  enoxaparin (LOVENOX) injection  40 mg Subcutaneous Q24H   fluticasone  2 spray Each Nare Daily   Fluticasone Furoate  1 puff Inhalation Daily   insulin aspart  0-6 Units Subcutaneous Q4H   levothyroxine  25 mcg Oral Q0600   montelukast  10 mg Oral QHS   predniSONE  2.5 mg Oral Q0600   Continuous Infusions:  azithromycin     cefTRIAXone (ROCEPHIN)  IV     dextrose 5% lactated ringers 125 mL/hr at 11/28/19 0647       LOS: 1 day    Time spent: 30 minutes    Ezekiel Slocumb, DO [facility]  11/28/2019, 7:30 AM    If 7PM-7AM, please contact night-coverage. How to contact the [facility] Attending or Consulting provider [place] or covering provider during after hours [place], for this patient?    1. Check the care team in [name] and look for a) attending/consulting [facility] provider listed and b) the [facility] team listed 2.  Log into [email or web address] and use [place]'s universal password to access. If you do not have the password, please contact the hospital operator. 3. Locate the [facility] provider you are looking for under [facility] and page to a number that you can be directly reached. 4. If you still have difficulty reaching the provider, please page the [facility] (Director on Call) for the Hospitalists listed on amion for assistance.

## 2019-11-28 NOTE — Progress Notes (Signed)
Pt being having diarrhea since admission, C-diff ad GI negative, this evening pt hard vomited x3times  Zofran given with some relief. Pt stated " I am not feeling well, checked BS 108, BP 78/48(56). NP on called Notified, 500 ml NS Bolus given and MIVF changed see MAR.. BP rechecked and will cont to monitor pt closely .

## 2019-11-28 NOTE — Hospital Course (Signed)
Darlene Stafford is a 69 y.o. female with medical history of moderate persistent asthma, acquired hypothyroidism, allergic rhinitis, stage IIIa CKD (baseline Cr 1.1-1.3), type 2 diabetes mellitus (last A1c 5.0%), sarcoidosis.  Admitted to on 11/27/2019 with acute metabolic encephalopathy after presenting to the ED from home for evaluation of confusion.  Evaluation in the ED was consistent with severe sepsis secondary to community-acquitted pneumonia.

## 2019-11-29 DIAGNOSIS — R6520 Severe sepsis without septic shock: Secondary | ICD-10-CM | POA: Diagnosis not present

## 2019-11-29 DIAGNOSIS — J189 Pneumonia, unspecified organism: Secondary | ICD-10-CM | POA: Diagnosis not present

## 2019-11-29 DIAGNOSIS — A419 Sepsis, unspecified organism: Secondary | ICD-10-CM | POA: Diagnosis not present

## 2019-11-29 DIAGNOSIS — R531 Weakness: Secondary | ICD-10-CM | POA: Diagnosis not present

## 2019-11-29 LAB — MAGNESIUM: Magnesium: 1.7 mg/dL (ref 1.7–2.4)

## 2019-11-29 LAB — COMPREHENSIVE METABOLIC PANEL
ALT: 16 U/L (ref 0–44)
AST: 38 U/L (ref 15–41)
Albumin: 3 g/dL — ABNORMAL LOW (ref 3.5–5.0)
Alkaline Phosphatase: 61 U/L (ref 38–126)
Anion gap: 8 (ref 5–15)
BUN: 15 mg/dL (ref 8–23)
CO2: 25 mmol/L (ref 22–32)
Calcium: 8 mg/dL — ABNORMAL LOW (ref 8.9–10.3)
Chloride: 102 mmol/L (ref 98–111)
Creatinine, Ser: 2.04 mg/dL — ABNORMAL HIGH (ref 0.44–1.00)
GFR, Estimated: 26 mL/min — ABNORMAL LOW (ref >60)
Glucose, Bld: 133 mg/dL — ABNORMAL HIGH (ref 70–99)
Potassium: 3.8 mmol/L (ref 3.5–5.1)
Sodium: 135 mmol/L (ref 135–145)
Total Bilirubin: 1 mg/dL (ref 0.3–1.2)
Total Protein: 5.9 g/dL — ABNORMAL LOW (ref 6.5–8.1)

## 2019-11-29 LAB — CBC WITH DIFFERENTIAL/PLATELET
Abs Immature Granulocytes: 0.06 10*3/uL (ref 0.00–0.07)
Basophils Absolute: 0.1 10*3/uL (ref 0.0–0.1)
Basophils Relative: 1 %
Eosinophils Absolute: 0.3 10*3/uL (ref 0.0–0.5)
Eosinophils Relative: 4 %
HCT: 30.8 % — ABNORMAL LOW (ref 36.0–46.0)
Hemoglobin: 10.3 g/dL — ABNORMAL LOW (ref 12.0–15.0)
Immature Granulocytes: 1 %
Lymphocytes Relative: 14 %
Lymphs Abs: 1.1 10*3/uL (ref 0.7–4.0)
MCH: 30.6 pg (ref 26.0–34.0)
MCHC: 33.4 g/dL (ref 30.0–36.0)
MCV: 91.4 fL (ref 80.0–100.0)
Monocytes Absolute: 1.1 10*3/uL — ABNORMAL HIGH (ref 0.1–1.0)
Monocytes Relative: 14 %
Neutro Abs: 5.2 10*3/uL (ref 1.7–7.7)
Neutrophils Relative %: 66 %
Platelets: 243 10*3/uL (ref 150–400)
RBC: 3.37 MIL/uL — ABNORMAL LOW (ref 3.87–5.11)
RDW: 12.1 % (ref 11.5–15.5)
WBC: 7.8 10*3/uL (ref 4.0–10.5)
nRBC: 0 % (ref 0.0–0.2)

## 2019-11-29 LAB — HEMOGLOBIN A1C
Hgb A1c MFr Bld: 5.3 % (ref 4.8–5.6)
Mean Plasma Glucose: 105 mg/dL

## 2019-11-29 LAB — GLUCOSE, CAPILLARY
Glucose-Capillary: 130 mg/dL — ABNORMAL HIGH (ref 70–99)
Glucose-Capillary: 118 mg/dL — ABNORMAL HIGH (ref 70–99)
Glucose-Capillary: 127 mg/dL — ABNORMAL HIGH (ref 70–99)
Glucose-Capillary: 101 mg/dL — ABNORMAL HIGH (ref 70–99)
Glucose-Capillary: 119 mg/dL — ABNORMAL HIGH (ref 70–99)

## 2019-11-29 LAB — LACTIC ACID, PLASMA
Lactic Acid, Venous: 2 mmol/L (ref 0.5–1.9)
Lactic Acid, Venous: 1.8 mmol/L (ref 0.5–1.9)

## 2019-11-29 LAB — CK: Total CK: 838 U/L — ABNORMAL HIGH (ref 38–234)

## 2019-11-29 MED ORDER — MAGNESIUM SULFATE IN D5W 1-5 GM/100ML-% IV SOLN
1.0000 g | Freq: Once | INTRAVENOUS | Status: AC
  Administered 2019-11-29: 1 g via INTRAVENOUS
  Filled 2019-11-29: qty 100

## 2019-11-29 MED ORDER — ESCITALOPRAM OXALATE 10 MG PO TABS
10.0000 mg | ORAL_TABLET | Freq: Every day | ORAL | Status: DC
  Administered 2019-11-30: 10 mg via ORAL
  Filled 2019-11-29: qty 1

## 2019-11-29 MED ORDER — PREDNISONE 20 MG PO TABS
20.0000 mg | ORAL_TABLET | Freq: Every day | ORAL | Status: DC
  Administered 2019-11-30: 20 mg via ORAL
  Filled 2019-11-29: qty 1

## 2019-11-29 MED ORDER — LOPERAMIDE HCL 2 MG PO CAPS
2.0000 mg | ORAL_CAPSULE | ORAL | Status: DC | PRN
  Administered 2019-11-29 – 2019-11-30 (×2): 2 mg via ORAL
  Filled 2019-11-29 (×3): qty 1

## 2019-11-29 MED ORDER — ZINC OXIDE 40 % EX OINT
TOPICAL_OINTMENT | Freq: Three times a day (TID) | CUTANEOUS | Status: DC | PRN
  Filled 2019-11-29: qty 113
  Filled 2019-11-29: qty 226

## 2019-11-29 NOTE — Progress Notes (Addendum)
PROGRESS NOTE    Darlene Stafford   ERD:[id number]  DOB: [birth year]  PCP: Katherina Mires, MD    DOA: 11/27/2019 LOS: 2   Brief Narrative   Darlene Stafford is a 69 y.o. female with medical history of moderate persistent asthma, acquired hypothyroidism, allergic rhinitis, stage IIIa CKD (baseline Cr 1.1-1.3), type 2 diabetes mellitus (last A1c 5.0%), sarcoidosis.  Admitted to on 11/27/2019 with acute metabolic encephalopathy after presenting to the ED from home for evaluation of confusion.  Evaluation in the ED was consistent with severe sepsis secondary to community-acquitted pneumonia.        Assessment & Plan   Principal Problem:   Acute metabolic encephalopathy Active Problems:   Diabetes mellitus without complication ([place])   Hypothyroidism   CAP (community acquired pneumonia)   Asthma   Severe sepsis ([place])   Fever   Generalized weakness   Severe sepsis due to Community-acquired pneumonia - POA. Severe sepsis as evidenced by tachycardia, leukocytosis and xray consistent with pneumonia, encephalopathy reflects organ dysfunction.  Presented with confusion, does endorse recent cough.  Lactic acid normal. --Continue Rocephin and Zithromax (10/25 >>) --Follow up cultures, urinary antigens --AM CBC, CMP --on IV fluids as below --Tylenol PRN fever or mild pain --Bronchodilators --Will consider pulmonology consult if not improving, given underlying asthma and sarcoidosis  Acute metabolic encephalopathy - present on admission, likely due to infection.  Improved and seems at her baseline.  --continue to treat infection as above --fall precautions  Diarrhea - negative for C diff.  PRN Imodium.  Monitor BP and electrolytes.  Hypoglycemia - continue D5-NS @ 125 cc/hr.  Close BG monitoring. Hypoglycemia protocol.  Hypomagnesemia - Mg 1.5 on admission, replaced.  Monitor and further replacement as needed.  Rhabdomyolysis - presented with CK of 577 and AKI.  On IV fluids as above.   Repeat CK level with AM labs.  CK up today at 836   Acute kidney injury - present on admission.  Cr 1.33 >> 1.53>>2.04 today (dropped BP overnight).  This is likely prerenal azotemia, rhabdo, possibly ATN.  IV fluids as above. Likely due to infection/sepsis and rhabdo.   --renal ultrasound to rule out obstruction --monitor BMP  Moderate persistent asthma - Continue inhalers, Singulair, low dose daily prednisone increased to 20 mg. Follows at [facility] pulmonology with Dr. Halford Chessman.   Currently asthma does not appear exacerbated.  No wheezing on exam.  Hx of sarcoidosis - consider pulmonology consult  On prednisone 2.5 daily outpatient.  Increased to 20 mg prednisone daily.  Consider higher dose steroids if not improving.  ?Pulm consult.  Type 2 diabetes - currently with hypoglycemia on D5-LR.  Hold insulin for now.  Place on sliding scale Novolog when indicated. Patient not on oral meds or insulin at home currently.  Last A1c in Feb 2021 was 5.0%. Repeat A1c pending.  Acquired Hypothyroidism - continue levothyroxine.  TSH normal.  Allergic rhinitis - continue Flonase   Obesity: Body mass index is 43.27 kg/m.  Complicates overall care and prognosis.    DVT prophylaxis: enoxaparin (LOVENOX) injection 40 mg Start: 11/28/19 0800 SCDs Start: 11/27/19 1531   Diet:  Diet Orders (From admission, onward)    Start     Ordered   11/28/19 1250  Diet heart healthy/carb modified Room service appropriate? Yes; Fluid consistency: Thin  Diet effective now       Question Answer Comment  Diet-HS Snack? Nothing   Room service appropriate? Yes   Fluid consistency: Thin  11/28/19 1249            Code Status: Full Code    Subjective 11/29/19    Pt seen this AM at bedside.  She says she wants to go home, and gets upset when told she's not quite ready yet.  She reportedly vomited 3 times overnight.  Had hypotension and got 500 cc bolus fluids with improvement.  Says she feels weak and very  tired.  Reports still having nausea this AM.   Disposition Plan & Communication   Status is: Inpatient  Remains inpatient appropriate because:IV treatments appropriate due to intensity of illness or inability to take PO.  Continuing IV antibiotics while awaiting clinical improvement. Patient hypotensive, worsened renal function, and now on oxygen.   Dispo: The patient is from: Home              Anticipated d/c is to: Home              Anticipated d/c date is: 3 days              Patient currently is not medically stable to d/c.    Family Communication: none at bedside, will attempt to call this afternoon    Consults, Procedures, Significant Events   Consultants:   None  Procedures:   None  Antimicrobials:  Anti-infectives (From admission, onward)   Start     Dose/Rate Route Frequency Ordered Stop   11/28/19 1400  azithromycin (ZITHROMAX) 500 mg in sodium chloride 0.9 % 250 mL IVPB        500 mg 250 mL/hr over 60 Minutes Intravenous Every 24 hours 11/27/19 1539     11/28/19 1400  cefTRIAXone (ROCEPHIN) 1 g in sodium chloride 0.9 % 100 mL IVPB        1 g 200 mL/hr over 30 Minutes Intravenous Every 24 hours 11/27/19 1539     11/27/19 1330  cefTRIAXone (ROCEPHIN) 1 g in sodium chloride 0.9 % 100 mL IVPB        1 g 200 mL/hr over 30 Minutes Intravenous  Once 11/27/19 1318 11/27/19 1441   11/27/19 1330  azithromycin (ZITHROMAX) 500 mg in sodium chloride 0.9 % 250 mL IVPB        500 mg 250 mL/hr over 60 Minutes Intravenous  Once 11/27/19 1318 11/27/19 1539         Objective   Vitals:   11/29/19 0253 11/29/19 0410 11/29/19 0736 11/29/19 1738  BP: 95/71  (!) 112/41 91/68  Pulse: 93  86 79  Resp:   16   Temp: 98.9 F (37.2 C)  98.9 F (37.2 C) 98.2 F (36.8 C)  TempSrc: Oral  Oral Oral  SpO2: 100%  100%   Weight:  103.9 kg    Height:        Intake/Output Summary (Last 24 hours) at 11/29/2019 2151 Last data filed at 11/29/2019 7824 Gross per 24 hour  Intake  2462.07 ml  Output --  Net 2462.07 ml   Filed Weights   11/27/19 1259 11/29/19 0410  Weight: 84.8 kg 103.9 kg    Physical Exam:  General exam: awake, alert, no acute distress, obese, mildly ill-appearing Respiratory system: right side with rhonchi, left side clear, no wheezes, normal respiratory effort. On 2 L/min oxygen by nasal cannula. Cardiovascular system: normal S1/S2, RRR, no pedal edema.   Gastrointestinal system: soft, NT, ND, no guarding  Central nervous system: no gross focal neurologic deficits, normal speech Extremities: moves all, no edema, normal  tone Psychiatry: normal mood, congruent affect  Labs   Data Reviewed: I have personally reviewed following labs and imaging studies  CBC: Recent Labs  Lab 11/27/19 1258 11/28/19 0456 11/29/19 0142  WBC 11.5* 11.8* 7.8  NEUTROABS 6.6 6.2 5.2  HGB 13.8 13.0 10.3*  HCT 40.2 36.8 30.8*  MCV 89.1 88.5 91.4  PLT 399 335 093   Basic Metabolic Panel: Recent Labs  Lab 11/27/19 1258 11/27/19 1536 11/28/19 0456 11/29/19 0142  NA 138  --  135 135  K 3.7  --  3.8 3.8  CL 101  --  102 102  CO2 29  --  23 25  GLUCOSE 120*  --  101* 133*  BUN 9  --  11 15  CREATININE 1.33*  --  1.53* 2.04*  CALCIUM 9.0  --  8.4* 8.0*  MG  --  1.5* 1.8 1.7   GFR: Estimated Creatinine Clearance: 28.8 mL/min (A) (by C-G formula based on SCr of 2.04 mg/dL (H)). Liver Function Tests: Recent Labs  Lab 11/27/19 1258 11/28/19 0456 11/29/19 0142  AST 22 38 38  ALT 11 15 16   ALKPHOS 80 69 61  BILITOT 1.1 1.6* 1.0  PROT 7.5 6.3* 5.9*  ALBUMIN 4.0 3.2* 3.0*   No results for input(s): LIPASE, AMYLASE in the last 168 hours. No results for input(s): AMMONIA in the last 168 hours. Coagulation Profile: Recent Labs  Lab 11/27/19 1258  INR 1.0   Cardiac Enzymes: Recent Labs  Lab 11/28/19 0456 11/29/19 0142  CKTOTAL 577* 838*   BNP (last 3 results) No results for input(s): PROBNP in the last 8760 hours. HbA1C: Recent Labs     11/28/19 0456  HGBA1C 5.3   CBG: Recent Labs  Lab 11/29/19 0351 11/29/19 0736 11/29/19 1136 11/29/19 1713 11/29/19 2005  GLUCAP 130* 118* 127* 101* 119*   Lipid Profile: No results for input(s): CHOL, HDL, LDLCALC, TRIG, CHOLHDL, LDLDIRECT in the last 72 hours. Thyroid Function Tests: Recent Labs    11/28/19 0456  TSH 3.017   Anemia Panel: No results for input(s): VITAMINB12, FOLATE, FERRITIN, TIBC, IRON, RETICCTPCT in the last 72 hours. Sepsis Labs: Recent Labs  Lab 11/27/19 1258 11/27/19 1536 11/28/19 2321 11/29/19 0142  PROCALCITON  --  <0.10  --   --   LATICACIDVEN 1.4  --  2.0* 1.8    Recent Results (from the past 240 hour(s))  Culture, blood (Routine x 2)     Status: None (Preliminary result)   Collection Time: 11/27/19 12:58 PM   Specimen: BLOOD  Result Value Ref Range Status   Specimen Description BLOOD RIGHT ANTECUBITAL  Final   Special Requests   Final    BOTTLES DRAWN AEROBIC AND ANAEROBIC Blood Culture results may not be optimal due to an excessive volume of blood received in culture bottles   Culture   Final    NO GROWTH 2 DAYS Performed at [facility], [address], [place], [place] [address]    Report Status PENDING  Incomplete  Urine culture     Status: None   Collection Time: 11/27/19  1:54 PM   Specimen: Urine, Random  Result Value Ref Range Status   Specimen Description   Final    URINE, RANDOM Performed at [facility], [address], [place], [place] [address]    Special Requests   Final    NONE Performed at [facility], [address], [place], [place] [address]    Culture   Final  NO GROWTH Performed at [facility], [place] [address] [place], [place] [address]    Report Status 11/28/2019 FINAL  Final  Respiratory Panel by RT PCR (Flu A&B, Covid) - Nasopharyngeal Swab     Status: None   Collection Time: 11/27/19  1:54 PM   Specimen: Nasopharyngeal Swab  Result Value Ref Range Status    SARS Coronavirus 2 by RT PCR NEGATIVE NEGATIVE Final    Comment: (NOTE) SARS-CoV-2 target nucleic acids are NOT DETECTED.  The SARS-CoV-2 RNA is generally detectable in upper respiratoy specimens during the acute phase of infection. The lowest concentration of SARS-CoV-2 viral copies this assay can detect is 131 copies/mL. A negative result does not preclude SARS-Cov-2 infection and should not be used as the sole basis for treatment or other patient management decisions. A negative result may occur with  improper specimen collection/handling, submission of specimen other than nasopharyngeal swab, presence of viral mutation(s) within the areas targeted by this assay, and inadequate number of viral copies (<131 copies/mL). A negative result must be combined with clinical observations, patient history, and epidemiological information. The expected result is Negative.  Fact Sheet for Patients:  PinkCheek.be  Fact Sheet for Healthcare Providers:  GravelBags.it  This test is no t yet approved or cleared by the [place] FDA and  has been authorized for detection and/or diagnosis of SARS-CoV-2 by FDA under an Emergency Use Authorization (EUA). This EUA will remain  in effect (meaning this test can be used) for the duration of the COVID-19 declaration under Section 564(b)(1) of the Act, 21 U.S.C. section 360bbb-3(b)(1), unless the authorization is terminated or revoked sooner.     Influenza A by PCR NEGATIVE NEGATIVE Final   Influenza B by PCR NEGATIVE NEGATIVE Final    Comment: (NOTE) The Xpert Xpress SARS-CoV-2/FLU/RSV assay is intended as an aid in  the diagnosis of influenza from Nasopharyngeal swab specimens and  should not be used as a sole basis for treatment. Nasal washings and  aspirates are unacceptable for Xpert Xpress SARS-CoV-2/FLU/RSV  testing.  Fact Sheet for  Patients: PinkCheek.be  Fact Sheet for Healthcare Providers: GravelBags.it  This test is not yet approved or cleared by the [place] FDA and  has been authorized for detection and/or diagnosis of SARS-CoV-2 by  FDA under an Emergency Use Authorization (EUA). This EUA will remain  in effect (meaning this test can be used) for the duration of the  Covid-19 declaration under Section 564(b)(1) of the Act, 21  U.S.C. section 360bbb-3(b)(1), unless the authorization is  terminated or revoked. Performed at [facility], [address] [place], [place] [address]   Culture, blood (Routine x 2)     Status: None (Preliminary result)   Collection Time: 11/27/19  2:15 PM   Specimen: BLOOD  Result Value Ref Range Status   Specimen Description BLOOD [facility]  Final   Special Requests   Final    BOTTLES DRAWN AEROBIC AND ANAEROBIC Blood Culture adequate volume   Culture   Final    NO GROWTH 2 DAYS Performed at [facility], [address], [place], [place] [address]    Report Status PENDING  Incomplete  Gastrointestinal Panel by PCR , Stool     Status: None   Collection Time: 11/28/19  1:45 PM   Specimen: Stool  Result Value Ref Range Status   Campylobacter species NOT DETECTED NOT DETECTED Final   Plesimonas shigelloides NOT DETECTED NOT DETECTED Final   Salmonella species NOT DETECTED  NOT DETECTED Final   Yersinia enterocolitica NOT DETECTED NOT DETECTED Final   Vibrio species NOT DETECTED NOT DETECTED Final   Vibrio cholerae NOT DETECTED NOT DETECTED Final   Enteroaggregative E coli (EAEC) NOT DETECTED NOT DETECTED Final   Enteropathogenic E coli (EPEC) NOT DETECTED NOT DETECTED Final   Enterotoxigenic E coli (ETEC) NOT DETECTED NOT DETECTED Final   Shiga like toxin producing E coli (STEC) NOT DETECTED NOT DETECTED Final   Shigella/Enteroinvasive E coli (EIEC) NOT DETECTED NOT DETECTED Final   Cryptosporidium  NOT DETECTED NOT DETECTED Final   Cyclospora cayetanensis NOT DETECTED NOT DETECTED Final   Entamoeba histolytica NOT DETECTED NOT DETECTED Final   Giardia lamblia NOT DETECTED NOT DETECTED Final   Adenovirus F40/41 NOT DETECTED NOT DETECTED Final   Astrovirus NOT DETECTED NOT DETECTED Final   Norovirus GI/GII NOT DETECTED NOT DETECTED Final   Rotavirus A NOT DETECTED NOT DETECTED Final   Sapovirus (I, II, IV, and V) NOT DETECTED NOT DETECTED Final    Comment: Performed at [facility], [address] [place], [place] [address]  C Difficile Quick Screen w PCR reflex     Status: None   Collection Time: 11/28/19  4:18 PM   Specimen: STOOL  Result Value Ref Range Status   C Diff antigen NEGATIVE NEGATIVE Final   C Diff toxin NEGATIVE NEGATIVE Final   C Diff interpretation No C. difficile detected.  Final    Comment: Performed at [facility], [address], [place], [place] [address]      Imaging Studies   US RENAL  Result Date: Dec 10, 2019 CLINICAL DATA:  Inpatient.  Acute kidney injury. EXAM: RENAL / URINARY TRACT ULTRASOUND COMPLETE COMPARISON:  04/12/2018 renal sonogram. 02/03/2017 unenhanced CT abdomen/pelvis. FINDINGS: Right Kidney: Renal measurements: 5.9 x 3.5 x 3.1 cm = volume: 33 mL. Echogenic atrophic right kidney. No stones large enough to cause acoustic shadowing. Mild caliectasis in the lower right renal collecting system. No right renal masses. Left Kidney: Renal measurements: 9.7 x 5.1 x 5.9 cm = volume: 151 mL. Echogenic left renal parenchyma, normal thickness. No hydronephrosis. No left renal mass. Indistinct echogenic focus in perinephric region in the interpolar left kidney correlates with site of focal renal cortical scarring seen on 02/03/2017 CT abdomen/pelvis study. Bladder: Appears normal for degree of bladder distention. Other: None. IMPRESSION: 1. Atrophic right kidney. Mild caliectasis in the lower right renal collecting system without  pelviectasis. 2. Renal cortical scarring in the interpolar left kidney, with overall normal size of the left kidney. No left hydronephrosis. 3. Echogenic kidneys bilaterally indicative of nonspecific renal parenchymal disease of uncertain chronicity, presumably acute or acute on chronic. 4. Normal bladder. Electronically Signed   By: Ilona Sorrel M.D.   On: Dec 10, 2019 10:55     Medications   Scheduled Meds: . enoxaparin (LOVENOX) injection  40 mg Subcutaneous Q24H  . fluticasone  2 spray Each Nare Daily  . fluticasone  2 puff Inhalation BID  . insulin aspart  0-6 Units Subcutaneous Q4H  . levothyroxine  25 mcg Oral Q0600  . montelukast  10 mg Oral QHS  . predniSONE  2.5 mg Oral Q0600   Continuous Infusions: . azithromycin 500 mg (Dec 10, 2019 1539)  . cefTRIAXone (ROCEPHIN)  IV 1 g (December 10, 2019 1505)  . dextrose 5 % and 0.9% NaCl 125 mL/hr at 2019/12/10 1458       LOS: 2 days    Time spent: 30 minutes    Ezekiel Slocumb, DO  [facility]  11/29/2019, 9:51 PM    If 7PM-7AM, please contact night-coverage. How to contact the [facility] Attending or Consulting provider [place] or covering provider during after hours [place], for this patient?    1. Check the care team in [facility] and look for a) attending/consulting [facility] provider listed and b) the [facility] team listed 2. Log into [email or web address] and use [place]'s universal password to access. If you do not have the password, please contact the hospital operator. 3. Locate the [facility] provider you are looking for under [facility] and page to a number that you can be directly reached. 4. If you still have difficulty reaching the provider, please page the [facility] (Director on Call) for the Hospitalists listed on amion for assistance.

## 2019-11-29 NOTE — Plan of Care (Signed)
  Problem: Education: Goal: Knowledge of General Education information will improve Description Including pain rating scale, medication(s)/side effects and non-pharmacologic comfort measures Outcome: Progressing   Problem: Health Behavior/Discharge Planning: Goal: Ability to manage health-related needs will improve Outcome: Progressing   

## 2019-11-29 NOTE — Evaluation (Signed)
Physical Therapy Evaluation Patient Details Name: Darlene Stafford MRN: [id number] DOB: 06-28-1950 Today's Date: 11/29/2019   History of Present Illness  presented to ER secondary to AMS, nausea/vomiting x1-2 days; admitted for management of acute metabolic encephalopathy.  Clinical Impression  Patient resting in bed upon arrival to session; recently completing peri-care/hygiene with CNA.  Endorses continued nausea (improved from previous date), but requires extensive encouragement for participation with session.  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  Currently requiring mod assist for bed mobility, min/close sup for unsupported sitting balance. Mild R lateral lean in sitting, but no overt LOB.  Patient repeatedly attempting return to supine throughout session; refused further attempts at standing, OOB or gait. Unable to redirect despite encouragement.  Will continue to assess/progress in subsequent sessions. Would benefit from skilled PT to address above deficits and promote optimal return to PLOF.; recommend transition to STR upon discharge from acute hospitalization.     Follow Up Recommendations SNF    Equipment Recommendations       Recommendations for Other Services       Precautions / Restrictions Precautions Precautions: Fall Restrictions Weight Bearing Restrictions: No      Mobility  Bed Mobility Overal bed mobility: Needs Assistance Bed Mobility: Supine to Sit;Sit to Supine     Supine to sit: Mod assist Sit to supine: Min assist;Mod assist   General bed mobility comments: significant assist for truncal elevation; limited active effort with movement transition, consistently attempting spontaneous return to supine throughout session    Transfers                 General transfer comment: patient refused  Ambulation/Gait             General Gait Details: patient refused  Stairs             Wheelchair Mobility    Modified Rankin (Stroke Patients Only)       Balance Overall balance assessment: Needs assistance Sitting-balance support: No upper extremity supported;Feet supported Sitting balance-Leahy Scale: Fair                                       Pertinent Vitals/Pain Pain Assessment: No/denies pain    Home Living Family/patient expects to be discharged to:: Private residence Living Arrangements: Children Available Help at Discharge: Family;Available 24 hours/day Type of Home: House Home Access: Stairs to enter   [employer] of Steps: 3 Home Layout: Two level;Able to live on main level with bedroom/bathroom        Prior Function Level of Independence: Needs assistance   Gait / Transfers Assistance Needed: Ambulatory with RW for ADLs, household mobilization per patient; questionable historian-will verify with family as available           Hand Dominance        Extremity/Trunk Assessment   Upper Extremity Assessment Upper Extremity Assessment:  (R UE grossly 3-/5 throughout  (baseline hemiparesis, prior CVA), R shoulder elevaiton limited approx 70-80 degrees; L UE grossly 4/5)    Lower Extremity Assessment Lower Extremity Assessment:  (R LE grossly 3+ to 4-/5 throughout (baseline hemiparesis, prior CVA); L LE grossly 4+/5)       Communication      Cognition Arousal/Alertness: Awake/alert Behavior During Therapy: WFL for tasks assessed/performed  General Comments: oriented to self, location; follows simple commands, but requires significant encouragement for participation with task (consistently attempting return to supine)      General Comments      Exercises     Assessment/Plan    PT Assessment Patient needs continued PT services  PT Problem List Decreased strength;Decreased range of motion;Decreased activity tolerance;Decreased balance;Decreased  mobility;Decreased knowledge of use of DME;Decreased cognition;Decreased safety awareness;Decreased knowledge of precautions;Obesity       PT Treatment Interventions DME instruction;Gait training;Stair training;Functional mobility training;Therapeutic activities;Therapeutic exercise;Balance training;Patient/family education;Cognitive remediation    PT Goals (Current goals can be found in the Care Plan section)  Acute Rehab PT Goals Patient Stated Goal: to lay back down PT Goal Formulation: With patient Time For Goal Achievement: 12/13/19 Potential to Achieve Goals: Good    Frequency Min 2X/week   Barriers to discharge Decreased caregiver support      Co-evaluation               AM-PAC PT "6 Clicks" Mobility  Outcome Measure Help needed turning from your back to your side while in a flat bed without using bedrails?: A Little Help needed moving from lying on your back to sitting on the side of a flat bed without using bedrails?: A Lot Help needed moving to and from a bed to a chair (including a wheelchair)?: A Lot Help needed standing up from a chair using your arms (e.g., wheelchair or bedside chair)?: A Lot Help needed to walk in hospital room?: Total Help needed climbing 3-5 steps with a railing? : Total 6 Click Score: 11    End of Session Equipment Utilized During Treatment: Gait belt Activity Tolerance: Patient limited by fatigue (Limited by lethargy) Patient left: in bed;with call bell/phone within reach;with bed alarm set Nurse Communication: Mobility status PT Visit Diagnosis: Muscle weakness (generalized) (M62.81);Difficulty in walking, not elsewhere classified (R26.2)    Time: 1340-1355 PT Time Calculation (min) (ACUTE ONLY): 15 min   Charges:   PT Evaluation $PT Eval Moderate Complexity: 1 Mod          Tawan Degroote H. Owens Shark, PT, DPT, NCS 11/29/19, 2:10 PM [phone]

## 2019-11-30 DIAGNOSIS — J45901 Unspecified asthma with (acute) exacerbation: Secondary | ICD-10-CM | POA: Diagnosis not present

## 2019-11-30 DIAGNOSIS — G9341 Metabolic encephalopathy: Secondary | ICD-10-CM | POA: Diagnosis not present

## 2019-11-30 DIAGNOSIS — N179 Acute kidney failure, unspecified: Secondary | ICD-10-CM

## 2019-11-30 DIAGNOSIS — A419 Sepsis, unspecified organism: Secondary | ICD-10-CM | POA: Diagnosis not present

## 2019-11-30 LAB — COMPREHENSIVE METABOLIC PANEL
ALT: 15 U/L (ref 0–44)
AST: 27 U/L (ref 15–41)
Albumin: 3 g/dL — ABNORMAL LOW (ref 3.5–5.0)
Alkaline Phosphatase: 58 U/L (ref 38–126)
Anion gap: 7 (ref 5–15)
BUN: 9 mg/dL (ref 8–23)
CO2: 24 mmol/L (ref 22–32)
Calcium: 8 mg/dL — ABNORMAL LOW (ref 8.9–10.3)
Chloride: 108 mmol/L (ref 98–111)
Creatinine, Ser: 1.2 mg/dL — ABNORMAL HIGH (ref 0.44–1.00)
GFR, Estimated: 49 mL/min — ABNORMAL LOW (ref >60)
Glucose, Bld: 125 mg/dL — ABNORMAL HIGH (ref 70–99)
Potassium: 3.6 mmol/L (ref 3.5–5.1)
Sodium: 139 mmol/L (ref 135–145)
Total Bilirubin: 0.4 mg/dL (ref 0.3–1.2)
Total Protein: 6.1 g/dL — ABNORMAL LOW (ref 6.5–8.1)

## 2019-11-30 LAB — CBC WITH DIFFERENTIAL/PLATELET
Abs Immature Granulocytes: 0.03 10*3/uL (ref 0.00–0.07)
Basophils Absolute: 0 10*3/uL (ref 0.0–0.1)
Basophils Relative: 0 %
Eosinophils Absolute: 0.3 10*3/uL (ref 0.0–0.5)
Eosinophils Relative: 6 %
HCT: 26.9 % — ABNORMAL LOW (ref 36.0–46.0)
Hemoglobin: 9.1 g/dL — ABNORMAL LOW (ref 12.0–15.0)
Immature Granulocytes: 1 %
Lymphocytes Relative: 14 %
Lymphs Abs: 0.7 10*3/uL (ref 0.7–4.0)
MCH: 31 pg (ref 26.0–34.0)
MCHC: 33.8 g/dL (ref 30.0–36.0)
MCV: 91.5 fL (ref 80.0–100.0)
Monocytes Absolute: 0.5 10*3/uL (ref 0.1–1.0)
Monocytes Relative: 11 %
Neutro Abs: 3.2 10*3/uL (ref 1.7–7.7)
Neutrophils Relative %: 68 %
Platelets: 210 10*3/uL (ref 150–400)
RBC: 2.94 MIL/uL — ABNORMAL LOW (ref 3.87–5.11)
RDW: 12.2 % (ref 11.5–15.5)
WBC: 4.7 10*3/uL (ref 4.0–10.5)
nRBC: 0 % (ref 0.0–0.2)

## 2019-11-30 LAB — GLUCOSE, CAPILLARY
Glucose-Capillary: 107 mg/dL — ABNORMAL HIGH (ref 70–99)
Glucose-Capillary: 122 mg/dL — ABNORMAL HIGH (ref 70–99)
Glucose-Capillary: 129 mg/dL — ABNORMAL HIGH (ref 70–99)

## 2019-11-30 LAB — MAGNESIUM: Magnesium: 1.9 mg/dL (ref 1.7–2.4)

## 2019-11-30 MED ORDER — AZITHROMYCIN 500 MG PO TABS
500.0000 mg | ORAL_TABLET | Freq: Every day | ORAL | Status: DC
  Administered 2019-11-30: 500 mg via ORAL
  Filled 2019-11-30: qty 1

## 2019-11-30 MED ORDER — AMOXICILLIN-POT CLAVULANATE 875-125 MG PO TABS: 1.0000 | ORAL_TABLET | Freq: Two times a day (BID) | ORAL | 0 refills | Status: DC

## 2019-11-30 MED ORDER — ONDANSETRON HCL 4 MG PO TABS: 4.0000 mg | ORAL_TABLET | Freq: Three times a day (TID) | ORAL | 0 refills | Status: AC | PRN

## 2019-11-30 MED ORDER — AZITHROMYCIN 500 MG PO TABS: 500.0000 mg | ORAL_TABLET | Freq: Every day | ORAL | 0 refills | Status: AC

## 2019-11-30 NOTE — Care Management Important Message (Signed)
Important Message  Patient Details  Name: Darlene Stafford MRN: [id number] Date of Birth: 1950-06-05   Medicare Important Message Given:  N/A - LOS <3 / Initial given by admissions     Juliann Pulse A Shahed Yeoman 11/30/2019, 7:40 AM

## 2019-11-30 NOTE — TOC Initial Note (Signed)
Transition of Care [facility]) - Initial/Assessment Note    Patient Details  Name: Darlene Stafford MRN: [id number] Date of Birth: May 20, 1950  Transition of Care [facility]) CM/SW Contact:    Shelbie Ammons, RN Phone Number: 11/30/2019, 8:25 AM  Clinical Narrative:   RNCM met with patient in room. Patient resting in bed upon arrival however verbally responsive and answering questions appropriately.  RNCM discussed current recommendations for SNF at discharge however patient reports she is going home. Patient reports that she lives at home with her granddaughter and grandson in law and that she has everything she needs right in her room. She reports that she is able to complete her ADLs on her own and her granddaughter cooks however she does have a microwave and refrigerator in her room. Discussed setting up home health services however patient at this time does not think she needs them. She has a rolling walker and shower chair and does not believe she needs any more equipment.                Expected Discharge Plan: Home/Self Care Barriers to Discharge: Continued Medical Work up   Patient Goals and CMS Choice        Expected Discharge Plan and Services Expected Discharge Plan: Home/Self Care       Living arrangements for the past 2 months: Single Family Home                                      Prior Living Arrangements/Services Living arrangements for the past 2 months: Single Family Home Lives with:: Adult Children   Do you feel safe going back to the place where you live?: Yes        Care giver support system in place?: Yes (comment)      Activities of Daily Living      Permission Sought/Granted                  Emotional Assessment Appearance:: Appears stated age Attitude/Demeanor/Rapport: Guarded Affect (typically observed): Appropriate Orientation: : Oriented to  Time, Oriented to Situation, Oriented to Place, Oriented to Self Alcohol / Substance Use: Not  Applicable Psych Involvement: No (comment)  Admission diagnosis:  Acute metabolic encephalopathy [G29.52] Sepsis, due to unspecified organism, unspecified whether acute organ dysfunction present [facility]) [A41.9] Patient Active Problem List   Diagnosis Date Noted   Acute metabolic encephalopathy 84/13/2440   Severe sepsis ([place]) 11/27/2019   Fever 11/27/2019   Generalized weakness 11/27/2019   Clostridioides difficile infection 03/22/2019   Hypothyroidism 03/22/2019   GERD (gastroesophageal reflux disease) 03/22/2019   Asthma exacerbation 03/21/2019   Pressure injury of skin 04/14/2018   Acute bronchitis 04/30/2017   Hemoptysis 04/30/2017   Sarcoidosis 02/03/2017   Morbid obesity due to excess calories ([place])    CAP (community acquired pneumonia) 04/10/2015   Diabetes mellitus without complication ([place]) 11/30/2534   Asthma 04/10/2015   Acute encephalopathy 04/10/2015   UTI (lower urinary tract infection) 04/10/2015   AKI (acute kidney injury) ([place]) 04/10/2015   Sepsis ([place]) 04/10/2015   Hypotension 04/10/2015   PCP:  Katherina Mires, MD Pharmacy:   [facility] [address], [place] [address] Phone: [phone] Fax: [phone]  [facility] [address], [place] - [place] [address] Phone: [phone] Fax: [phone]  Social Determinants of Health (SDOH) Interventions    Readmission Risk Interventions No flowsheet data found.

## 2019-11-30 NOTE — Discharge Instructions (Signed)
Community-Acquired Pneumonia, Adult Pneumonia is an infection of the lungs. It causes swelling in the airways of the lungs. Mucus and fluid may also build up inside the airways. One type of pneumonia can happen while a person is in a hospital. A different type can happen when a person is not in a hospital (community-acquired pneumonia).  What are the causes?  This condition is caused by germs (viruses, bacteria, or fungi). Some types of germs can be passed from one person to another. This can happen when you breathe in droplets from the cough or sneeze of an infected person. What increases the risk? You are more likely to develop this condition if you:  Have a long-term (chronic) disease, such as: ? Chronic obstructive pulmonary disease (COPD). ? Asthma. ? Cystic fibrosis. ? Congestive heart failure. ? Diabetes. ? Kidney disease.  Have HIV.  Have sickle cell disease.  Have had your spleen removed.  Do not take good care of your teeth and mouth (poor dental hygiene).  Have a medical condition that increases the risk of breathing in droplets from your own mouth and nose.  Have a weakened body defense system (immune system).  Are a smoker.  Travel to areas where the germs that cause this illness are common.  Are around certain animals or the places they live. What are the signs or symptoms?  A dry cough.  A wet (productive) cough.  Fever.  Sweating.  Chest pain. This often happens when breathing deeply or coughing.  Fast breathing or trouble breathing.  Shortness of breath.  Shaking chills.  Feeling tired (fatigue).  Muscle aches. How is this treated? Treatment for this condition depends on many things. Most adults can be treated at home. In some cases, treatment must happen in a hospital. Treatment may include:  Medicines given by mouth or through an IV tube.  Being given extra oxygen.  Respiratory therapy. In rare cases, treatment for very bad pneumonia  may include:  Using a machine to help you breathe.  Having a procedure to remove fluid from around your lungs. Follow these instructions at home: Medicines  Take over-the-counter and prescription medicines only as told by your doctor. ? Only take cough medicine if you are losing sleep.  If you were prescribed an antibiotic medicine, take it as told by your doctor. Do not stop taking the antibiotic even if you start to feel better. General instructions   Sleep with your head and neck raised (elevated). You can do this by sleeping in a recliner or by putting a few pillows under your head.  Rest as needed. Get at least 8 hours of sleep each night.  Drink enough water to keep your pee (urine) pale yellow.  Eat a healthy diet that includes plenty of vegetables, fruits, whole grains, low-fat dairy products, and lean protein.  Do not use any products that contain nicotine or tobacco. These include cigarettes, e-cigarettes, and chewing tobacco. If you need help quitting, ask your doctor.  Keep all follow-up visits as told by your doctor. This is important. How is this prevented? A shot (vaccine) can help prevent pneumonia. Shots are often suggested for:  People older than 69 years of age.  People older than 69 years of age who: ? Are having cancer treatment. ? Have long-term (chronic) lung disease. ? Have problems with their body's defense system. You may also prevent pneumonia if you take these actions:  Get the flu (influenza) shot every year.  Go to the dentist as   often as told.  Wash your hands often. If you cannot use soap and water, use hand sanitizer. Contact a doctor if:  You have a fever.  You lose sleep because your cough medicine does not help. Get help right away if:  You are short of breath and it gets worse.  You have more chest pain.  Your sickness gets worse. This is very serious if: ? You are an older adult. ? Your body's defense system is weak.  You  cough up blood. Summary  Pneumonia is an infection of the lungs.  Most adults can be treated at home. Some will need treatment in a hospital.  Drink enough water to keep your pee pale yellow.  Get at least 8 hours of sleep each night. This information is not intended to replace advice given to you by your health care provider. Make sure you discuss any questions you have with your health care provider. Document Revised: 05/12/2018 Document Reviewed: 09/17/2017 Elsevier Patient Education  2020 Elsevier Inc.  

## 2019-11-30 NOTE — TOC Progression Note (Signed)
Transition of Care [facility]) - Progression Note    Patient Details  Name: Darlene Stafford MRN: [id number] Date of Birth: 27-Apr-1950  Transition of Care [facility]) CM/SW [place], RN Phone Number: 11/30/2019, 2:33 PM  Clinical Narrative:   Per patient, family and Dr. Manuella Ghazi she is now agreeable to home health. Reached out to [place] with [facility] and she will accept referral.     Expected Discharge Plan: Home/Self Care Barriers to Discharge: Continued Medical Work up  Expected Discharge Plan and Services Expected Discharge Plan: Home/Self Care       Living arrangements for the past 2 months: Single Family Home Expected Discharge Date: 11/30/19                                     Social Determinants of Health (SDOH) Interventions    Readmission Risk Interventions No flowsheet data found.

## 2019-11-30 NOTE — Progress Notes (Signed)
Physical Therapy Treatment Patient Details Name: Verlaine Embry MRN: [id number] DOB: Aug 31, 1950 Today's Date: 11/30/2019    History of Present Illness presented to ER secondary to AMS, nausea/vomiting x1-2 days; admitted for management of acute metabolic encephalopathy.    PT Comments    Pt seen this am, pleasant mood and agreeable to skilled PT services.  Pt able to transfer supine to sit EOB with HOB raised and use of side rail with CGA, increased time, and vc's for technique.  Sitting EOB unsupported x 3 minutes.  Sit to stand to RW with CG/MinA. Short distance gait training with RW, CGA, x 4 feet. Pt encouraged to increase gait distance, however declined despite education. Pt completed sit to stand from chair, static standing with B UE support x 30 seconds prior to fatiguing. Seated B LE strengthening exercises with repeated verbal cues and visual aid to complete.  Increased tolerance for activity today. Pt anxious to return home, however may benefit from SNF to maximize safe functional mobility. Pt left up in chair with chair alarm engaged, call bell in reach, all needs met. Pt educated not to get up without assistance, with good understanding.  Follow Up Recommendations  SNF     Equipment Recommendations       Recommendations for Other Services       Precautions / Restrictions Precautions Precautions: Fall Restrictions Weight Bearing Restrictions: No    Mobility  Bed Mobility Overal bed mobility: Needs Assistance Bed Mobility: Supine to Sit;Sit to Supine     Supine to sit: Min assist;HOB elevated     General bed mobility comments: Pt slow moving, but able to complete task with less assistance  Transfers Overall transfer level: Needs assistance Equipment used: Rolling walker (2 wheeled) Transfers: Sit to/from Stand Sit to Stand: Min assist         General transfer comment:  (vebal cues for hand placement)  Ambulation/Gait Ambulation/Gait assistance: Min  guard Gait Distance (Feet):  (4 feet) Assistive device: Rolling walker (2 wheeled) Gait Pattern/deviations: Shuffle     General Gait Details:  (Pt refused further gait training)   Stairs             Wheelchair Mobility    Modified Rankin (Stroke Patients Only)       Balance Overall balance assessment: Modified Independent Sitting-balance support: No upper extremity supported Sitting balance-Leahy Scale: Good                                      Cognition Arousal/Alertness: Awake/alert Behavior During Therapy: WFL for tasks assessed/performed Overall Cognitive Status: Within Functional Limits for tasks assessed                                 General Comments: oriented to self, location; follows simple commands, but requires significant encouragement for participation with task (consistently attempting return to supine)      Exercises General Exercises - Lower Extremity Ankle Circles/Pumps: Strengthening;AROM;Both;15 reps;Seated Long Arc Quad: AROM;Both;15 reps Hip ABduction/ADduction: AROM;Left;15 reps;Seated Hip Flexion/Marching: AROM;Both;10 reps;Seated Toe Raises: AROM;Both;15 reps Heel Raises: AROM;Both;15 reps    General Comments        Pertinent Vitals/Pain Pain Assessment: No/denies pain    Home Living                      Prior Function  PT Goals (current goals can now be found in the care plan section) Progress towards PT goals: Progressing toward goals    Frequency    Min 2X/week      PT Plan Current plan remains appropriate    Co-evaluation PT/OT/SLP Co-Evaluation/Treatment: Yes   PT goals addressed during session: Mobility/safety with mobility;Strengthening/ROM;Proper use of DME        AM-PAC PT "6 Clicks" Mobility   Outcome Measure  Help needed turning from your back to your side while in a flat bed without using bedrails?: A Little Help needed moving from lying on your  back to sitting on the side of a flat bed without using bedrails?: A Lot Help needed moving to and from a bed to a chair (including a wheelchair)?: A Lot Help needed standing up from a chair using your arms (e.g., wheelchair or bedside chair)?: A Little Help needed to walk in hospital room?: A Little Help needed climbing 3-5 steps with a railing? : Total 6 Click Score: 14    End of Session Equipment Utilized During Treatment: Gait belt Activity Tolerance: Patient limited by fatigue Patient left: in chair;with call bell/phone within reach;with chair alarm set Nurse Communication: Mobility status PT Visit Diagnosis: Muscle weakness (generalized) (M62.81);Difficulty in walking, not elsewhere classified (R26.2)     Time: 1005-1030 PT Time Calculation (min) (ACUTE ONLY): 25 min  Charges:  $Therapeutic Exercise: 8-22 mins $Therapeutic Activity: 8-22 mins                     Mikel Cella, PTA   Josie Dixon 11/30/2019, 11:08 AM

## 2019-12-01 NOTE — Discharge Summary (Signed)
[place] [facility] [place] NAME: Darlene Stafford    MR#:  [id number]  DATE OF BIRTH:  08/08/1950  DATE OF ADMISSION:  11/27/2019   ADMITTING PHYSICIAN: Rhetta Mura, DO  DATE OF DISCHARGE: 11/30/2019  2:26 PM  PRIMARY CARE PHYSICIAN: Katherina Mires, MD   ADMISSION DIAGNOSIS:  Acute metabolic encephalopathy [W09.81] Sepsis, due to unspecified organism, unspecified whether acute organ dysfunction present ([place]) [A41.9] DISCHARGE DIAGNOSIS:  Principal Problem:   Acute metabolic encephalopathy Active Problems:   CAP (community acquired pneumonia)   Diabetes mellitus without complication ([place])   Asthma   Hypothyroidism   Severe sepsis ([place])   Fever   Generalized weakness  SECONDARY DIAGNOSIS:   Past Medical History:  Diagnosis Date  . Asthma   . CVA (cerebral vascular accident) ([place])   . Diabetes mellitus without complication ([place])   . Sarcoidosis of other sites    [place]:  69 year old female with a known history of asthma, acquired hypothyroidism, allergic rhinitis, stage IIIa CKD (baseline Cr 1.1-1.3), type 2 diabetes mellitus (last A1c 5.0%), sarcoidosis admitted with acute metabolic encephalopathyafter presenting to the ED from home for evaluation of confusion.  Evaluation in the ED was consistent with severe sepsis secondary to community-acquitted pneumonia.    Severe sepsis present on admission due to community-acquired pneumonia Treated with IV antibiotics Sepsis resolved.  Pneumonia improving.  Acute metabolic encephalopathy present on admission due to sepsis Now resolved  Diarrhea: Likely viral.  Now resolved.  Negative C. Difficile  Transient hypoglycemia: Likely due to poor p.o. intake.  Now resolved.  Hypomagnesemia: Repleted and resolved  Rhabdomyolysis: Improved with hydration  Acute kidney injury: present on admission.  Cr 1.33 >> 1.53>>2.04>>1.2.    Due to ATN from sepsis and hypotension.  Now back  to baseline with hydration and treatment of sepsis  Moderate persistent asthma: Continue inhalers, Singulair, low dose daily prednisone increased to 20 mg. Follows at [facility] pulmonology with Dr. Halford Chessman. Currently asthma does not appear exacerbated.  No wheezing on exam.  Hx of sarcoidosis -follow-up outpatient with pulmonology. continue chronic steroids.  Type 2 diabetes -had hypoglycemic event while here which was resolved  Acquired Hypothyroidism - continue levothyroxine.  TSH normal.  Allergic rhinitis - continue Flonase  Obesity: Body mass index is 43.27 kg/m.  Complicates overall care and prognosis.  DISCHARGE CONDITIONS:  Stable CONSULTS OBTAINED:   DRUG ALLERGIES:   Allergies  Allergen Reactions  . Fish Allergy Anaphylaxis  . Shellfish Allergy Anaphylaxis  . Sulfa Antibiotics Hives and Itching   DISCHARGE MEDICATIONS:   Allergies as of 11/30/2019      Reactions   Fish Allergy Anaphylaxis   Shellfish Allergy Anaphylaxis   Sulfa Antibiotics Hives, Itching      Medication List    STOP taking these medications   amLODipine 10 MG tablet Commonly known as: NORVASC     TAKE these medications   albuterol 108 (90 Base) MCG/ACT inhaler Commonly known as: VENTOLIN HFA INHALE 1 TO 2 PUFFS BY MOUTH EVERY 4 HOURS AS NEEDED FOR SHORTNESS OF BREATH What changed: See the new instructions.   Arnuity Ellipta 200 MCG/ACT Aepb Generic drug: Fluticasone Furoate Inhale 1 puff into the lungs daily.   atorvastatin 40 MG tablet Commonly known as: LIPITOR Take 40 mg by mouth daily.   azithromycin 500 MG tablet Commonly known as: Zithromax Take 1 tablet (500 mg total) by mouth daily for 3 days.  Take 1 tablet daily for 3 days.   carvedilol 12.5 MG tablet Commonly known as: COREG Take 12.5 mg by mouth 2 (two) times daily with a meal.   cetirizine 10 MG tablet Commonly known as: ZYRTEC Take 10 mg by mouth daily as needed for allergies.   escitalopram 10 MG  tablet Commonly known as: LEXAPRO Take 10 mg by mouth daily.   fluticasone 50 MCG/ACT nasal spray Commonly known as: FLONASE Place 2 sprays into both nostrils daily.   guaiFENesin 600 MG 12 hr tablet Commonly known as: MUCINEX Take 1 tablet (600 mg total) by mouth 2 (two) times daily. What changed:  when to take this reasons to take this   levothyroxine 25 MCG tablet Commonly known as: SYNTHROID Take 1 tablet (25 mcg total) by mouth daily at 6 (six) AM.   meloxicam 7.5 MG tablet Commonly known as: MOBIC Take 7.5 mg by mouth daily.   montelukast 10 MG tablet Commonly known as: SINGULAIR Take 1 tablet (10 mg total) by mouth at bedtime.   omeprazole 20 MG capsule Commonly known as: PRILOSEC Take 20 mg by mouth daily.   ondansetron 4 MG tablet Commonly known as: Zofran Take 1 tablet (4 mg total) by mouth every 8 (eight) hours as needed for nausea or vomiting.   predniSONE 5 MG tablet Commonly known as: DELTASONE Take 0.5 tablets (2.5 mg total) by mouth daily at 6 (six) AM.   Vitamin D (Ergocalciferol) 1.25 MG (50000 UNIT) Caps capsule Commonly known as: DRISDOL Take 50,000 Units by mouth every 7 (seven) days.      DISCHARGE INSTRUCTIONS:   DIET:  Cardiac diet DISCHARGE CONDITION:  Stable ACTIVITY:  Activity as tolerated OXYGEN:  Home Oxygen: No.  Oxygen Delivery: room air DISCHARGE LOCATION:  home -please note patient was recommended SNF per PT, OT evaluation but patient refused SNF placement she also refused home health services.  If you experience worsening of your admission symptoms, develop shortness of breath, life threatening emergency, suicidal or homicidal thoughts you must seek medical attention immediately by calling 911 or calling your MD immediately  if symptoms less severe.  You Must read complete instructions/literature along with all the possible adverse reactions/side effects for all the Medicines you take and that have been prescribed to you.  Take any new Medicines after you have completely understood and accpet all the possible adverse reactions/side effects.   Please note  You were cared for by a hospitalist during your hospital stay. If you have any questions about your discharge medications or the care you received while you were in the hospital after you are discharged, you can call the unit and asked to speak with the hospitalist on call if the hospitalist that took care of you is not available. Once you are discharged, your primary care physician will handle any further medical issues. Please note that NO REFILLS for any discharge medications will be authorized once you are discharged, as it is imperative that you return to your primary care physician (or establish a relationship with a primary care physician if you do not have one) for your aftercare needs so that they can reassess your need for medications and monitor your lab values.    On the day of Discharge:  VITAL SIGNS:  Blood pressure (!) 144/67, pulse 85, temperature 98.5 F (36.9 C), temperature source Oral, resp. rate 18, height 5\' 1"  (1.549 m), weight 103.9 kg, SpO2 98 %. PHYSICAL EXAMINATION:  GENERAL:  69 y.o.-year-old patient lying in  the bed with no acute distress.  EYES: Pupils equal, round, reactive to light and accommodation. No scleral icterus. Extraocular muscles intact.  HEENT: Head atraumatic, normocephalic. Oropharynx and nasopharynx clear.  NECK:  Supple, no jugular venous distention. No thyroid enlargement, no tenderness.  LUNGS: Normal breath sounds bilaterally, no wheezing, rales,rhonchi or crepitation. No use of accessory muscles of respiration.  CARDIOVASCULAR: S1, S2 normal. No murmurs, rubs, or gallops.  ABDOMEN: Soft, non-tender, non-distended. Bowel sounds present. No organomegaly or mass.  EXTREMITIES: No pedal edema, cyanosis, or clubbing.  NEUROLOGIC: Cranial nerves II through XII are intact. Muscle strength 5/5 in all extremities.  Sensation intact. Gait not checked.  PSYCHIATRIC: The patient is alert and oriented x 3.  SKIN: No obvious rash, lesion, or ulcer.  DATA REVIEW:   CBC Recent Labs  Lab 11/30/19 0441  WBC 4.7  HGB 9.1*  HCT 26.9*  PLT 210    Chemistries  Recent Labs  Lab 11/30/19 0441  NA 139  K 3.6  CL 108  CO2 24  GLUCOSE 125*  BUN 9  CREATININE 1.20*  CALCIUM 8.0*  MG 1.9  AST 27  ALT 15  ALKPHOS 45  BILITOT 0.4     Outpatient follow-up  Follow-up Information    Katherina Mires, MD. Schedule an appointment as soon as possible for a visit in 1 week(s).   Specialty: Family Medicine Contact information: [place] [address] [phone]                Management plans discussed with the patient, family and they are in agreement.  CODE STATUS: Prior   TOTAL TIME TAKING CARE OF THIS PATIENT: 45 minutes.    Max Sane M.D on 12/01/2019 at 4:49 PM  [facility]   CC: Primary care physician; Katherina Mires, MD   Note: This dictation was prepared with Dragon dictation along with smaller phrase technology. Any transcriptional errors that result from this process are unintentional.

## 2019-12-02 LAB — CULTURE, BLOOD (ROUTINE X 2)
Culture: NO GROWTH
Culture: NO GROWTH
Special Requests: ADEQUATE

## 2019-12-12 ENCOUNTER — Other Ambulatory Visit: Payer: Self-pay | Admitting: Adult Health

## 2019-12-13 ENCOUNTER — Telehealth: Payer: Self-pay | Admitting: Pulmonary Disease

## 2019-12-13 MED ORDER — ALBUTEROL SULFATE HFA 108 (90 BASE) MCG/ACT IN AERS
INHALATION_SPRAY | RESPIRATORY_TRACT | 2 refills | Status: DC
Start: 2019-12-13 — End: 2020-07-03

## 2019-12-13 NOTE — Telephone Encounter (Signed)
Spoke with pt  I have refilled her albuterol inhaler and encouraged her to keep next appt for refills  Nothing further needed

## 2019-12-20 ENCOUNTER — Encounter: Payer: Self-pay | Admitting: Pulmonary Disease

## 2019-12-20 ENCOUNTER — Ambulatory Visit (INDEPENDENT_AMBULATORY_CARE_PROVIDER_SITE_OTHER): Payer: Medicare Other | Admitting: Pulmonary Disease

## 2019-12-20 ENCOUNTER — Other Ambulatory Visit: Payer: Self-pay

## 2019-12-20 DIAGNOSIS — D860 Sarcoidosis of lung: Secondary | ICD-10-CM | POA: Diagnosis not present

## 2019-12-20 DIAGNOSIS — D869 Sarcoidosis, unspecified: Secondary | ICD-10-CM

## 2019-12-20 DIAGNOSIS — Z7189 Other specified counseling: Secondary | ICD-10-CM

## 2019-12-20 DIAGNOSIS — J4550 Severe persistent asthma, uncomplicated: Secondary | ICD-10-CM

## 2019-12-20 DIAGNOSIS — R058 Other specified cough: Secondary | ICD-10-CM | POA: Diagnosis not present

## 2019-12-20 NOTE — Progress Notes (Signed)
[facility]  Chief Complaint  Patient presents with   Follow-up    shortness of breath with activity (just a little bit) inhaler helping    Constitutional:  There were no vitals taken for this visit.  Past Medical History:  DM, CVA, CKD 3, Gout  Past Surgical History:  Her  has a past surgical history that includes Replacement total knee (2015) and Partial hysterectomy (1998).  Brief Summary:  Darlene Stafford is a 69 y.o. female with sarcoidosis on chronic prednisone, allergic asthma, and upper airway cough syndrome.      Subjective:   Virtual Visit via Telephone Note  I connected with [name] on 12/20/19 at 11:30 AM EST by telephone and verified that I am speaking with the correct person using two identifiers.  Location: Patient: home Provider: medical office   I discussed the limitations, risks, security and privacy concerns of performing an evaluation and management service by telephone and the availability of in person appointments. I also discussed with the patient that there may be a patient responsible charge related to this service. The patient expressed understanding and agreed to proceed.  She was in hospital in October for pneumonia, AKI, encephalopathy.  Doing well.  Not having much cough.  Denies fever, wheeze, sputum, hemoptysis, or chest pain.  Feels arnuity has helped.    CXR from 11/27/19 showed chronic changes of sarcoidosis impacted Rt lung > Lt lung.   Physical Exam:   Deferred.   Pulmonary testing:   RAST 09/30/16 >> multiple allergens, IgE 3120  PFT 11/10/16 >> FEV1 1.13 (73%), FEV1% 92, TLC 3.12 (71%), DLCO 76%, no BD  Chest Imaging:   HRCT chest 11/03/16 >> patchy septal thickening, honeycombing, and bronchiectasis  CT chest 04/12/18 >> atherosclerosis, calcified mediastinal LN, honeycombing RUL, patchy LUL ASD, honeycombing posterior Lt lung, calcified granuloma in spleen, renal  stones  Cardiac Tests:   Echo 03/20/18 >> EF greater than 65%, normal RV systolic function  Social History:  She  reports that she has never smoked. She has never used smokeless tobacco. She reports that she does not drink alcohol and does not use drugs.  Family History:  Her family history includes Breast cancer in her sister; Sarcoidosis in her sister, sister, and sister.      Assessment/Plan:   Systemic sarcoidosis. - stable - she would like to wait until after the holidays before considering further transition off of prednisone - she is currently on 2.5 mg prednisone daily  Allergic asthma with elevated IgE. - doing better since arnuity was added - continue arnuity, singulair - prn albuterol  Upper airway cough syndrome with post nasal drip. - continue flonase, zyrtec, singulair  COVID 19 advice. - she received J and J vaccine - advised her to get booster, and explained that she could get [place] or [employer] vaccine as booster if J and J vaccine not available  Time Spent Involved in Patient Care on Day of Examination:    I discussed the assessment and treatment plan with the patient. The patient was provided an opportunity to ask questions and all were answered. The patient agreed with the plan and demonstrated an understanding of the instructions.   The patient was advised to call back or seek an in-person evaluation if the symptoms worsen or if the condition fails to improve as anticipated.  I provided 13 minutes of non-face-to-face time during this encounter.   Follow up:  Patient Instructions  Follow up in  3 months   Medication List:   Allergies as of 12/20/2019      Reactions   Fish Allergy Anaphylaxis   Shellfish Allergy Anaphylaxis   Sulfa Antibiotics Hives, Itching      Medication List       Accurate as of December 20, 2019 11:52 AM. If you have any questions, ask your nurse or doctor.        albuterol 108 (90 Base) MCG/ACT inhaler Commonly  known as: VENTOLIN HFA INHALE 1 TO 2 PUFFS BY MOUTH EVERY 4 HOURS AS NEEDED FOR SHORTNESS OF BREATH   Arnuity Ellipta 200 MCG/ACT Aepb Generic drug: Fluticasone Furoate Inhale 1 puff into the lungs daily.   atorvastatin 40 MG tablet Commonly known as: LIPITOR Take 40 mg by mouth daily.   carvedilol 12.5 MG tablet Commonly known as: COREG Take 12.5 mg by mouth 2 (two) times daily with a meal.   cetirizine 10 MG tablet Commonly known as: ZYRTEC Take 10 mg by mouth daily as needed for allergies.   escitalopram 10 MG tablet Commonly known as: LEXAPRO Take 10 mg by mouth daily.   fluticasone 50 MCG/ACT nasal spray Commonly known as: FLONASE Place 2 sprays into both nostrils daily.   guaiFENesin 600 MG 12 hr tablet Commonly known as: MUCINEX Take 1 tablet (600 mg total) by mouth 2 (two) times daily. What changed:   when to take this  reasons to take this   levothyroxine 25 MCG tablet Commonly known as: SYNTHROID Take 1 tablet (25 mcg total) by mouth daily at 6 (six) AM.   meloxicam 7.5 MG tablet Commonly known as: MOBIC Take 7.5 mg by mouth daily.   montelukast 10 MG tablet Commonly known as: SINGULAIR Take 1 tablet (10 mg total) by mouth at bedtime.   omeprazole 20 MG capsule Commonly known as: PRILOSEC Take 20 mg by mouth daily.   ondansetron 4 MG tablet Commonly known as: Zofran Take 1 tablet (4 mg total) by mouth every 8 (eight) hours as needed for nausea or vomiting.   predniSONE 5 MG tablet Commonly known as: DELTASONE Take 0.5 tablets (2.5 mg total) by mouth daily at 6 (six) AM.   Vitamin D (Ergocalciferol) 1.25 MG (50000 UNIT) Caps capsule Commonly known as: DRISDOL Take 50,000 Units by mouth every 7 (seven) days.       Signature:  Chesley Mires, MD [place] Pager - [phone] 12/20/2019, 11:52 AM

## 2019-12-20 NOTE — Patient Instructions (Signed)
Follow up in 3 months

## 2020-01-26 ENCOUNTER — Other Ambulatory Visit: Payer: Self-pay | Admitting: Pulmonary Disease

## 2020-01-30 ENCOUNTER — Telehealth: Payer: Self-pay | Admitting: Pulmonary Disease

## 2020-01-30 MED ORDER — ARNUITY ELLIPTA 200 MCG/ACT IN AEPB: INHALATION_SPRAY | RESPIRATORY_TRACT | 5 refills | Status: AC

## 2020-01-30 NOTE — Telephone Encounter (Signed)
Called and spoke with patient who verified medication that needs to be refilled and preferred pharmacy. Refill has been sent in. Nothing further needed at this time.

## 2020-05-03 ENCOUNTER — Telehealth: Payer: Self-pay | Admitting: Pulmonary Disease

## 2020-05-03 NOTE — Telephone Encounter (Signed)
Called and spoke to patient who stated she would like to get her ov on 05/08/20 switched to a televisit.  Patient's daughter just had a baby and she is the one who usually transports patient to appointments due to patient unable to drive self and will not be able to bring patient.  Dr Halford Chessman please advise if this ok.

## 2020-05-03 NOTE — Telephone Encounter (Signed)
Okay to switch to video or telephone visit.

## 2020-05-03 NOTE — Telephone Encounter (Signed)
Called and spoke with patient about Dr Juanetta Gosling response. Appointment changed to televisit. Patient agreeable and aware. Nothing further needed at this time.

## 2020-05-08 ENCOUNTER — Other Ambulatory Visit: Payer: Self-pay

## 2020-05-08 ENCOUNTER — Encounter: Payer: Self-pay | Admitting: Pulmonary Disease

## 2020-05-08 ENCOUNTER — Ambulatory Visit (INDEPENDENT_AMBULATORY_CARE_PROVIDER_SITE_OTHER): Payer: Medicare Other | Admitting: Pulmonary Disease

## 2020-05-08 DIAGNOSIS — J4550 Severe persistent asthma, uncomplicated: Secondary | ICD-10-CM

## 2020-05-08 DIAGNOSIS — R058 Other specified cough: Secondary | ICD-10-CM

## 2020-05-08 DIAGNOSIS — D869 Sarcoidosis, unspecified: Secondary | ICD-10-CM | POA: Diagnosis not present

## 2020-05-08 MED ORDER — PREDNISONE 1 MG PO TABS: 2.0000 mg | ORAL_TABLET | Freq: Every day | ORAL | 3 refills | Status: AC

## 2020-05-08 NOTE — Progress Notes (Signed)
[facility]  Chief Complaint  Patient presents with  . Follow-up    No complaints currently    Constitutional:  There were no vitals taken for this visit.  Past Medical History:  DM, CVA, CKD 3, Gout  Past Surgical History:  Her  has a past surgical history that includes Replacement total knee (2015) and Partial hysterectomy (1998).  Brief Summary:  Darlene Stafford is a 70 y.o. female with sarcoidosis on chronic prednisone, allergic asthma, and upper airway cough syndrome.      Subjective:   Virtual Visit via Telephone Note  I connected with Darlene Stafford on 05/08/20 at  9:00 AM EDT by telephone and verified that I am speaking with the correct person using two identifiers.  Location: Patient: home Provider: medical office   I discussed the limitations, risks, security and privacy concerns of performing an evaluation and management service by telephone and the availability of in person appointments. I also discussed with the patient that there may be a patient responsible charge related to this service. The patient expressed understanding and agreed to proceed.  She had another great grand child.  She is excited about this, and the kids help keep her going.  She was surprised how much arnuity cost this time when she got a refill ($160).    Breathing doing well.  No having cough, wheeze, sputum, fever, chest pain, or hemoptysis.   Physical Exam:   Deferred.   Pulmonary testing:   RAST 09/30/16 >> multiple allergens, IgE 3120  PFT 11/10/16 >> FEV1 1.13 (73%), FEV1% 92, TLC 3.12 (71%), DLCO 76%, no BD  Chest Imaging:   HRCT chest 11/03/16 >> patchy septal thickening, honeycombing, and bronchiectasis  CT chest 04/12/18 >> atherosclerosis, calcified mediastinal LN, honeycombing RUL, patchy LUL ASD, honeycombing posterior Lt lung, calcified granuloma in spleen, renal stones  Cardiac Tests:   Echo 03/20/18 >> EF greater than  65%, normal RV systolic function  Social History:  She  reports that she has never smoked. She has never used smokeless tobacco. She reports that she does not drink alcohol and does not use drugs.  Family History:  Her family history includes Breast cancer in her sister; Sarcoidosis in her sister, sister, and sister.      Assessment/Plan:   Systemic sarcoidosis. - will change prednisone to 2 mg daily; she has been on steroids for quite some time, and therefore will need a very slow tapering process  Allergic asthma with elevated IgE. - continue arnuity 200 one puff daily - continue singulair - prn albuterol - she will review her insurance formulary to determine if there is a less expensive option to arnuity  Upper airway cough syndrome with post nasal drip. - continue flonase, zyrtec, and singulair   Time Spent Involved in Patient Care on Day of Examination:    I discussed the assessment and treatment plan with the patient. The patient was provided an opportunity to ask questions and all were answered. The patient agreed with the plan and demonstrated an understanding of the instructions.   The patient was advised to call back or seek an in-person evaluation if the symptoms worsen or if the condition fails to improve as anticipated.  I provided 14 minutes of non-face-to-face time during this encounter.   Follow up:  There are no Patient Instructions on file for this visit.  Medication List:   Allergies as of 05/08/2020      Reactions   Fish Allergy  Anaphylaxis   Shellfish Allergy Anaphylaxis   Sulfa Antibiotics Hives, Itching      Medication List       Accurate as of May 08, 2020  9:09 AM. If you have any questions, ask your nurse or doctor.        STOP taking these medications   levothyroxine 25 MCG tablet Commonly known as: SYNTHROID Stopped by: Chesley Mires, MD     TAKE these medications   albuterol 108 (90 Base) MCG/ACT inhaler Commonly known as:  VENTOLIN HFA INHALE 1 TO 2 PUFFS BY MOUTH EVERY 4 HOURS AS NEEDED FOR SHORTNESS OF BREATH   Arnuity Ellipta 200 MCG/ACT Aepb Generic drug: Fluticasone Furoate Inhale 1 puff by mouth once daily   atorvastatin 40 MG tablet Commonly known as: LIPITOR Take 40 mg by mouth daily.   carvedilol 12.5 MG tablet Commonly known as: COREG Take 12.5 mg by mouth 2 (two) times daily with a meal.   cetirizine 10 MG tablet Commonly known as: ZYRTEC Take 10 mg by mouth daily as needed for allergies.   escitalopram 10 MG tablet Commonly known as: LEXAPRO Take 10 mg by mouth daily.   fluticasone 50 MCG/ACT nasal spray Commonly known as: FLONASE Place 2 sprays into both nostrils daily.   guaiFENesin 600 MG 12 hr tablet Commonly known as: MUCINEX Take 1 tablet (600 mg total) by mouth 2 (two) times daily. What changed:   when to take this  reasons to take this   meloxicam 7.5 MG tablet Commonly known as: MOBIC Take 7.5 mg by mouth daily.   montelukast 10 MG tablet Commonly known as: SINGULAIR Take 1 tablet (10 mg total) by mouth at bedtime.   omeprazole 20 MG capsule Commonly known as: PRILOSEC Take 20 mg by mouth daily.   ondansetron 4 MG tablet Commonly known as: Zofran Take 1 tablet (4 mg total) by mouth every 8 (eight) hours as needed for nausea or vomiting.   predniSONE 1 MG tablet Commonly known as: DELTASONE Take 2 tablets (2 mg total) by mouth daily with breakfast. What changed:   medication strength  how much to take  when to take this Changed by: Chesley Mires, MD   Vitamin D (Ergocalciferol) 1.25 MG (50000 UNIT) Caps capsule Commonly known as: DRISDOL Take 50,000 Units by mouth every 7 (seven) days.       Signature:  Chesley Mires, MD [place] Pager - [phone] 05/08/2020, 9:09 AM

## 2020-05-08 NOTE — Patient Instructions (Signed)
Change prednisone to 2 mg daily  Check your insurance formulary to determine if there is a more affordable alternative to arnuity  Follow up in 4 months

## 2020-07-02 ENCOUNTER — Other Ambulatory Visit: Payer: Self-pay | Admitting: Pulmonary Disease

## 2020-08-09 ENCOUNTER — Other Ambulatory Visit: Payer: Self-pay | Admitting: Pulmonary Disease

## 2020-08-09 ENCOUNTER — Telehealth: Payer: Self-pay | Admitting: Pulmonary Disease

## 2020-08-09 MED ORDER — ALBUTEROL SULFATE HFA 108 (90 BASE) MCG/ACT IN AERS: INHALATION_SPRAY | RESPIRATORY_TRACT | 5 refills | Status: AC

## 2020-08-09 NOTE — Telephone Encounter (Signed)
I called and spoke with patient regarding message. Patient is needing a refill on albuterol as she is going on a trip. Patient was last seen by Dr. Halford Chessman on 04/22. I sent in refill for Albuterol to preferred pharmacy. Patient verbalized understanding, nothing further needed.

## 2020-12-23 IMAGING — CR DG CHEST 2V
2 series · 2 of 2 positions shown · non-contrast
Comparison: 04/30/2017 and older exams.

CLINICAL DATA: Per EMS, pt from home, reports L foot pain, hx of
gout. She also endorses SOB today, with expiratory wheezing in the L
lung. Hx of sarcoidosis PT HX: DM, asthma, non smoker

EXAM:
CHEST - 2 VIEW

[w chest lat]
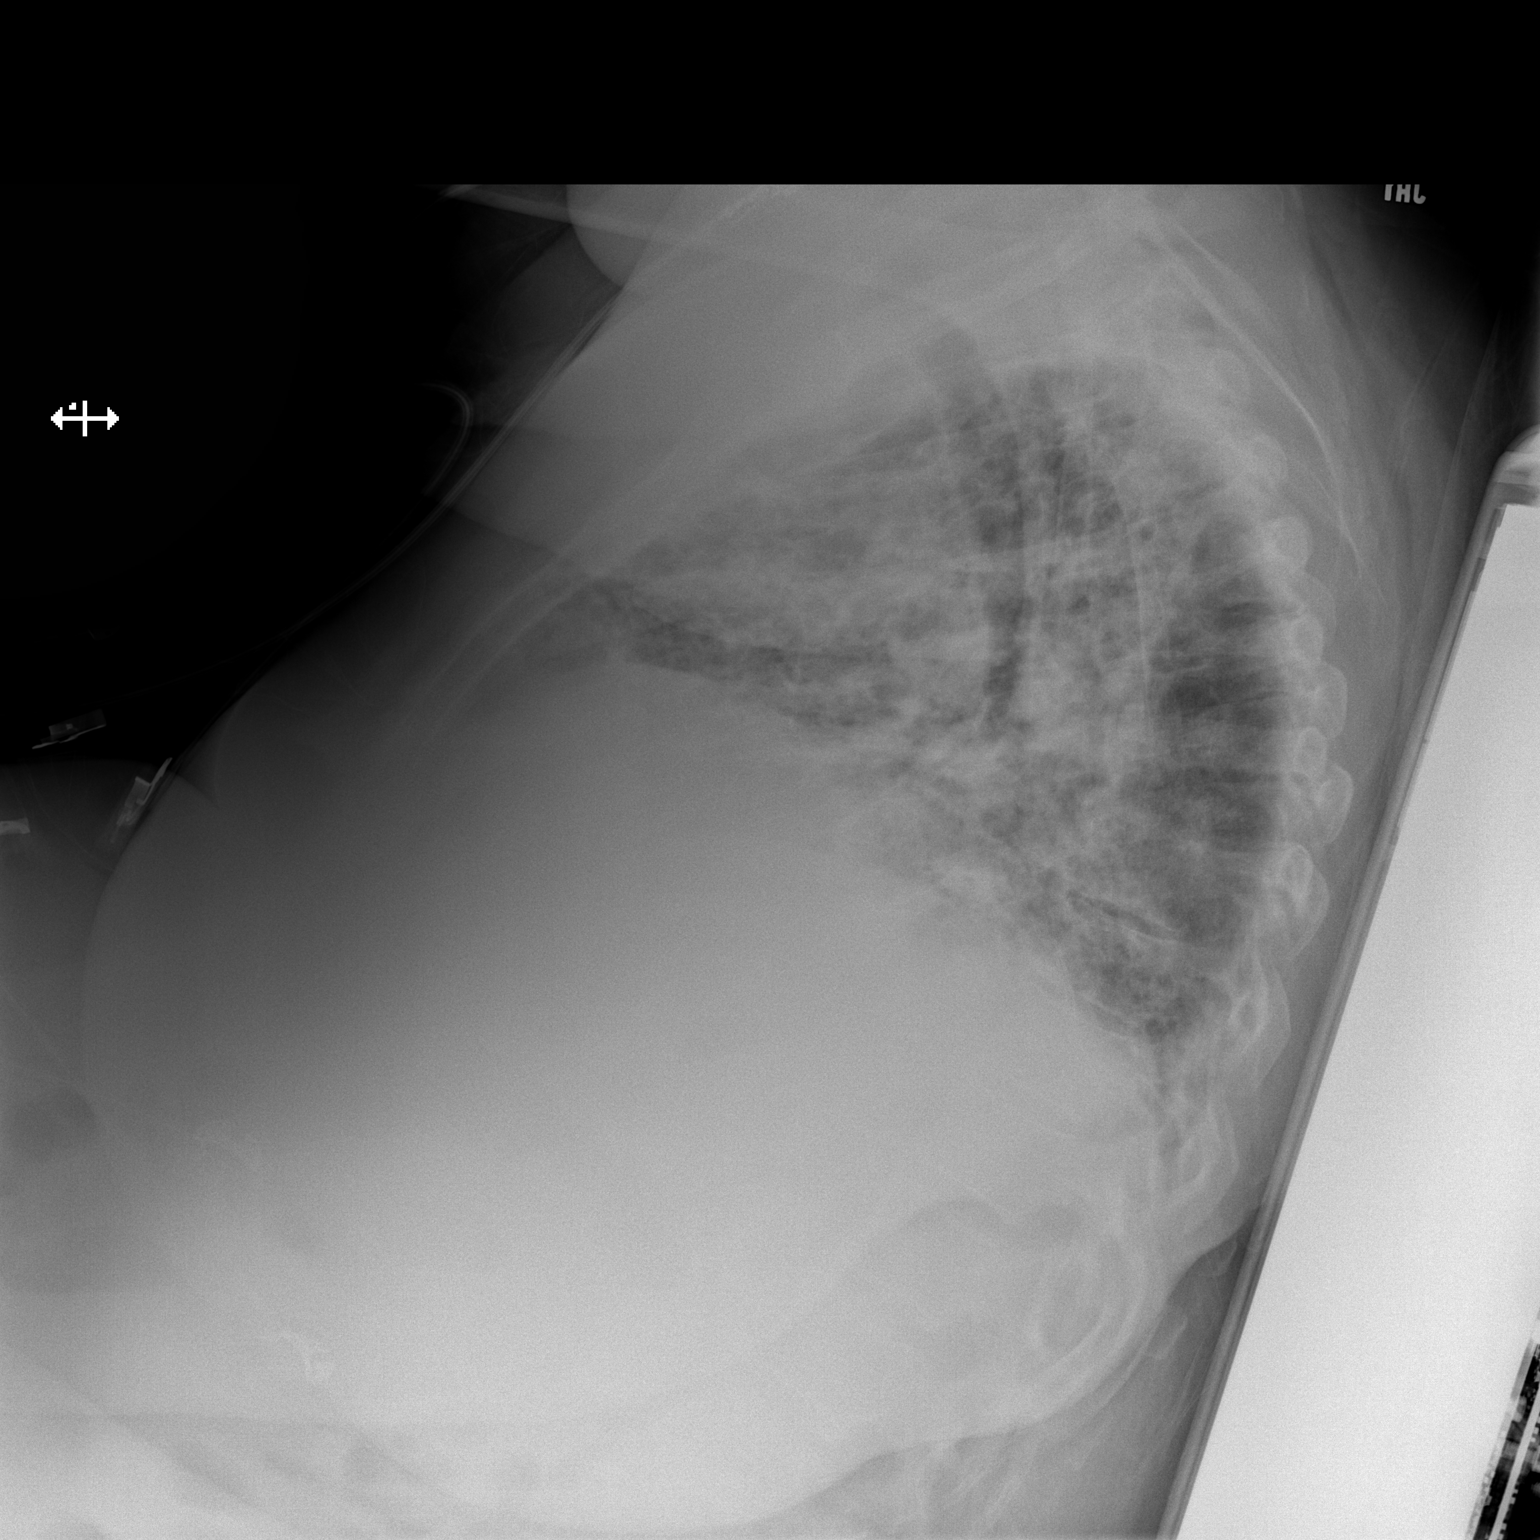

[x chest ap]
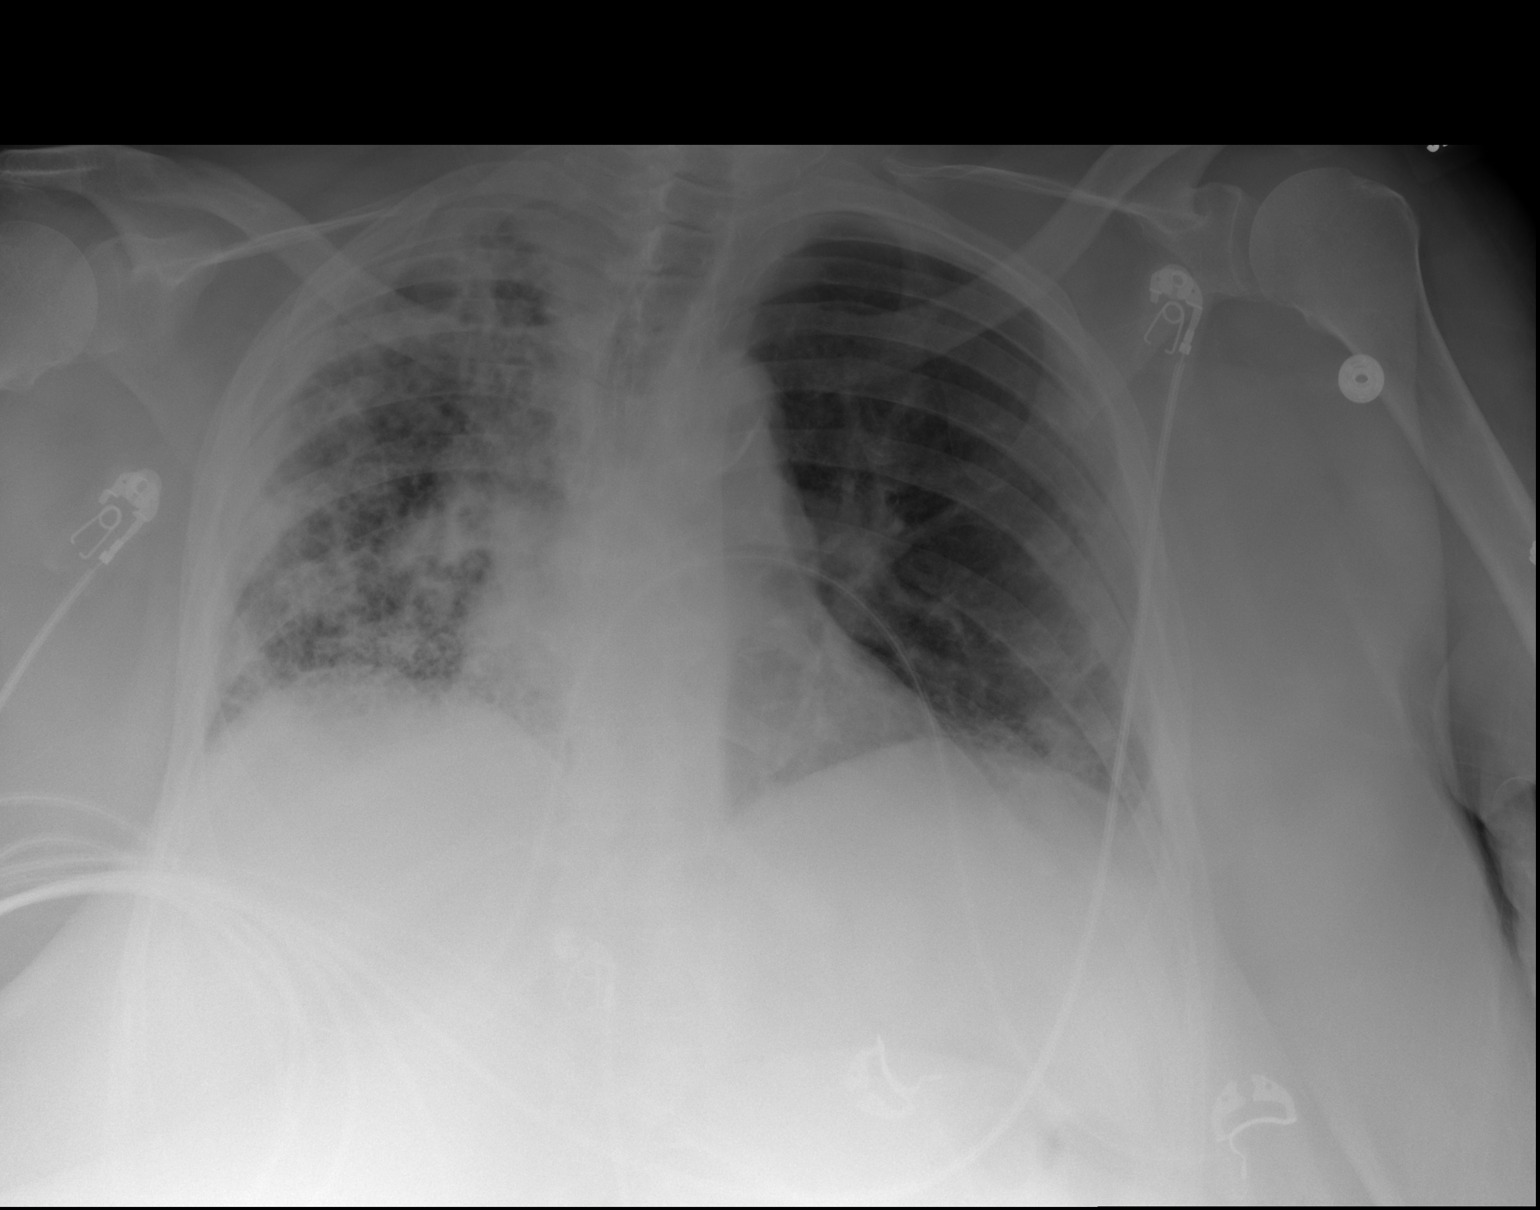

[2 of 2 positions shown; findings below may reference images not displayed]

FINDINGS: Extensive right lung fibrosis with volume loss. Milder fibrosis at
the left lung base. These findings are stable. No acute changes in
the lungs. No pleural effusion or pneumothorax.

Cardiac silhouette is normal in size. No mediastinal or hilar
masses. No convincing adenopathy.

Skeletal structures are intact.
IMPRESSION: 1. No acute cardiopulmonary disease.
2. Stable pulmonary fibrosis, most evident in the right lung.

## 2021-10-04 DEATH — deceased

## 2022-09-17 IMAGING — US US RENAL
1 series · 13 of 25 positions shown · non-contrast
Comparison: 04/12/2018 renal sonogram. 02/03/2017 unenhanced CT
abdomen/pelvis.

CLINICAL DATA: Inpatient.  Acute kidney injury.

EXAM:
RENAL / URINARY TRACT ULTRASOUND COMPLETE

[Series 1: us renal · 49 acquisitions, 13 frames shown]
[im 1/49]
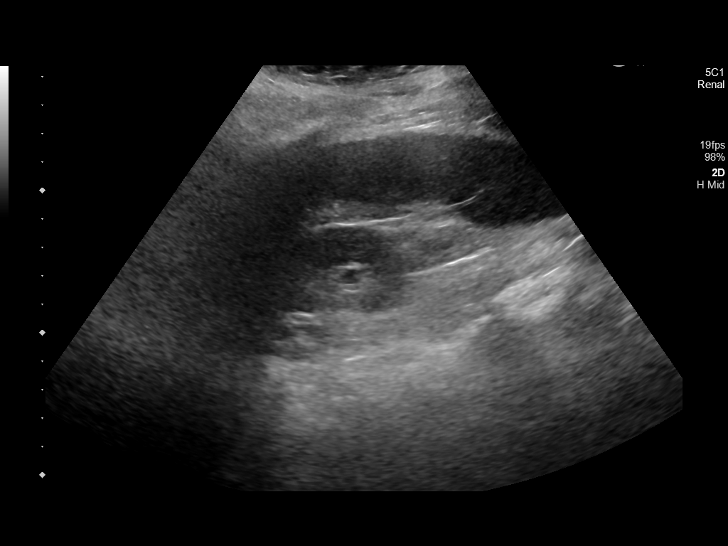
[im 5/49]
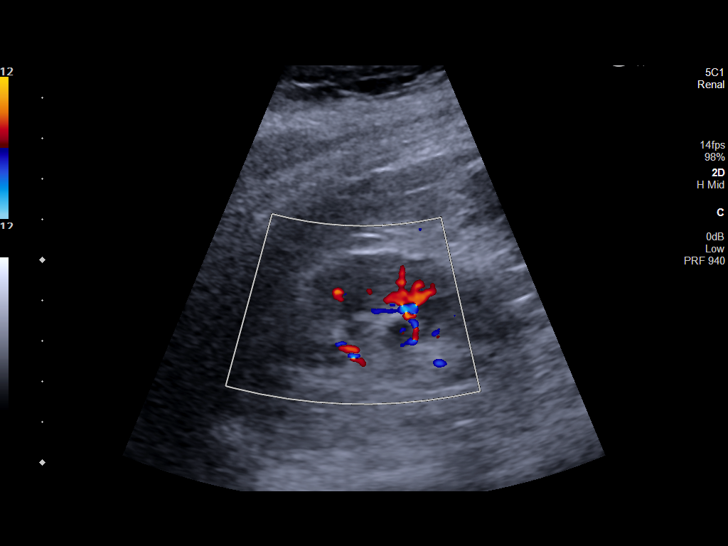
[im 9/49]
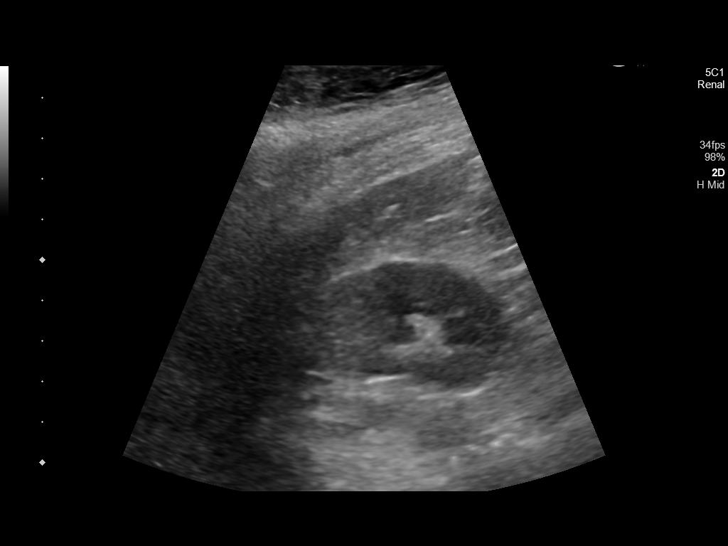
[im 13/49]
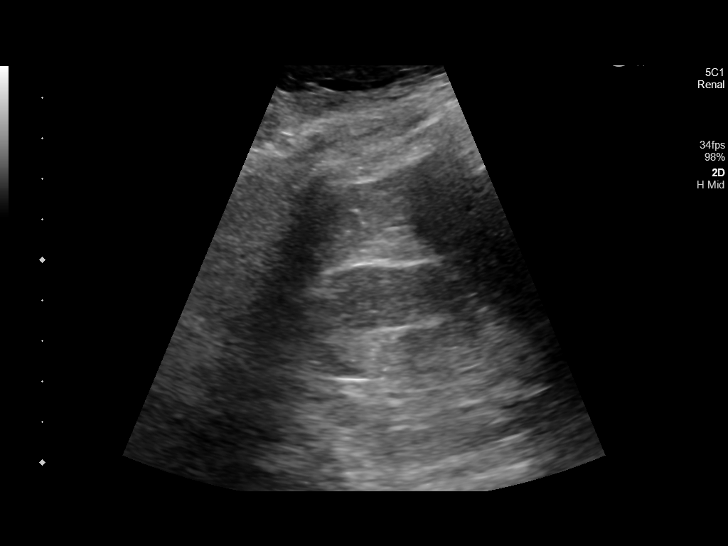
[im 17/49]
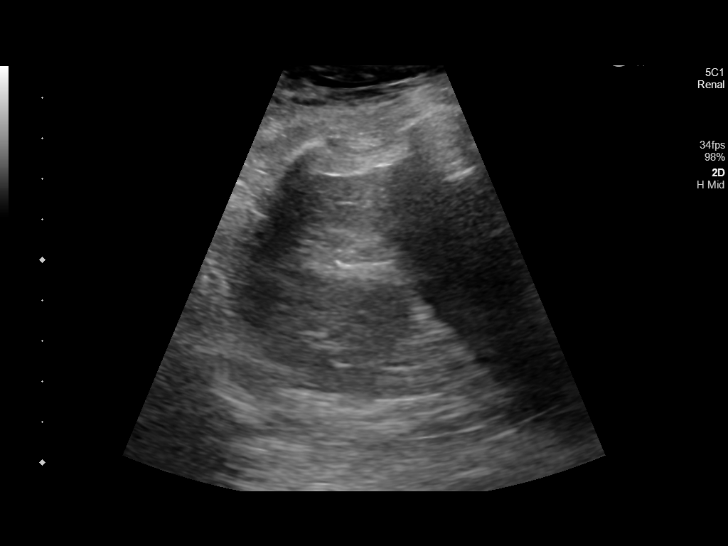
[im 21/49]
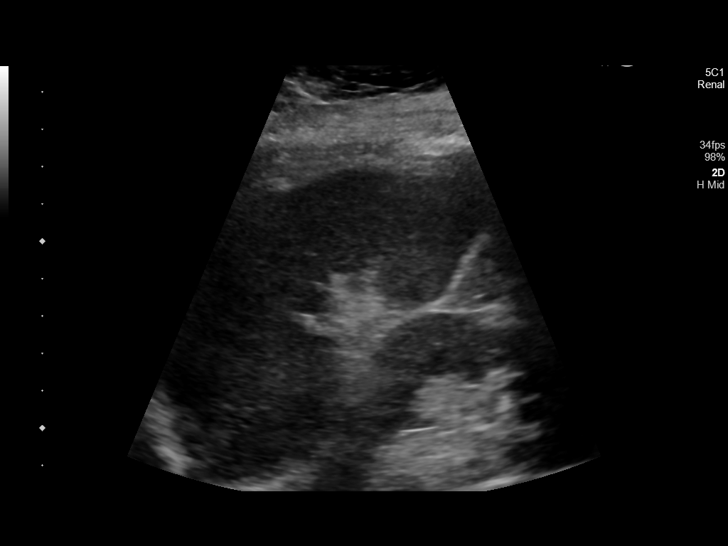
[im 25/49]
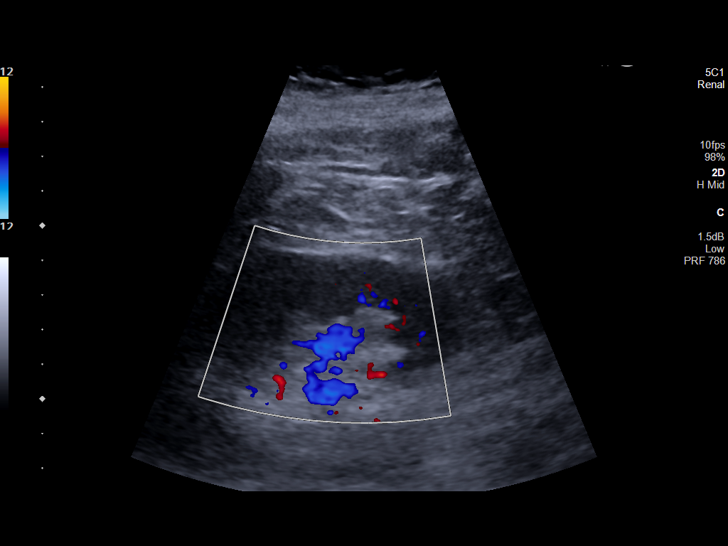
[im 29/49]
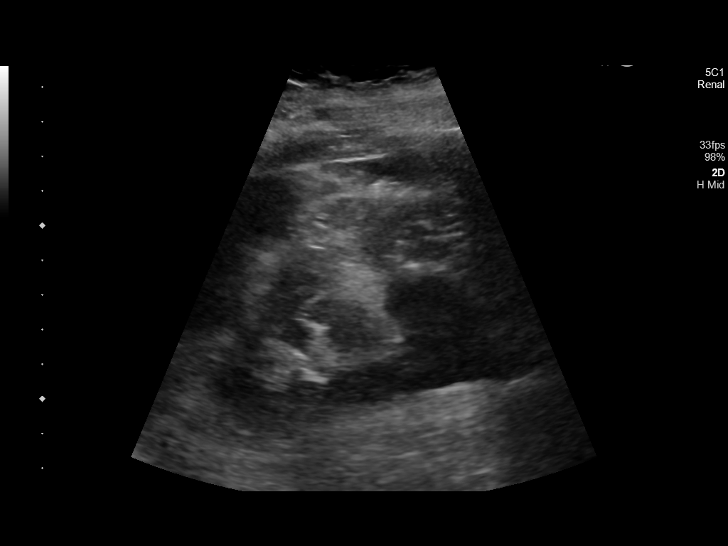
[im 33/49]
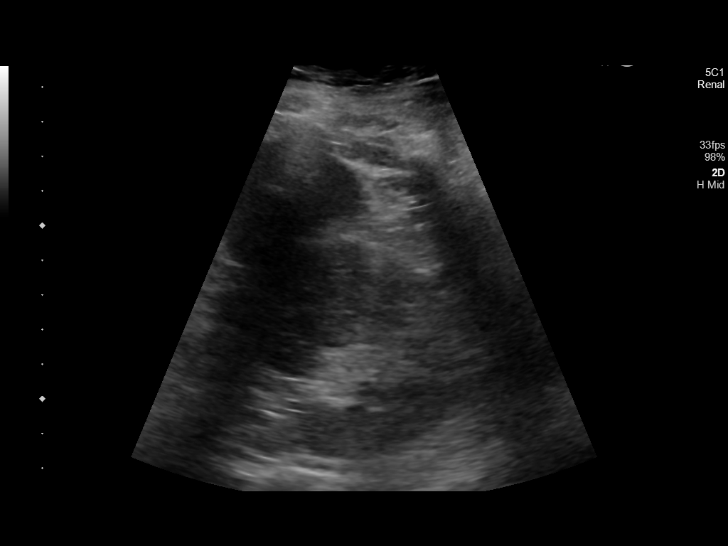
[im 37/49]
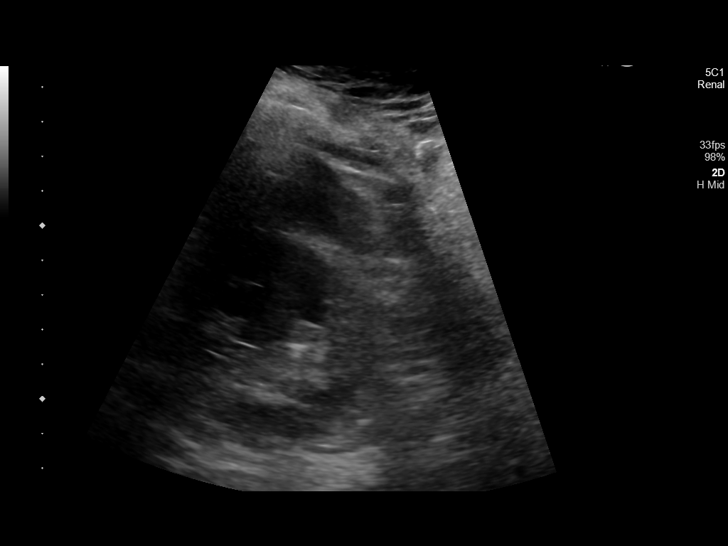
[im 41/49]
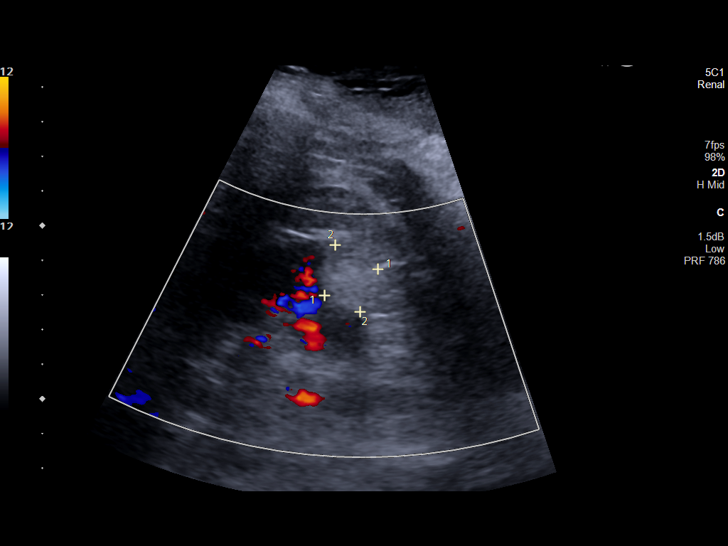
[im 45/49]
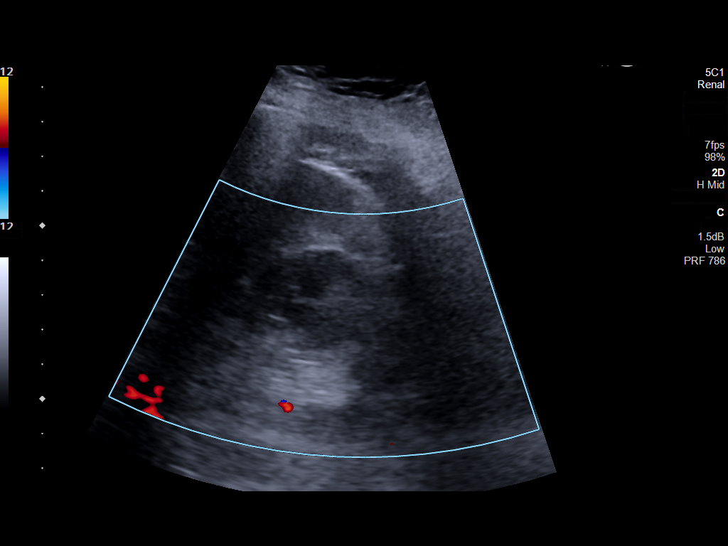
[im 49/49]
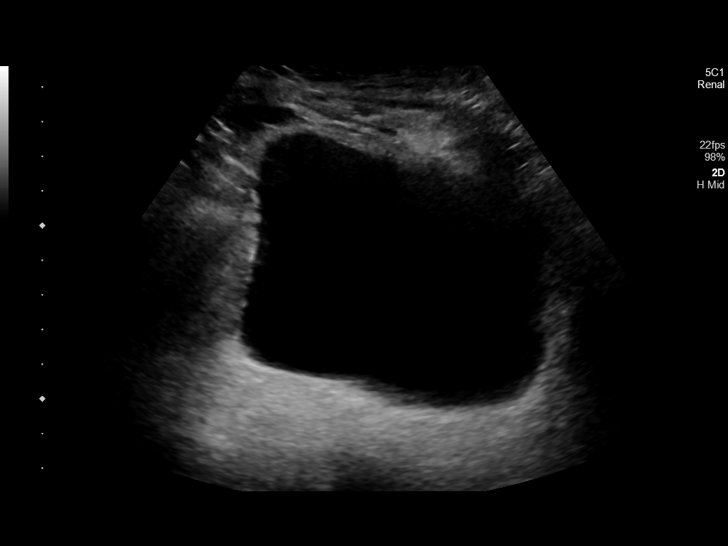

[13 of 25 positions shown; findings below may reference images not displayed]

FINDINGS: Right Kidney:

Renal measurements: 5.9 x 3.5 x 3.1 cm = volume: 33 mL. Echogenic
atrophic right kidney. No stones large enough to cause acoustic
shadowing. Mild caliectasis in the lower right renal collecting
system. No right renal masses.

Left Kidney:

Renal measurements: 9.7 x 5.1 x 5.9 cm = volume: 151 mL. Echogenic
left renal parenchyma, normal thickness. No hydronephrosis. No left
renal mass. Indistinct echogenic focus in perinephric region in the
interpolar left kidney correlates with site of focal renal cortical
scarring seen on 02/03/2017 CT abdomen/pelvis study.

Bladder:

Appears normal for degree of bladder distention.

Other:

None.
IMPRESSION: 1. Atrophic right kidney. Mild caliectasis in the lower right renal
collecting system without pelviectasis.
2. Renal cortical scarring in the interpolar left kidney, with
overall normal size of the left kidney. No left hydronephrosis.
3. Echogenic kidneys bilaterally indicative of nonspecific renal
parenchymal disease of uncertain chronicity, presumably acute or
acute on chronic.
4. Normal bladder.
